# Patient Record
Sex: Female | Born: 1950 | Race: White | Hispanic: No | Marital: Married | Smoking: Never smoker
Health system: Southern US, Community
[De-identification: ages and names within clinical notes are randomized; demographics above are authoritative.]

## PROBLEM LIST (undated history)

## (undated) DIAGNOSIS — R112 Nausea with vomiting, unspecified: Secondary | ICD-10-CM

## (undated) DIAGNOSIS — M1990 Unspecified osteoarthritis, unspecified site: Secondary | ICD-10-CM

## (undated) DIAGNOSIS — Z853 Personal history of malignant neoplasm of breast: Secondary | ICD-10-CM

## (undated) DIAGNOSIS — E785 Hyperlipidemia, unspecified: Secondary | ICD-10-CM

## (undated) DIAGNOSIS — E7800 Pure hypercholesterolemia, unspecified: Secondary | ICD-10-CM

## (undated) DIAGNOSIS — I6521 Occlusion and stenosis of right carotid artery: Secondary | ICD-10-CM

## (undated) DIAGNOSIS — I639 Cerebral infarction, unspecified: Secondary | ICD-10-CM

## (undated) DIAGNOSIS — K603 Anal fistula, unspecified: Secondary | ICD-10-CM

## (undated) DIAGNOSIS — Z87440 Personal history of urinary (tract) infections: Secondary | ICD-10-CM

## (undated) DIAGNOSIS — C50919 Malignant neoplasm of unspecified site of unspecified female breast: Secondary | ICD-10-CM

## (undated) DIAGNOSIS — F419 Anxiety disorder, unspecified: Secondary | ICD-10-CM

## (undated) DIAGNOSIS — I10 Essential (primary) hypertension: Secondary | ICD-10-CM

## (undated) DIAGNOSIS — T4145XA Adverse effect of unspecified anesthetic, initial encounter: Secondary | ICD-10-CM

## (undated) DIAGNOSIS — I2510 Atherosclerotic heart disease of native coronary artery without angina pectoris: Secondary | ICD-10-CM

## (undated) DIAGNOSIS — T8859XA Other complications of anesthesia, initial encounter: Secondary | ICD-10-CM

## (undated) DIAGNOSIS — Z9889 Other specified postprocedural states: Secondary | ICD-10-CM

## (undated) DIAGNOSIS — F32A Depression, unspecified: Secondary | ICD-10-CM

## (undated) DIAGNOSIS — M4800 Spinal stenosis, site unspecified: Secondary | ICD-10-CM

## (undated) DIAGNOSIS — K5900 Constipation, unspecified: Secondary | ICD-10-CM

## (undated) HISTORY — DX: Pure hypercholesterolemia, unspecified: E78.00

## (undated) HISTORY — PX: HEMORROIDECTOMY: SUR656

## (undated) HISTORY — DX: Atherosclerotic heart disease of native coronary artery without angina pectoris: I25.10

## (undated) HISTORY — PX: OTHER SURGICAL HISTORY: SHX169

## (undated) HISTORY — DX: Occlusion and stenosis of right carotid artery: I65.21

## (undated) HISTORY — PX: COLONOSCOPY: SHX174

## (undated) HISTORY — DX: Cerebral infarction, unspecified: I63.9

## (undated) HISTORY — PX: TONSILLECTOMY: SUR1361

## (undated) HISTORY — DX: Essential (primary) hypertension: I10

---
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
## 1999-08-10 ENCOUNTER — Encounter: Admission: RE | Admit: 1999-08-10 | Discharge: 1999-08-10 | Payer: Self-pay | Admitting: Internal Medicine

## 1999-08-10 ENCOUNTER — Encounter: Payer: Self-pay | Admitting: Internal Medicine

## 2000-06-05 ENCOUNTER — Other Ambulatory Visit: Admission: RE | Admit: 2000-06-05 | Discharge: 2000-06-05 | Payer: Self-pay | Admitting: Obstetrics and Gynecology

## 2000-06-05 ENCOUNTER — Encounter (INDEPENDENT_AMBULATORY_CARE_PROVIDER_SITE_OTHER): Payer: Self-pay | Admitting: Specialist

## 2000-07-19 ENCOUNTER — Ambulatory Visit (HOSPITAL_BASED_OUTPATIENT_CLINIC_OR_DEPARTMENT_OTHER): Admission: RE | Admit: 2000-07-19 | Discharge: 2000-07-19 | Payer: Self-pay | Admitting: Orthopedic Surgery

## 2000-07-19 HISTORY — PX: SHOULDER ARTHROSCOPY DISTAL CLAVICLE EXCISION AND OPEN ROTATOR CUFF REPAIR: SHX2396

## 2000-08-23 ENCOUNTER — Encounter: Payer: Self-pay | Admitting: Internal Medicine

## 2000-08-23 ENCOUNTER — Encounter: Admission: RE | Admit: 2000-08-23 | Discharge: 2000-08-23 | Payer: Self-pay | Admitting: Internal Medicine

## 2001-04-25 HISTORY — PX: VAGINAL HYSTERECTOMY: SUR661

## 2001-06-25 ENCOUNTER — Encounter: Payer: Self-pay | Admitting: Obstetrics and Gynecology

## 2001-07-02 ENCOUNTER — Encounter (INDEPENDENT_AMBULATORY_CARE_PROVIDER_SITE_OTHER): Payer: Self-pay | Admitting: Specialist

## 2001-07-02 ENCOUNTER — Inpatient Hospital Stay (HOSPITAL_COMMUNITY): Admission: RE | Admit: 2001-07-02 | Discharge: 2001-07-03 | Payer: Self-pay | Admitting: Obstetrics and Gynecology

## 2001-07-02 HISTORY — PX: LAPAROSCOPIC VAGINAL HYSTERECTOMY WITH SALPINGO OOPHORECTOMY: SHX6681

## 2001-11-01 ENCOUNTER — Encounter: Payer: Self-pay | Admitting: Internal Medicine

## 2001-11-01 ENCOUNTER — Encounter: Admission: RE | Admit: 2001-11-01 | Discharge: 2001-11-01 | Payer: Self-pay | Admitting: Internal Medicine

## 2003-04-26 HISTORY — PX: ROTATOR CUFF REPAIR: SHX139

## 2004-04-02 ENCOUNTER — Encounter: Admission: RE | Admit: 2004-04-02 | Discharge: 2004-04-02 | Payer: Self-pay | Admitting: Orthopedic Surgery

## 2004-04-25 DIAGNOSIS — Z8673 Personal history of transient ischemic attack (TIA), and cerebral infarction without residual deficits: Secondary | ICD-10-CM

## 2004-04-25 HISTORY — DX: Personal history of transient ischemic attack (TIA), and cerebral infarction without residual deficits: Z86.73

## 2005-01-14 ENCOUNTER — Emergency Department (HOSPITAL_COMMUNITY): Admission: EM | Admit: 2005-01-14 | Discharge: 2005-01-14 | Payer: Self-pay | Admitting: Emergency Medicine

## 2005-01-21 ENCOUNTER — Encounter: Admission: RE | Admit: 2005-01-21 | Discharge: 2005-01-21 | Payer: Self-pay | Admitting: Internal Medicine

## 2005-01-28 ENCOUNTER — Ambulatory Visit (HOSPITAL_COMMUNITY): Admission: RE | Admit: 2005-01-28 | Discharge: 2005-01-28 | Payer: Self-pay | Admitting: Vascular Surgery

## 2005-03-14 ENCOUNTER — Encounter: Admission: RE | Admit: 2005-03-14 | Discharge: 2005-03-14 | Payer: Self-pay | Admitting: Internal Medicine

## 2005-04-22 ENCOUNTER — Encounter: Admission: RE | Admit: 2005-04-22 | Discharge: 2005-04-22 | Payer: Self-pay | Admitting: Gastroenterology

## 2005-04-27 ENCOUNTER — Encounter: Admission: RE | Admit: 2005-04-27 | Discharge: 2005-04-27 | Payer: Self-pay | Admitting: Internal Medicine

## 2006-03-03 ENCOUNTER — Ambulatory Visit: Payer: Self-pay | Admitting: Family Medicine

## 2006-05-23 DIAGNOSIS — I10 Essential (primary) hypertension: Secondary | ICD-10-CM | POA: Insufficient documentation

## 2006-05-23 DIAGNOSIS — E0590 Thyrotoxicosis, unspecified without thyrotoxic crisis or storm: Secondary | ICD-10-CM | POA: Insufficient documentation

## 2006-05-23 DIAGNOSIS — F329 Major depressive disorder, single episode, unspecified: Secondary | ICD-10-CM

## 2006-05-23 DIAGNOSIS — B009 Herpesviral infection, unspecified: Secondary | ICD-10-CM | POA: Insufficient documentation

## 2006-05-29 ENCOUNTER — Ambulatory Visit: Payer: Self-pay | Admitting: Family Medicine

## 2006-05-29 ENCOUNTER — Encounter: Admission: RE | Admit: 2006-05-29 | Discharge: 2006-05-29 | Payer: Self-pay | Admitting: Family Medicine

## 2006-05-29 LAB — CONVERTED CEMR LAB
ALT: 22 units/L (ref 0–40)
AST: 24 units/L (ref 0–37)
BUN: 12 mg/dL (ref 6–23)
Basophils Absolute: 0 10*3/uL (ref 0.0–0.1)
Basophils Relative: 0.9 % (ref 0.0–1.0)
CO2: 31 meq/L (ref 19–32)
Calcium: 9.4 mg/dL (ref 8.4–10.5)
Chloride: 108 meq/L (ref 96–112)
Cholesterol: 173 mg/dL (ref 0–200)
Creatinine, Ser: 0.9 mg/dL (ref 0.4–1.2)
Eosinophils Absolute: 0.2 10*3/uL (ref 0.0–0.6)
Eosinophils Relative: 4.1 % (ref 0.0–5.0)
GFR calc Af Amer: 83 mL/min
GFR calc non Af Amer: 68 mL/min
Glucose, Bld: 93 mg/dL (ref 70–99)
HCT: 40.4 % (ref 36.0–46.0)
HDL: 55.7 mg/dL (ref >39.0)
Hemoglobin: 13.9 g/dL (ref 12.0–15.0)
LDL Cholesterol: 93 mg/dL (ref 0–99)
Lymphocytes Relative: 35.9 % (ref 12.0–46.0)
MCHC: 34.4 g/dL (ref 30.0–36.0)
MCV: 90.4 fL (ref 78.0–100.0)
Monocytes Absolute: 0.4 10*3/uL (ref 0.2–0.7)
Monocytes Relative: 7.7 % (ref 3.0–11.0)
Neutro Abs: 2.4 10*3/uL (ref 1.4–7.7)
Neutrophils Relative %: 51.4 % (ref 43.0–77.0)
Platelets: 329 10*3/uL (ref 150–400)
Potassium: 3.7 meq/L (ref 3.5–5.1)
RBC: 4.47 M/uL (ref 3.87–5.11)
RDW: 12.2 % (ref 11.5–14.6)
Sodium: 142 meq/L (ref 135–145)
TSH: 2.59 microintl units/mL (ref 0.35–5.50)
Total CHOL/HDL Ratio: 3.1
Triglycerides: 121 mg/dL (ref 0–149)
VLDL: 24 mg/dL (ref 0–40)
WBC: 4.7 10*3/uL (ref 4.5–10.5)

## 2006-08-28 DIAGNOSIS — M1710 Unilateral primary osteoarthritis, unspecified knee: Secondary | ICD-10-CM | POA: Insufficient documentation

## 2006-08-28 DIAGNOSIS — IMO0002 Reserved for concepts with insufficient information to code with codable children: Secondary | ICD-10-CM | POA: Insufficient documentation

## 2006-08-28 DIAGNOSIS — Z9079 Acquired absence of other genital organ(s): Secondary | ICD-10-CM | POA: Insufficient documentation

## 2006-08-30 ENCOUNTER — Encounter: Payer: Self-pay | Admitting: Family Medicine

## 2006-08-30 ENCOUNTER — Ambulatory Visit: Payer: Self-pay | Admitting: Family Medicine

## 2006-08-30 ENCOUNTER — Encounter (INDEPENDENT_AMBULATORY_CARE_PROVIDER_SITE_OTHER): Payer: Self-pay | Admitting: Family Medicine

## 2006-08-30 DIAGNOSIS — G459 Transient cerebral ischemic attack, unspecified: Secondary | ICD-10-CM | POA: Insufficient documentation

## 2006-08-30 DIAGNOSIS — E785 Hyperlipidemia, unspecified: Secondary | ICD-10-CM | POA: Insufficient documentation

## 2006-12-04 ENCOUNTER — Telehealth (INDEPENDENT_AMBULATORY_CARE_PROVIDER_SITE_OTHER): Payer: Self-pay | Admitting: *Deleted

## 2006-12-07 ENCOUNTER — Ambulatory Visit: Payer: Self-pay | Admitting: Family Medicine

## 2006-12-07 DIAGNOSIS — R0609 Other forms of dyspnea: Secondary | ICD-10-CM | POA: Insufficient documentation

## 2006-12-07 DIAGNOSIS — R0989 Other specified symptoms and signs involving the circulatory and respiratory systems: Secondary | ICD-10-CM

## 2006-12-11 ENCOUNTER — Telehealth (INDEPENDENT_AMBULATORY_CARE_PROVIDER_SITE_OTHER): Payer: Self-pay | Admitting: *Deleted

## 2006-12-11 LAB — CONVERTED CEMR LAB
Basophils Absolute: 0.1 10*3/uL (ref 0.0–0.1)
Basophils Relative: 1.3 % — ABNORMAL HIGH (ref 0.0–1.0)
Eosinophils Absolute: 0.1 10*3/uL (ref 0.0–0.6)
Eosinophils Relative: 1.2 % (ref 0.0–5.0)
HCT: 39.2 % (ref 36.0–46.0)
Hemoglobin: 13.8 g/dL (ref 12.0–15.0)
Lymphocytes Relative: 28.4 % (ref 12.0–46.0)
MCHC: 35.1 g/dL (ref 30.0–36.0)
MCV: 88.1 fL (ref 78.0–100.0)
Monocytes Absolute: 0.5 10*3/uL (ref 0.2–0.7)
Monocytes Relative: 7.8 % (ref 3.0–11.0)
Neutro Abs: 3.8 10*3/uL (ref 1.4–7.7)
Neutrophils Relative %: 61.3 % (ref 43.0–77.0)
Platelets: 307 10*3/uL (ref 150–400)
RBC: 4.45 M/uL (ref 3.87–5.11)
RDW: 12 % (ref 11.5–14.6)
TSH: 2.65 microintl units/mL (ref 0.35–5.50)
WBC: 6.3 10*3/uL (ref 4.5–10.5)

## 2006-12-18 ENCOUNTER — Ambulatory Visit: Payer: Self-pay | Admitting: Internal Medicine

## 2007-01-04 ENCOUNTER — Ambulatory Visit: Payer: Self-pay | Admitting: Cardiology

## 2007-01-16 ENCOUNTER — Encounter: Payer: Self-pay | Admitting: Cardiology

## 2007-01-25 ENCOUNTER — Ambulatory Visit: Payer: Self-pay | Admitting: Internal Medicine

## 2007-02-27 ENCOUNTER — Telehealth (INDEPENDENT_AMBULATORY_CARE_PROVIDER_SITE_OTHER): Payer: Self-pay | Admitting: *Deleted

## 2007-04-30 ENCOUNTER — Telehealth (INDEPENDENT_AMBULATORY_CARE_PROVIDER_SITE_OTHER): Payer: Self-pay | Admitting: *Deleted

## 2007-05-24 ENCOUNTER — Ambulatory Visit: Payer: Self-pay | Admitting: Family Medicine

## 2007-05-25 LAB — CONVERTED CEMR LAB
BUN: 18 mg/dL (ref 6–23)
CO2: 31 meq/L (ref 19–32)
Calcium: 10 mg/dL (ref 8.4–10.5)
Chloride: 104 meq/L (ref 96–112)
Creatinine, Ser: 0.8 mg/dL (ref 0.4–1.2)
Free T4: 0.9 ng/dL (ref 0.6–1.6)
GFR calc Af Amer: 94 mL/min
GFR calc non Af Amer: 78 mL/min
Glucose, Bld: 91 mg/dL (ref 70–99)
Potassium: 4.1 meq/L (ref 3.5–5.1)
Sodium: 142 meq/L (ref 135–145)
T3, Free: 2.4 pg/mL (ref 2.3–4.2)
TSH: 2.07 microintl units/mL (ref 0.35–5.50)

## 2007-05-28 ENCOUNTER — Encounter (INDEPENDENT_AMBULATORY_CARE_PROVIDER_SITE_OTHER): Payer: Self-pay | Admitting: *Deleted

## 2007-05-30 ENCOUNTER — Telehealth (INDEPENDENT_AMBULATORY_CARE_PROVIDER_SITE_OTHER): Payer: Self-pay | Admitting: *Deleted

## 2007-07-18 ENCOUNTER — Encounter: Admission: RE | Admit: 2007-07-18 | Discharge: 2007-07-18 | Payer: Self-pay | Admitting: Family Medicine

## 2007-07-20 ENCOUNTER — Telehealth (INDEPENDENT_AMBULATORY_CARE_PROVIDER_SITE_OTHER): Payer: Self-pay | Admitting: *Deleted

## 2007-08-29 ENCOUNTER — Telehealth (INDEPENDENT_AMBULATORY_CARE_PROVIDER_SITE_OTHER): Payer: Self-pay | Admitting: *Deleted

## 2008-04-25 HISTORY — PX: KNEE ARTHROSCOPY: SUR90

## 2008-05-07 ENCOUNTER — Ambulatory Visit (HOSPITAL_BASED_OUTPATIENT_CLINIC_OR_DEPARTMENT_OTHER): Admission: RE | Admit: 2008-05-07 | Discharge: 2008-05-07 | Payer: Self-pay | Admitting: Orthopedic Surgery

## 2008-05-07 HISTORY — PX: KNEE ARTHROSCOPY: SUR90

## 2009-06-15 ENCOUNTER — Encounter: Admission: RE | Admit: 2009-06-15 | Discharge: 2009-06-15 | Payer: Self-pay | Admitting: Family Medicine

## 2009-09-11 ENCOUNTER — Encounter: Admission: RE | Admit: 2009-09-11 | Discharge: 2009-09-11 | Payer: Self-pay | Admitting: Family Medicine

## 2010-04-23 ENCOUNTER — Emergency Department (HOSPITAL_COMMUNITY)
Admission: EM | Admit: 2010-04-23 | Discharge: 2010-04-23 | Payer: Self-pay | Source: Home / Self Care | Admitting: Emergency Medicine

## 2010-05-16 ENCOUNTER — Encounter: Payer: Self-pay | Admitting: Family Medicine

## 2010-05-16 ENCOUNTER — Encounter: Payer: Self-pay | Admitting: Internal Medicine

## 2010-07-05 ENCOUNTER — Ambulatory Visit (HOSPITAL_COMMUNITY)
Admission: RE | Admit: 2010-07-05 | Discharge: 2010-07-05 | Disposition: A | Discharge status: Home / Self Care | Payer: BC Managed Care – PPO | Source: Ambulatory Visit | Attending: Neurological Surgery | Admitting: Neurological Surgery

## 2010-07-05 ENCOUNTER — Other Ambulatory Visit (HOSPITAL_COMMUNITY): Payer: Self-pay | Admitting: Neurological Surgery

## 2010-07-05 ENCOUNTER — Encounter (HOSPITAL_COMMUNITY)
Admission: RE | Admit: 2010-07-05 | Discharge: 2010-07-05 | Disposition: A | Discharge status: Home / Self Care | Payer: BC Managed Care – PPO | Source: Ambulatory Visit | Attending: Neurological Surgery | Admitting: Neurological Surgery

## 2010-07-05 DIAGNOSIS — Z01811 Encounter for preprocedural respiratory examination: Secondary | ICD-10-CM

## 2010-07-05 DIAGNOSIS — Z01818 Encounter for other preprocedural examination: Secondary | ICD-10-CM | POA: Insufficient documentation

## 2010-07-05 DIAGNOSIS — Z01812 Encounter for preprocedural laboratory examination: Secondary | ICD-10-CM | POA: Insufficient documentation

## 2010-07-05 LAB — CBC
HCT: 38.1 % (ref 36.0–46.0)
Hemoglobin: 13.4 g/dL (ref 12.0–15.0)
MCHC: 35.2 g/dL (ref 30.0–36.0)
RBC: 4.35 MIL/uL (ref 3.87–5.11)

## 2010-07-05 LAB — SURGICAL PCR SCREEN: MRSA, PCR: NEGATIVE

## 2010-07-05 LAB — BASIC METABOLIC PANEL
CO2: 30 mEq/L (ref 19–32)
Chloride: 99 mEq/L (ref 96–112)
GFR calc Af Amer: >60 mL/min (ref >60)
Glucose, Bld: 86 mg/dL (ref 70–99)
Sodium: 138 mEq/L (ref 135–145)

## 2010-07-12 ENCOUNTER — Inpatient Hospital Stay (HOSPITAL_COMMUNITY): Payer: BC Managed Care – PPO

## 2010-07-12 ENCOUNTER — Inpatient Hospital Stay (HOSPITAL_COMMUNITY)
Admission: RE | Admit: 2010-07-12 | Discharge: 2010-07-14 | DRG: 756 | Disposition: A | Discharge status: Home / Self Care | Payer: BC Managed Care – PPO | Source: Ambulatory Visit | Attending: Neurological Surgery | Admitting: Neurological Surgery

## 2010-07-12 DIAGNOSIS — I10 Essential (primary) hypertension: Secondary | ICD-10-CM | POA: Diagnosis present

## 2010-07-12 DIAGNOSIS — Q762 Congenital spondylolisthesis: Secondary | ICD-10-CM

## 2010-07-12 DIAGNOSIS — Z8673 Personal history of transient ischemic attack (TIA), and cerebral infarction without residual deficits: Secondary | ICD-10-CM

## 2010-07-12 DIAGNOSIS — M51379 Other intervertebral disc degeneration, lumbosacral region without mention of lumbar back pain or lower extremity pain: Secondary | ICD-10-CM | POA: Diagnosis present

## 2010-07-12 DIAGNOSIS — M5137 Other intervertebral disc degeneration, lumbosacral region: Secondary | ICD-10-CM | POA: Diagnosis present

## 2010-07-12 DIAGNOSIS — M47817 Spondylosis without myelopathy or radiculopathy, lumbosacral region: Principal | ICD-10-CM | POA: Diagnosis present

## 2010-07-12 HISTORY — PX: BACK SURGERY: SHX140

## 2010-07-12 HISTORY — PX: POSTERIOR FUSION LUMBAR SPINE: SUR632

## 2010-07-12 LAB — TYPE AND SCREEN
ABO/RH(D): O POS
Antibody Screen: NEGATIVE

## 2010-07-12 LAB — ABO/RH: ABO/RH(D): O POS

## 2010-07-13 LAB — BASIC METABOLIC PANEL
CO2: 23 mEq/L (ref 19–32)
Calcium: 8.8 mg/dL (ref 8.4–10.5)
GFR calc Af Amer: >60 mL/min (ref >60)
GFR calc non Af Amer: >60 mL/min (ref >60)
Sodium: 136 mEq/L (ref 135–145)

## 2010-07-13 LAB — CBC
MCHC: 32.8 g/dL (ref 30.0–36.0)
Platelets: 265 10*3/uL (ref 150–400)
RDW: 12.6 % (ref 11.5–15.5)
WBC: 14.1 10*3/uL — ABNORMAL HIGH (ref 4.0–10.5)

## 2010-08-09 LAB — POCT HEMOGLOBIN-HEMACUE: Hemoglobin: 14 g/dL (ref 12.0–15.0)

## 2010-08-09 LAB — BASIC METABOLIC PANEL
GFR calc Af Amer: >60 mL/min (ref >60)
GFR calc non Af Amer: >60 mL/min (ref >60)
Potassium: 4 mEq/L (ref 3.5–5.1)
Sodium: 137 mEq/L (ref 135–145)

## 2010-08-09 NOTE — Op Note (Signed)
NAME:  TIFFNEY, HAUGHTON              ACCOUNT NO.:  [id number]  MEDICAL RECORD NO.:  [id number]           PATIENT TYPE:  I  LOCATION:  3012                         FACILITY:  [facility]  PHYSICIAN:  Stefani Dama, M.D.  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  PROCEDURE:  Laminectomy of L4, decompression of L4-L5 nerve roots with surgery greater than necessary for simple posterior lumbar interbody fusion, posterior lumbar interbody fusion with peak spacers, local autograft, and allograft L4-L5, pedicle screw fixation L4-L5 with posterolateral arthrodesis using local autograft and allograft.  SURGEON:  Stefani Dama, MD  FIRST ASSISTANT:  Cristi Loron, MD  ANESTHESIA:  General endotracheal.  INDICATIONS:  Tammy Cooper is a 60 year old individual, who has had significant back and bilateral lower extremity pain.  She has advanced spondylosis stenosis and spondylolisthesis at the level of L4- L5.  She has evidence of nerve root compromise.  She has been advised regarding need for surgical decompression and stabilization using a posterior interbody technique.  PROCEDURE IN DETAIL:  The patient was brought to the operating room, supine on the stretcher.  After smooth induction of general endotracheal anesthesia, she was turned prone.  The back was prepped with alcohol and DuraPrep, draped in a sterile fashion.  Midline incision was creating and carried down to lumbodorsal fascia, which was opened on either side of midline to expose the interlaminar space at L4-L5 which was identified positively on a radiograph, identifying the spinous processes of L3-L4.  Dissection was  carried out to the transverse process of L4- L5.  These were decorticated and packed off for later usage for grafting.  Laminectomy was created removing the inferior margin lamina arch of L4-L2 and including the entirety of the facets at L4-L5.  The yellow ligament here was thickened, redundant, and this was causing significant compromise of the canal.  This was removed and decompression of the canal was then performed out to the L4 nerve roots which were decompressed from the lateral aspect.  The L5 nerve roots were then identified and decompressed and the passage into the foramen under the L5 laminar arch.  This was done bilaterally and the disk space was then identified.  There was noted be a significant step off of L4-L5.  Disk space was incised first on the left side.  A diskectomy was performed. This allowed some space to use a series of shavers to increase the size of the interspace up to about 10 mm, a trial spacer was placed on the right side while the diskectomy was then completed on the left side. The endplates were then ultimately decorticated using a series of shavers and tooth curettes.  Once the endplates were completely removed from both sides and the diskectomy was completed, the interbody space could be spread apart very easily and 11-mm spacer fit in this interval quite nicely.  Spacer was filled with a  combination of allograft including Vitoss mixed with PureGen and autograft of the patient's own bone.  This was packed into the interspace and two 11-mm spacers were placed in L4-L5.  Care was taken to make sure the L4-L5 nerve roots were well protected during this procedure.  At this point, then pedicle entry sites were chosen at L4-L5 using fluoroscopic guidance, 6.5 x 45 mm screws were placed into L4-L5 and short 35-mm precontoured rods were placed between the screw heads and torqued into neutral locking position.  The lateral gutters which had been previously  decorticated were then packed with the remaining autograft from laminectomy, mixed with allograft which was Vitoss and PureGen.  Once this grafting was completed, hemostasis and the soft tissues was obtained meticulously. The pads of the L4-L5 nerve roots were checked to make sure these were free and clear, and then the lumbodorsal fascia was closed with #1 Vicryl, 2-0 Vicryl using subcutaneous tissues, and 3-0 Vicryl subcuticularly.  Blood loss was made about 250 mL.  The patient tolerated the procedure well.     Stefani Dama, M.D.     Merla Riches  D:  07/12/2010  T:  07/13/2010  Job:  [id number]  Electronically Signed by Barnett Abu M.D. on 08/09/2010 10:13:09 AM

## 2010-09-07 NOTE — Assessment & Plan Note (Signed)
[facility]                             PULMONARY OFFICE NOTE   NAME:SMITH, Rise Traeger)               MRN:          [id number]  DATE:12/18/2006                            DOB:          [birth year]    REASON FOR CONSULTATION:  Dyspnea.   HISTORY:  This is a 60 year old white female never-smoker with paroxysms  of dyspnea that occur pretty much every day and resolve over several  seconds.  They occur more likely when she is sitting still than when she  is moving, and do not really have any pattern.  They are associated with  minimal hoarseness, but no cough, overt sinus or reflux symptoms, and  have been going on now for several months.  She says she has never had  anything like this and denies any history of asthma or rhinitis,  seasonal or otherwise.   PAST MEDICAL HISTORY:  Significant for hypertension, hyperlipidemia, and  remote hysterectomy.   ALLERGIES:  None known.   MEDICATIONS:  Estradiol.  Lipitor.  Lisinopril.  Aspirin.   SOCIAL HISTORY:  She has never smoked.  She works as an Programmer, systems,  Designer, multimedia in fact.   FAMILY HISTORY:  Positive for lung cancer in her father.   REVIEW OF SYSTEMS:  Taken in detail on the worksheet.  Negative, except  as outlined above.   PHYSICAL EXAMINATION:  This is an ambulatory, pleasant white female with  classic voice fatigue.  VITAL SIGNS:  Stable vital signs.  HEENT:  Unremarkable.  Nasal turbinates normal.  Ear canals clear  bilaterally.  NECK:  Supple without cervical adenopathy or tenderness. Trachea is  midline.  No thyromegaly.  LUNGS:  The lung fields are perfectly clear bilaterally to auscultation  and percussion.  She does have trace pseudo-wheeze.  CARDIAC:  Regular rate and rhythm without murmur, gallop, or rub.  ABDOMEN:  Soft and benign.  EXTREMITIES:  Warm without calf tenderness, cyanosis, clubbing, or  edema.   Chest x-ray suggested decreased lung volumes,  otherwise normal, dated  December 07, 2006.   IMPRESSION:  Classic upper airways dysfunction, probably related to ACE  inhibitors and manifested by voice fatigue and pseudo-wheeze on exam.  Recommendation is elimination of ACE inhibitors while on a  gastroesophageal reflux disease diet (hold off on giving her PPI for now  until we see to what extent the ACE inhibitor related) and substitute  Micardis 40/12.5 one daily before returning here for followup lung  function test.   I note that the patient is concerned about her chest x-ray.  She had low  lung volumes, but I doubt that this is anything more than an artifact.  Will check her lung volumes when she returns and, in the meantime, I  asked her to begin to do paced exercise working up to the point where  she is short of breath, but not out of breath 30 minutes daily.  This  should eliminate deconditioning from the differential of reproducible  dyspnea with exertion, which she has, but is not as significant as the  resting symptoms she is concerned about.     Charlaine Dalton.  Sherene Sires, MD, [facility]  Electronically Signed    MBW/MedQ  DD: 12/18/2006  DT: 12/19/2006  Job #: [id number]   cc:   Leanne Chang, M.D.

## 2010-09-07 NOTE — Assessment & Plan Note (Signed)
[facility]                             PULMONARY OFFICE NOTE   ARYONA, SILL                 MRN:          [id number]  DATE:01/25/2007                            DOB:          04-07-1951    HISTORY:  A 60 year old female with paroxysms of dyspnea at rest and  also dyspnea with exertion returns for follow-up having been switched  from Lisinopril to Micardis 40/12.5 one daily.  She returns as requested  for PFT's having undergone also a cardiac evaluation that showed no  evidence of a cardiac cause for dyspnea on a study done on September 23.   She denies any exertional chest pain with orthopnea, PND, or leg  swelling.  There is significant variability of her symptoms which are  reproducible with exertion, but then paroxysmal at rest with no pattern  there either, except they do not occur nocturnally.   PHYSICAL EXAMINATION:  GENERAL:  She is a pleasant, ambulatory, white  female in no acute distress.  VITAL SIGNS:  Stable.  HEENT:  Unremarkable.  Pharynx clear.  LUNGS:  Perfectly clear to auscultation and percussion bilaterally.  ABDOMEN:  Soft and benign.  EXTREMITIES:  Without calf tenderness, cyanosis, clubbing, or edema.   PFT's are reviewed from today and are normal except for a disproportion  reduction expiratory reserve volume.   IMPRESSION:  Chronic dyspnea on exertion is likely secondary to obesity  with deconditioning based on the reduction in expiratory reserve volume.  I gave her some tips on how to recondition herself using a calorie  balance sheet to help her also try to attain a target weight of less  than 163 pounds.  If she is failing to improve her activity tolerance or  drop her weight, I might recommend that she have a TSH if not already  done.   In terms of her paroxysms of dyspnea at rest, I believe these are not  consistent with asthma, and more consistent with anxiety because they  come and go with no  pattern except they are not present during sleep.  I  also referred her back to Dr. Blossom Hoops for this and do not feel that it  is likely that the ACE inhibitor has anything to do with the problem  (although technically a six-week washout period is required to make  certain about this).   I gave her four weeks of Micardis 40/12.5 samples therefore and referred  her back to Dr. Blossom Hoops.  I would certainly be happy to see her on a  p.r.n. basis.  I should say that she has mild dyspnea that is  reproducible with exertion.     Charlaine Dalton. Sherene Sires, MD, [facility]  Electronically Signed    MBW/MedQ  DD: 01/25/2007  DT: 01/26/2007  Job #: [id number]   cc:   Leanne Chang, M.D.

## 2010-09-07 NOTE — Assessment & Plan Note (Signed)
[facility]                            CARDIOLOGY OFFICE NOTE   RIYAH, BARDON                 MRN:          [id number]  DATE:01/04/2007                            DOB:          [birth year]    Mrs. Tammy Cooper is a very pleasant 60 year old female who we were asked to  evaluate for dyspnea.   The patient has no prior cardiac history. She typically does not have  dyspnea on exertion or orthopnea or PND or pedal edema or palpitations  or pre-syncope or syncope or exertional chest pain. However, over the  past several months she noticed an occasional sensation of dyspnea. This  is not exertional. It lasts for several seconds and resolves  spontaneously. She wonders whether it may be anxiety. There is no  associated chest pain, palpitations or nausea. Note, she was seen by Dr.  Sherene Sires recently and her ACE inhibitor was changed to an ARB for the  possibility of this causing this. Also, note that she feels like she is  overweight and plans on implementing an exercise program. Because of her  dyspnea, we were asked to further evaluate.   CURRENT MEDICATIONS:  1. Estradiol 0.5 mg p.o. daily.  2. Lipitor 40 mg p.o. daily.  3. Aspirin 325 mg p.o. daily.  4. Micardis 40/12.5 mg p.o. daily.   ALLERGIES:  No known drug allergies.   SOCIAL HISTORY:  She does not smoke. She occasionally consumes alcohol.   FAMILY HISTORY:  Is negative for coronary artery disease, although there  is hypertension.   PAST MEDICAL HISTORY:  1. Hypertension.  2. Hyperlipidemia.  3. There is no diabetes mellitus.  4. She has had a prior hysterectomy, rotator cuff surgery on the right      and tonsillectomy.   REVIEW OF SYSTEMS:  She denies any headaches, fevers or chills. There is  no productive cough or hemoptysis. There is no dysphagia, odynophagia,  melena or hematochezia. There is no dysuria, hematuria. There is no rash  or seizure activity. There is no orthopnea, PND  or pedal edema. The  remainder of review of systems are negative. Note, she has minimal  nausea in the mornings, but this is not related to her dyspnea.   PHYSICAL EXAMINATION:  She weighs 180 pounds. Her blood pressure is  120/80, pulse 57. She is well-developed, somewhat obese. She is in no  acute distress.  SKIN: Warm and dry.  She does not appear to be depressed.  There is no peripheral clubbing.  Her back is normal.  HEENT: Is unremarkable with normal eyelids.  NECK: Supple, with a normal upstroke bilaterally. Cannot appreciate  bruits. There is no jugular venous distention and no thyromegaly is  noted.  CHEST: Clear to auscultation, normal expansion.  CARDIOVASCULAR: Reveals a regular rate and rhythm, normal S1, S2. There  are no murmurs, rubs or gallops noted. There is no change with Valsalva.  PMI is not palpated.  ABDOMEN: Nontender. Positive bowel sounds. No hepatosplenomegaly. No  masses appreciated. There is no abdominal bruit.  She has 2+ femoral pulses bilaterally. No bruits.  EXTREMITIES: Show no edema.  I  can palpate no cords. She has 2+  posterior tibial pulses bilaterally.  NEUROLOGIC: Examination is grossly intact.   An electrocardiogram today shows a sinus rhythm at a rate of 57. There  are no significant ST changes.   DIAGNOSES:  1. Dyspnea. The etiology of this is not clear to me, although I doubt      cardiac etiology. Her electrocardiogram is normal and she does not      have significant dyspnea on exertion. However, she is contemplating      beginning an exercise program and is also concerned about her      dyspnea. We will schedule her to have a stress echocardiogram. If      it shows no abnormalities then we will not pursue further cardiac      evaluation.  2. Hypertension. She will continue on her Micardis and her blood      pressure is well-controlled today.  3. Hyperlipidemia. She will continue on her statin and this is being      followed by Dr.   Blossom Hoops.   We will see her back on an as needed basis pending the results of her  stress echocardiogram.     Madolyn Frieze. Jens Som, MD, [facility]  Electronically Signed    BSC/MedQ  DD: 01/04/2007  DT: 01/04/2007  Job #: [id number]   cc:   Leanne Chang, M.D.

## 2010-09-07 NOTE — Op Note (Signed)
NAME:  Tammy Cooper, Tammy Cooper              ACCOUNT NO.:  [id number]   MEDICAL RECORD NO.:  [id number]          PATIENT TYPE:  AMB   LOCATION:  DSC                          FACILITY:  [facility]   PHYSICIAN:  Harvie Junior, M.D.   DATE OF BIRTH:  03-09-1951   DATE OF PROCEDURE:  05/07/2008  DATE OF DISCHARGE:                               OPERATIVE REPORT   PREOPERATIVE DIAGNOSIS:  Medial meniscal tear with chondromalacia.   POSTOPERATIVE DIAGNOSES:  1. 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  2. Lateral femoral condyle chondromalacia.  3. 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   PROCEDURES:  1. Knee arthroscopy with partial posterior horn medial meniscotomy and      coarse and fine debridement of the medial compartment.  2. Chondroplasty of the lateral femoral condyle.  3. Chondroplasty of the patellofemoral joint including both, the      patella and the patellofemoral trochlea.   SURGEON:  Harvie Junior, MD   ASSISTANT:  Marshia Ly, PA   ANESTHESIA:  General.   BRIEF HISTORY:  [name] is a 60 year old female with long history of  having had significant left knee pain, we treated conservatively for a  period of time.  Activity modification, injection, therapy,  antiinflammatory medication, all of this failed and subsequently  complains of pain and intermittent grabbing and catching pain.  She was  ultimately taken to the operating room for operative knee arthroscopy.  Preoperative x-rays did not show bone-on-bone changes but certainly had  narrowing in the median and lateral compartment.  We felt that we could  buy her some time with knee arthroscopy.  She was brought to the  operating room for this procedure.   PROCEDURE:  The patient was brought to the operating room.  After  adequate anesthesia obtained with general anesthetic, the patient was  placed supine on the operating table.  Her  left leg was prepped and  draped in usual sterile fashion.  Following this, routine arthroscopic  examination of the knee revealed there was an obvious posterior horn  medial meniscal tear and this was debrided back to a smooth and stable  rim.  Once this was completed, the attention was turned to the femoral  condyle which had some grade 2 changes, which were debrided.  Attention  was turned to the notch where the ACL and PCL were intact although with  some fairly significant amount of notch osteophyte.  Attention was  turned laterally where the lateral meniscus was intact, but the lateral  femoral condyle showed some fairly significant chondromalacia with  flattening of the cartilage in the flexion position.  This was debrided  back to a smooth and stable rim, which did expose some bone on the  lateral femoral condyle.  Attention was then turned to the  patellofemoral joint, where the patellofemoral joint had significant  grade 2 and grade 3 chondromalacia, patellofemoral chondroplasty was  performed.  In the lateral compartment, the chondroplasty was done to  the patellofemoral compartment and not to the bleeding  bone.  At this  point, the knee was copiously and thoroughly  irrigated with 6 L of normal saline irrigation and suctioned dry.  All  sterile portals were closed with a bandage.  Sterile compression  dressing was applied.  The patient was taken to the recovery room and  was noted to be in satisfactory condition.  Estimated blood loss for  this procedure was none.      Harvie Junior, M.D.  Electronically Signed     JLG/MEDQ  D:  05/07/2008  T:  05/08/2008  Job:  [id number]

## 2010-09-10 NOTE — Op Note (Signed)
[place]  Patient:    Tammy Cooper, Tammy Cooper                 MRN: [id number] Proc. Date: 07/19/00 Adm. Date:  [id number] Attending:  Georgena Spurling                           Operative Report  PREOPERATIVE DIAGNOSIS:  Right shoulder rotator cuff repair.  POSTOPERATIVE DIAGNOSIS:  Right shoulder rotator cuff repair.  OPERATION PERFORMED:  Right rotator cuff repair, shoulder arthroscopy subacromial decompression.  SURGEON:  Georgena Spurling, M.D.  ASSISTANT:  Arnoldo Morale, P.A.  ANESTHESIA:  Interscalene plus general.  INDICATIONS FOR PROCEDURE:  The patient is a 60 year old white female with MRI evidence of rotator cuff repair and after informed consent was obtained, she was taken to the operating room.  DESCRIPTION OF PROCEDURE:  The patient was laid supine and administered general endotracheal anesthesia after being administered interscalene block in the preanesthesia holding area.  She was placed in the beach chair position and the right shoulder was prepped and draped in the usual sterile fashion.  A posterior portal was made with a #11 blade, blunt trocar and cannula.  There was some minor subscapulary spurring and undersurface tearing of the rotator cuff going into the glenohumeral joint causing some impingement, so an anterior portal was created with an outside in technique, #11 blade, blunt trocar and cannula.  4-2 gator shaver was used to remove this debris.  We then went from posterior into the subacromial space and made a straight lateral portal into the subacromial space performing a subtotal bursectomy.  We then used the 4-0 cylindrical bur to perform an anterolateral acromioplasty.  We then used the Arthrocare wand to release the CA ligament and further debride the bursa.  I then used the 4-2 gator shaver to debride the torn rotator cuff and then the 4-0 cylindrical bur to prepare a bony bed.  At this point the tear was a little too  large to do in good time with the arthroscopic technique, so I made a 2.5 cm incision which basically was an extension of the straight lateral portal to the anterolateral corner of the acromion, split the deltoid along its raphe, placed a self-retaining retractor in place.  I then placed two Bioabsorbable Arthrex corkscrew anchors with double suture on them, used the Viper device to place vertical mattress sutures in both the anterior portion and the posterior portion and tied them down without any tension at all.  Then I irrigated the wound, closed the deltoid interval with interrupted 0 Vicryls, the subcuticular layer with interrupted 2-0 Vicryls and a 3-0 running Prolene.   Closed both portals with 3-0 Prolene.  I dressed the wound with Adaptic, 4 x 4s, ABDs tape and a sling and swath.  COMPLICATIONS:  None.  DRAINS:  None.  ESTIMATED BLOOD LOSS:  Approximately 100 cc. DD:  07/19/00 TD:  07/19/00 Job: [id number]

## 2010-09-10 NOTE — Op Note (Signed)
NAMEJOVONNA, NICKELL              ACCOUNT NO.:  [id number]   MEDICAL RECORD NO.:  [id number]          PATIENT TYPE:  AMB   LOCATION:  SDS                          FACILITY:  [facility]   PHYSICIAN:  Larina Earthly, M.D.    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   POSTOPERATIVE DIAGNOSIS:  Normal left common carotid artery.   PROCEDURE:  Arch and cerebral arteriogram.   SURGEON:  Gretta Began, M.D.   ANESTHESIA:  1% lidocaine local.   COMPLICATIONS:  None.   DISPOSITION:  To holding area, stable.   INDICATIONS FOR PROCEDURE:  [name] is status post an episode several  weeks prior of thick-tongued speech and mild confusion, and unsteady gait.  She underwent MRI which showed no evidence of intracranial disease, did  undergo an MRA which showed stenosis and irregular flow in her mid left  common carotid artery.  I discussed this at length with [name] and her  husband and I explained that there are frequently false-positives with MRA  and would recommend arteriography for definitive study to rule out any  evidence of extracranial cerebrovascular occlusive disease that could have  caused this episode.   PROCEDURE IN DETAIL:  The patient was taken to the peripheral vascular cath  lab and placed in supine position where the area of both groins was prepped  and draped usual sterile fashion.  Using local anesthesia and a single-wall  puncture, the right common femoral artery was entered and a guidewire was  passed up the level of the arch.  A 5-French sheath was passed over the  guidewire and a pigtail catheter positioned at the level of the ascending  arch.  A 35-degree LAO projection was undertaken; this revealed completely  normal great vessels with no evidence of stenosis in the innominate, the  left common or  left subclavian arteries.  The patient had large vertebral  arteries bilaterally with no evidence of stenoses on the arch injection.  Next, using a headhunter catheter, the innominate and then the right carotid  artery were accessed; this revealed totally normal external, internal and  common carotid artery on the right.  Intracranial views were also obtained  and these will be dictated as a separate note by [facility].  Next, a Simmons catheter was used to selectively catheterize the left common  carotid artery.  Oblique AP and lateral views of this were undertaking,  again showing totally normal left common carotid artery.  Again,  intracranial views were obtained and these will be dictated as a separate  note by [facility].  The patient tolerated the procedure without  immediate complication and was transferred to the holding area in stable  condition with no neurologic changes.   FINDINGS:  1.  Normal arch arteriogram with normal great vessels.  2.  Normal common, internal and external carotid arteries bilaterally.  3.  Widely patent vertebral arteries bilaterally.  Larina Earthly, M.D.  Electronically Signed     TFE/MEDQ  D:  02/02/2005  T:  02/02/2005  Job:  [id number]

## 2010-09-10 NOTE — Consult Note (Signed)
NAME:  Tammy Cooper, Tammy Cooper              ACCOUNT NO.:  [id number]   MEDICAL RECORD NO.:  [id number]          PATIENT TYPE:  AMB   LOCATION:  SDS                          FACILITY:  [facility]   PHYSICIAN:  Marin Roberts, MDDATE OF BIRTH:  1950-07-09   DATE OF CONSULTATION:  01/28/2005  DATE OF DISCHARGE:  01/28/2005                                   CONSULTATION   RADIOLOGY CONSULTATION:   DATE OF CONSULTATION:  January 28, 2005   INDICATION:  Cerebral angiogram.   Thank you for sending the images of Ms. Tammy Cooper for consultation.  I  have reviewed the intracranial components of both the right and left common  carotid injections.  These reveal no focal stenosis, aneurysm or branch  vessel occlusion.  There is a fetal type right posterior cerebral artery.  The anterior cerebral arteries beyond the anterior communicating artery do  not fill significantly during the right injection.  On the left the  posterior communicating artery is not seen.  Both  A2 segments fill from a  patent anterior communicating artery.   IMPRESSION:  Normal variant bilateral anterior circulation angiogram.   Thank you again for sending these images for consultation.           ______________________________  Marin Roberts, MD     CM/MEDQ  D:  01/28/2005  T:  01/29/2005  Job:  [id number]

## 2010-09-10 NOTE — H&P (Signed)
[facility]  Patient:    Tammy Cooper, Tammy Cooper Visit Number: [id number] MRN: [id number]          Service Type: Attending:  Alvino Chapel, M.D. Dictated by:   Alvino Chapel, M.D. Adm. Date:  07/02/01                           History and Physical  HISTORY OF PRESENT ILLNESS:  The patient is a 60 year old G2, P2 who is admitted for a scheduled laparoscopic-assisted vaginal hysterectomy, bilateral salpingo-oophorectomy, A&P repair, and placement of tension-free vaginal tape. The patient reports an ongoing problem with irregular menstrual cycles for about two to three years.  The patient had been tried on multiple medical regimens and had no relief from bleeding, which occurred approximately every 14 days.  She did undergo an endometrial biopsy at another physician which was normal and a saline infusion which was essentially normal with no fibroids noted.  The patient also reports an ongoing problem with symptoms of stress urinary incontinence.  She states that she must wear a pad daily and leaks at least two tablespoons with every cough, sneeze, or exertion.  She does have nocturia one to two times a night, and walking increases her leakage and urgency.  Also, the patient is quite concerned regarding leaking urine after sexual intercourse.  For these reasons, she wishes to proceed with definitive surgical therapy of both her abnormal uterine bleeding and her stress urinary incontinence.  PAST MEDICAL HISTORY: 1. Chronic hypertension. 2. Hypercholesterolemia.  PAST SURGICAL HISTORY: 1. Tonsillectomy in 1958. 2. Rotator cuff surgery in 2002.  ALLERGIES:  None.  MEDICATIONS:  Lisinopril and currently Alesse birth control pills.  GYNECOLOGICAL HISTORY:  No abnormal Pap smears.  Her menstrual history is as above with irregular bleeding sometimes lasting two weeks at times.  OBSTETRICAL HISTORY:  Two vaginal deliveries, the largest  being 9 pounds.  SOCIAL HISTORY:  She teaches kindergarten, and her urinary incontinence is causing some difficulty with this job at work.  PHYSICAL EXAMINATION:  VITAL SIGNS:  Height 5 feet 3 inches, weight 161 pounds.  Blood pressure 142/80.  HEART:  Regular rate and rhythm.  LUNGS:  Clear.  ABDOMEN:  Soft and nontender.  GENITOURINARY:  Normal external genitalia noted.  There is a positive cystocele noted with Valsalva, and a normal sized uterus with some slight prolapse noted.  Also, a small rectocele is noted.  PLAN:  Prior to surgery the patient received formal urodynamic study and evaluation by Dr. Ihor Gully, who agreed with the diagnosis of pure stress urinary incontinence and felt that she would respond well to placement of tension-free vaginal tape at the same time of her hysterectomy.  The patient also was counseled as to the possible options regarding removal of her ovaries and would like to have them definitely removed.  Given the small amount of descensus of her uterus, I have counseled that it would be best to proceed with a laparoscopic-assisted vaginal hysterectomy to insure easy removal of the ovaries in case they should prove difficult from a vaginal approach.  She was also counseled as to the risks and benefits of a hysterectomy in general including bleeding, infection, possible damage to both bowel and bladder.  The patient understands these risks and agrees to proceed.  She also understands possible risk of an open procedure should there be any difficulty performing the hysterectomy vaginally. Dictated by:   Alvino Chapel, M.D. Attending:  Alvino Chapel, M.D. DD:  06/28/01 TD:  06/29/01 Job: [id number]

## 2010-09-10 NOTE — Discharge Summary (Signed)
[facility]  Patient:    Tammy Cooper, Tammy Cooper Visit Number: [id number] MRN: [id number]          Service Type: GYN Location: 4W 0466 01 Attending Physician:  Oliver Pila Dictated by:   Alvino Chapel, M.D. Admit Date:  07/02/2001 Discharge Date: 07/03/2001                             Discharge Summary  DISCHARGE DIAGNOSES: 1. Abnormal uterine bleeding. 2. Uterine prolapse. 3. Stress urinary incontinence with cystocele. 4. Rectocele. 5. Status post laparoscopic assisted vaginal hysterectomy, bilateral    salpingo-oophorectomy. 6. Anterior and posterior repair. 7. Tension-free vaginal tape by Dr. Vernie Ammons.  DISCHARGE MEDICATIONS: 1. Motrin 600 mg p.o. every 6 hours p.r.n. 2. Percocet one to two tablets p.o. every 4 hours p.r.n. 3. Cipro 500 mg p.o. b.i.d. for seven days.  HOSPITAL COURSE:  Patient is a 60 year old, G2, P2, who was admitted for scheduled laparoscopic-assisted vaginal hysterectomy, bilateral salpingo-oophorectomy, A&P repair, and placement of a tension-free vaginal tape.  Patient underwent the surgery without difficulty on July 02, 2001, and was admitted for routine postoperative care.  She did very well and, postoperatively, the only significant finding was some hypotension with a blood pressure down to 80/40.  However, at this point, patient was asymptomatic with a pulse of 60 and good urine output.  She had a normal hemoglobin on postoperative day #1 of 11 and, again, had good urine output. the patients hypotension was felt secondary to her PCA which was discontinued and, at which point, her blood pressure significantly improved throughout the day to the 90 to 100/60 range.  By the end of postoperative day #1, she was tolerating a regular diet, passing flatus, voiding without difficulty, and her pain was well controlled.  She was therefore felt stable for discharge to home and was discharged with medications as  previously stated.  Patient will follow up with our office in three to four weeks and with Dr. Vernie Ammons in approximately one week. Dictated by:   Alvino Chapel, M.D. Attending Physician:  Oliver Pila DD:  07/03/01 TD:  07/05/01 Job: [phone]

## 2010-09-10 NOTE — Op Note (Signed)
[facility]  Patient:    Tammy Cooper, RHYNE Visit Number: [id number] MRN: [id number]          Service Type: Attending:  Veverly Fells. Vernie Ammons, M.D. Dictated by:   Veverly Fells Vernie Ammons, M.D. Proc. Date: 07/02/01                             Operative Report  PREOPERATIVE DIAGNOSIS:  Stress urinary incontinence.  POSTOPERATIVE DIAGNOSIS:  Stress urinary incontinence.  PROCEDURE:  Tension-free vaginal tape.  SURGEON:  Mark C. Vernie Ammons, M.D.  ANESTHESIA:  General.  ESTIMATED BLOOD LOSS:  Less than 5 cc.  DRAINS:  16-French Foley catheter.  SPECIMENS:  None.  COMPLICATIONS:  None.  INDICATIONS:  Patient is a 60 year old white female with a history of stress urinary incontinence that has progressively worsened occurring coughing, sneezing, and very minimal provocation. She changes her pads a couple of times a day. 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 She has no real urgency. 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 Leak point pressure determination in the supine position was not demonstrated. The procedure, its risks and complications, as well as alternatives were discussed with the patient. She understands and has elected to proceed.  DESCRIPTION OF PROCEDURE:  After informed consent, the patient was brought to the main OR and placed on the table and administered general anesthesia and then underwent laparoscopically transvaginal hysterectomy and bilateral oophorectomy by Dr. Senaida Ores, which will be dictated by her separately. Dr. Senaida Ores then prepared the vaginal incision by making midline vaginal incision and beginning the anterior repair. I then made separate stab incisions approximately three fingerbreadths apart just above the symphysis pubis and drained  the bladder completely. I felt the mid urethral region and then placed a cystoscope sheath within the bladder. With the bladder completely empty, the passing needles were then passed through the suprapubic incision and down behind the symphysis pubis and then out next to the mid urethral region. This was performed on first left then right side.  Cystoscopy was then performed with 70 degree lens. The bladder was noted to be free of any tumor, stones, or inflammatory lesions. No injuries were identified nor other abnormalities of the bladder. Ureteral orifices were of normal configuration and position. Clear efflux was noted from each. I then drained the bladder and removed the cystoscope. I fixed the tape to each of the passing needles and withdrew this back up through the suprapubic incision. I then inserted a 16-French Foley catheter and placed a sound beneath the urethra, placed mild tension on the tape, and then noted with removal of the sound, the tape was in good position with no tension but no excessive slack was noted in the tape either. Therefore, I divided the tape flush with the skin and closed the skin incisions with Dermabond. Prior to closing the skin incisions, both suprapubic sites were irrigated with antibiotic solution and the vaginal tape region was irrigated with antibiotic solution.  Dr. Senaida Ores then completed the cystocele repair and closed the vaginal mucosa over the tape. Details of this are dictated by her separately. The patient tolerated the procedure well with no intraoperative complications. Dictated by:   Veverly Fells Vernie Ammons, M.D. Attending:  Veverly Fells. Vernie Ammons, M.D. DD:  07/02/01 TD:  07/02/01 Job: [id number]  [id number]

## 2010-09-10 NOTE — Op Note (Signed)
[facility]  Patient:    Tammy Cooper, FANO Visit Number: [id number] MRN: [id number]          Service Type: GYN Location: 4W 0456 02 Attending Physician:  Oliver Pila Dictated by:   Huel Cote, M.D. Proc. Date: 07/02/01 Admit Date:  07/02/2001                             Operative Report  PREOPERATIVE DIAGNOSES: 1. Abnormal uterine bleeding. 2. Uterine prolapse. 3. Stress urinary incontinence with cystocele. 4. Rectocele.  POSTOPERATIVE DIAGNOSES: 1. Abnormal uterine bleeding. 2. Uterine prolapse. 3. Stress urinary incontinence with cystocele. 4. Rectocele.  PROCEDURE:  Laparoscopic-assisted vaginal hysterectomy, bilateral salpingo-oophorectomy, anterior-posterior repair, and placement of tension-free vaginal tape by Dr. Ihor Gully.  SURGEON: 1. Huel Cote, M.D. 2. Loraine Leriche C. Vernie Ammons, M.D.  ASSISTANT:  Malachi Pro. Ambrose Mantle, M.D.  ANESTHESIA:  General.  ESTIMATED BLOOD LOSS:  175 cc.  URINE OUTPUT:  500 cc clear urine.  IV FLUIDS:  2700 cc crystalloid.  PATHOLOGY:  Uterus, tubes, and ovaries were sent.  FINDINGS:  There was a normal size uterus.  The ovaries appeared normal with one simple cyst approximately 1.5 cm in diameter on the right ovary. Otherwise, the pelvis and abdomen were within normal limits.  There was a moderate cystocele and a mild rectocele present.  PROCEDURE:  The patient was taken to the operating room where a general anesthesia was obtained without difficulty.  She was then prepped and draped in the normal sterile fashion in the dorsal lithotomy position.  A speculum was placed within the vagina and the cervix identified and a Hulka tenaculum placed within for uterine manipulation.  Also, a Foley catheter was placed for drainage throughout the procedure.  Attention was then turned to the patients abdomen where a small infraumbilical incision was made with a scalpel.  The Veress needle was  then introduced into this incision and pneumoperitoneum obtained with approximately 2.5 liters of CO2 gas.  The Veress needle was then removed and the 10-11 trocar placed within the incision and a camera introduced.  Intraperitoneal placement was confirmed, and the pelvis and abdomen were inspected with findings as previously stated.  Two additional ports were then placed in each lower quadrant under direct visualization, 5 mm in size.  The atraumatic grasper was then utilized to grasp the patients left ovary and fallopian tube and expose the infundibulopelvic ligament.  This was then taken down with the bipolar cutting cautery unit in sequential steps with good coagulation noted at each step.  This was continued down through the remainder of the broad ligament and the round ligament down to the bladder flap.  The bladder flap was then started and successfully cauterized and cut without difficulty with the cutting cautery unit.  In a similar fashion, the patients right ovary and fallopian tube were atraumatically grasped with the bipolar cutting cautery unit utilized to sequentially coagulate and cut the infundibulopelvic ligament, the broad ligament, and the round ligament.  This was then taken across the bladder flap for one continuous sweep.  The bladder was then successfully pushed away from the lower uterine segment and cervix. No active bleeding was noted, and attention was then turned to the patients vagina where a weighted speculum was placed within the vagina.  The cervix was identified and grasped with [place] tenaculums and circumferentially injected with a dilute solution of Pitressin with good blanching noted.  The Bovie cautery was  then utilized to circumferentially score the cervix and the Mayo scissors then utilized to further dissect the vaginal mucosa off the underlying cervix tissue itself.  The posterior cul-de-sac was then entered with the Mayo scissors without  difficulty and the Bonnano speculum placed within it.  The uterosacral ligaments were then taken down bilaterally with slightly curved Zeppelin clamps and secured with suture ligature of 0 Vicryl bilaterally.  The anterior cul-de-sac was then entered, both sharply and with blunt dissection and the Sims retractor placed within the anterior cul-de-sac Thus, with the uterus isolated from the bladder and the rectum, the slightly curved Zeppelin clamps were utilized to sequentially clamp the cardinal ligament bilaterally.  Each step was sequentially cut and suture ligated with 0 Vicryl.  This was carried up for several bites, and then the uterus was successfully flipped with very little remaining attachment which was clamped with a slightly curved Zeppelin clamp on the patients left.  On the right, the uterus, ovary, and tube were free.  Thus, the uterus was completely amputated, handed off to pathology, and the one remaining section of broad ligament was tied off with a 0 Vicryl free tie.  There was no active bleeding noted at this point.  Therefore, the peritoneum was closed with 2-0 Vicryl and pursestring suture, and the uterosacral ligaments were approximated to one another for added support.  Attention was then turned to the anterior vaginal wall which was grasped with Allis clamps at the vaginal cuff.  The vaginal mucosa was carefully dissected from the underlying pubovesical fascia with the Metzenbaum scissors up along the midline of the anterior vaginal wall.  At each step, Allis clamps were placed on the retracting mucosal flaps to allow better visualization.  This was carried up to approximately 1 cm below the urethra and by both blunt dissection and sharp dissection with the Metzenbaum scissors, the pubovesical fascia was carefully peeled away from the mucosa and taken down to the level of the bladder.  This was accomplished bilaterally and the cystocele isolated well.  Dr. Ihor Gully then was called and proceed to place the tension-free vaginal tape.  For details of his procedure, please see  his dictated operative note.  After the completion of the placement of the tension-free vaginal tape, the anterior repair was completed with several Kelly plication sutures of 0 Vicryl to reapproximate the pubovesical fascia at the midline.  When the cystocele was nicely reduced, the excess vaginal mucosa was trimmed away with Mayo scissors and closed with 2-0 Vicryl in a running lock suture.  The vaginal cuff was then also closed with 2-0 Vicryl in a running lock suture and no active bleeding noted at this point.  Attention was then turned to the posterior wall of the vagina where at the introitus, it was grasped with two Allis clamps.  A small area of vaginal mucosa was trimmed away with the Mayo scissors and the Metzenbaum scissors then utilized to underscore the vaginal mucosa and dissect it away from the underlying rectovaginal fascia.  This was carried up approximately 3 inches, and the underlying rectovaginal fascia was trimmed away with both sharp dissection and blunt dissection and was successfully removed from the vaginal mucosa.  At this point, several interrupted sutures of 0 Vicryl were placed to reapproximate this rectovaginal fascia at the midline.  This was completed down to the level of the introitus and then the excess vaginal mucosa trimmed away and closed with 2-0 Vicryl in a running lock suture.  At the conclusion of the procedure, there was no active bleeding noted.  There was some slight oozing at the vaginal cuff which was not active in nature.  Therefore, the vagina was packed with Estrace gauze, and a rectal exam was performed and found to be normal.  The instruments were then removed from the patients vagina and attention once again returned to the abdomen where the camera was again placed in the umbilical trocar.  The vaginal cuff was  then well-irrigated and inspected intra-abdominally with one small area of oozing noted along the left vaginal cuff.  This was easily controlled with bipolar cautery with no additional bleeding noted.  Therefore, all instruments and sponges were removed from the abdomen.  The trocars were removed under direct visualization with no active bleeding noted and the pneumoperitoneum reduced prior to removing the umbilical trocar.  The incisions were then closed with 0 Vicryl and a deep suture at the umbilicus and in 3-0 Vicryl in a subcuticular stitch at all three sites.  Sponge, lap, and needle counts again were correct x 2, and the patient was taken to the recovery room in stable condition. Dictated by:   Huel Cote, M.D. Attending Physician:  Oliver Pila DD:  07/02/01 TD:  07/02/01 Job: [id number]

## 2010-10-15 ENCOUNTER — Encounter (INDEPENDENT_AMBULATORY_CARE_PROVIDER_SITE_OTHER): Payer: Self-pay | Admitting: Surgery

## 2010-10-19 ENCOUNTER — Encounter (HOSPITAL_COMMUNITY)
Admission: RE | Admit: 2010-10-19 | Discharge: 2010-10-19 | Disposition: A | Discharge status: Home / Self Care | Payer: BC Managed Care – PPO | Source: Ambulatory Visit | Attending: Surgery | Admitting: Surgery

## 2010-10-19 LAB — SURGICAL PCR SCREEN
MRSA, PCR: NEGATIVE
Staphylococcus aureus: POSITIVE — AB

## 2010-10-19 LAB — BASIC METABOLIC PANEL
CO2: 28 mEq/L (ref 19–32)
GFR calc non Af Amer: >60 mL/min (ref >60)
Glucose, Bld: 88 mg/dL (ref 70–99)
Potassium: 4 mEq/L (ref 3.5–5.1)
Sodium: 138 mEq/L (ref 135–145)

## 2010-10-19 LAB — CBC
Hemoglobin: 12.7 g/dL (ref 12.0–15.0)
RBC: 4.44 MIL/uL (ref 3.87–5.11)

## 2010-10-21 ENCOUNTER — Other Ambulatory Visit (INDEPENDENT_AMBULATORY_CARE_PROVIDER_SITE_OTHER): Payer: Self-pay | Admitting: Surgery

## 2010-10-21 ENCOUNTER — Ambulatory Visit (HOSPITAL_COMMUNITY)
Admission: RE | Admit: 2010-10-21 | Discharge: 2010-10-21 | Disposition: A | Discharge status: Home / Self Care | Payer: BC Managed Care – PPO | Source: Ambulatory Visit | Attending: Surgery | Admitting: Surgery

## 2010-10-21 DIAGNOSIS — K644 Residual hemorrhoidal skin tags: Secondary | ICD-10-CM | POA: Insufficient documentation

## 2010-10-21 DIAGNOSIS — Z8673 Personal history of transient ischemic attack (TIA), and cerebral infarction without residual deficits: Secondary | ICD-10-CM | POA: Insufficient documentation

## 2010-10-21 DIAGNOSIS — I10 Essential (primary) hypertension: Secondary | ICD-10-CM | POA: Insufficient documentation

## 2010-10-21 DIAGNOSIS — Z01812 Encounter for preprocedural laboratory examination: Secondary | ICD-10-CM | POA: Insufficient documentation

## 2010-10-21 HISTORY — PX: OTHER SURGICAL HISTORY: SHX169

## 2010-10-21 HISTORY — PX: ANAL EXAMINATION UNDER ANESTHESIA: SHX1138

## 2010-10-26 NOTE — Op Note (Signed)
  NAME:  Tammy Cooper, Tammy Cooper              ACCOUNT NO.:  [id number]  MEDICAL RECORD NO.:  [id number]  LOCATION:  [facility]                         FACILITY:  [facility]  PHYSICIAN:  Abigail Miyamoto, M.D. DATE OF BIRTH:  12/12/1950  DATE OF PROCEDURE:  10/21/2010 DATE OF DISCHARGE:                              OPERATIVE REPORT   PREOPERATIVE DIAGNOSIS:  Anal skin tag.  POSTOPERATIVE DIAGNOSIS:  Anal skin tag.  PROCEDURE:  Examination under anesthesia with excision of anal skin tag.  SURGEON:  Abigail Miyamoto, MD  ANESTHESIA:  General and injectable bupivacaine.  ESTIMATED BLOOD LOSS:  Minimal.  INDICATIONS:  [name] is a 60 year old female who has a very large skin tag in her anterior anal midline.  This has been causing her to have extensive hygiene problems and anal leaking with excoriation of the perianal skin.  Decision has been made to proceed with excision of the skin tag.  PROCEDURE IN DETAIL:  The patient was brought to the operating room, identified as [name].  She was placed supine on the operating room table and general anesthesia was induced.  She was then placed in the lithotomy position.  Her perianal area was then prepped and draped in usual sterile fashion.  I took some circumferential inspection of the anus and anal canal.  The patient had a large skin tag in the anterior midline as well as 2 smaller ones in the poster midline.  At this point, I excised the large skin tag after grasping with an Allis clamp.  I then injected the wound with Exparel and closed it with interrupted 3-0 chromic sutures.  I then evaluated 2 smaller skin tags, decided to go ahead and remove these.  I removed these with electrocautery and closed the defect as well with interrupted chromic sutures after anesthetizing it with the Exparel as well.  The large skin tag was sent to pathology for evaluation.  The patient was then extubated in the operating and was taken in stable condition  to recovery room.     Abigail Miyamoto, M.D.     DB/MEDQ  D:  10/21/2010  T:  10/21/2010  Job:  [id number]  Electronically Signed by Abigail Miyamoto M.D. on 10/26/2010 03:36:04 PM

## 2010-11-02 ENCOUNTER — Ambulatory Visit (INDEPENDENT_AMBULATORY_CARE_PROVIDER_SITE_OTHER): Payer: BC Managed Care – PPO | Admitting: Surgery

## 2010-11-02 DIAGNOSIS — Z9889 Other specified postprocedural states: Secondary | ICD-10-CM

## 2010-11-02 NOTE — Progress Notes (Signed)
Subjective:     Patient ID: Tammy Cooper, female   DOB: 04/23/1951, 60 y.o.   MRN: [id number]    There were no vitals taken for this visit.    HPI She is here for her first postoperative visit status post excision of large anal skin tags. She is doing moderately well. She is moving her bowels well. She is still having some drainage. Review of Systems     Objective:   Physical Exam    On exam, she has open areas at the anterior and posterior midline which are clean with some mild swelling. Assessment:     Patient status post excision of anal skin tags.    Plan:         She will continue her local wound care. If the swelling persists, I will see her back. If not, I will see her as needed.

## 2010-11-30 ENCOUNTER — Other Ambulatory Visit: Payer: Self-pay | Admitting: Family Medicine

## 2010-11-30 DIAGNOSIS — N6452 Nipple discharge: Secondary | ICD-10-CM

## 2010-12-06 ENCOUNTER — Ambulatory Visit
Admission: RE | Admit: 2010-12-06 | Discharge: 2010-12-06 | Disposition: A | Discharge status: Home / Self Care | Payer: BC Managed Care – PPO | Source: Ambulatory Visit | Attending: Family Medicine | Admitting: Family Medicine

## 2010-12-06 DIAGNOSIS — N6452 Nipple discharge: Secondary | ICD-10-CM

## 2011-08-23 ENCOUNTER — Encounter (INDEPENDENT_AMBULATORY_CARE_PROVIDER_SITE_OTHER): Payer: Self-pay | Admitting: Surgery

## 2011-08-23 ENCOUNTER — Ambulatory Visit (INDEPENDENT_AMBULATORY_CARE_PROVIDER_SITE_OTHER): Payer: BC Managed Care – PPO | Admitting: Surgery

## 2011-08-23 VITALS — BP 140/74 | HR 78 | Resp 16 | Ht 63.0 in | Wt 190.0 lb

## 2011-08-23 DIAGNOSIS — R21 Rash and other nonspecific skin eruption: Secondary | ICD-10-CM

## 2011-08-23 NOTE — Progress Notes (Signed)
Subjective:     Patient ID: Tammy Cooper, female   DOB: Aug 09, 1950, 61 y.o.   MRN: [id number]  HPI She is here for a followup visit. I excised a large perianal skin tag on her last year in the operating room. She was unable to followup secondary to family issues.  Since then, she has had a raw area bleeds on her perianal area. She has been on an antifungal cream but has not had any relief.  Review of Systems     Objective:   Physical Exam On exam, there is a rash appearing lesion on her left perianal area. There is a small area of granulation tissue with the skin tag was removed which I treated with silver nitrate.    Assessment:     Perianal rash    Plan:     I told her to try cortisone cream on the rash that is closest to the buttocks and not on the perianal area itself. I told her to stop the antifungal cream. I will see her back in 3 weeks

## 2011-09-13 ENCOUNTER — Encounter (INDEPENDENT_AMBULATORY_CARE_PROVIDER_SITE_OTHER): Payer: BC Managed Care – PPO | Admitting: Surgery

## 2011-09-29 ENCOUNTER — Encounter (INDEPENDENT_AMBULATORY_CARE_PROVIDER_SITE_OTHER): Payer: BC Managed Care – PPO | Admitting: Surgery

## 2011-10-20 ENCOUNTER — Ambulatory Visit (INDEPENDENT_AMBULATORY_CARE_PROVIDER_SITE_OTHER): Payer: BC Managed Care – PPO | Admitting: Surgery

## 2011-10-20 ENCOUNTER — Encounter (INDEPENDENT_AMBULATORY_CARE_PROVIDER_SITE_OTHER): Payer: Self-pay | Admitting: Surgery

## 2011-10-20 VITALS — BP 154/68 | HR 76 | Temp 98.2°F | Resp 16 | Ht 63.0 in | Wt 192.4 lb

## 2011-10-20 DIAGNOSIS — R21 Rash and other nonspecific skin eruption: Secondary | ICD-10-CM

## 2011-10-20 NOTE — Progress Notes (Signed)
Subjective:     Patient ID: Tammy Cooper, female   DOB: 1951/01/10, 61 y.o.   MRN: [id number]  HPI She reports that she is improved with the cortisone cream. She still has some perianal itching but it is much less.  Review of Systems     Objective:   Physical Exam On exam, the irritated perianal rash has improved significantly.    Assessment:     Perianal rash    Plan:     She will continue to cortisone. I will see her back as needed

## 2012-02-15 ENCOUNTER — Other Ambulatory Visit: Payer: Self-pay | Admitting: Family Medicine

## 2012-02-15 DIAGNOSIS — Z1231 Encounter for screening mammogram for malignant neoplasm of breast: Secondary | ICD-10-CM

## 2012-03-19 ENCOUNTER — Ambulatory Visit
Admission: RE | Admit: 2012-03-19 | Discharge: 2012-03-19 | Disposition: A | Discharge status: Home / Self Care | Payer: BC Managed Care – PPO | Source: Ambulatory Visit | Attending: Family Medicine | Admitting: Family Medicine

## 2012-03-19 DIAGNOSIS — Z1231 Encounter for screening mammogram for malignant neoplasm of breast: Secondary | ICD-10-CM

## 2013-02-11 ENCOUNTER — Other Ambulatory Visit: Payer: Self-pay | Admitting: *Deleted

## 2013-02-11 DIAGNOSIS — Z79899 Other long term (current) drug therapy: Secondary | ICD-10-CM

## 2013-03-11 ENCOUNTER — Other Ambulatory Visit (INDEPENDENT_AMBULATORY_CARE_PROVIDER_SITE_OTHER): Payer: BC Managed Care – PPO

## 2013-03-11 DIAGNOSIS — Z79899 Other long term (current) drug therapy: Secondary | ICD-10-CM

## 2013-03-11 LAB — ALT: ALT: 23 U/L (ref 0–35)

## 2013-03-15 ENCOUNTER — Ambulatory Visit: Payer: BC Managed Care – PPO | Admitting: *Deleted

## 2013-03-15 DIAGNOSIS — E785 Hyperlipidemia, unspecified: Secondary | ICD-10-CM

## 2013-03-15 LAB — NMR LIPOPROFILE WITH LIPIDS
HDL Size: 9.4 nm (ref >=9.2)
HDL-C: 50 mg/dL (ref >=40)
LDL (calc): 79 mg/dL (ref <100)
LDL Size: 20.1 nm — ABNORMAL LOW (ref >20.5)
Large HDL-P: 6.8 umol/L (ref >=4.8)
Small LDL Particle Number: 643 nmol/L — ABNORMAL HIGH (ref <=527)

## 2013-03-18 ENCOUNTER — Ambulatory Visit (INDEPENDENT_AMBULATORY_CARE_PROVIDER_SITE_OTHER): Payer: BC Managed Care – PPO | Admitting: Pharmacist

## 2013-03-18 VITALS — BP 128/80 | Wt 195.6 lb

## 2013-03-18 DIAGNOSIS — Z79899 Other long term (current) drug therapy: Secondary | ICD-10-CM

## 2013-03-18 DIAGNOSIS — E785 Hyperlipidemia, unspecified: Secondary | ICD-10-CM

## 2013-03-18 NOTE — Patient Instructions (Signed)
Plan:  1.  Continue Crestor 5 mg and Zetia 10 mg daily. 2.  Continue to work towards 10,000 steps daily on FitBit. 3.  Continue dietary plan with Vemma and MyFitnessPal 4.  Goal weight loss - to 180 lbs. 5.  Recheck labwork 6 months, and see Riki Rusk 3 days later.

## 2013-03-18 NOTE — Assessment & Plan Note (Addendum)
Excellent NMR LipoProfile since adding Zetia 10 mg to Crestor daily.  She wasn't able to tolerate Crestor 10 mg, so this was reduced to Crestor 5 mg qd, however LDL-P still < 1000 nmol/L on Crestor 5 mg + Zetia 10 mg qd.  Patient's diet and exercise regimen has been easy for patient to follow with no complaints.  She has lost 3 lbs so far, and would like to lose another 15 lbs, as her initial goal was 10% weight loss (or 180 lbs).  Patient will continue same medication regimen, diet, and exercise regimen, and hope to increase walking to 10,000 steps per day if she can control her arthritis pain with Tylenol.  She will call her PCP if tylenol doesn't improve her arthritis pain.    Plan:  1.  Continue Crestor 5 mg and Zetia 10 mg daily. 2.  Continue to work towards 10,000 steps daily on FitBit. 3.  Continue dietary plan with Vemma and MyFitnessPal 4.  Goal weight loss - to 180 lbs. 5.  Recheck labwork 6 months (5/18), and see Riki Rusk 3 days later (5/21 at 10:30)

## 2013-03-18 NOTE — Progress Notes (Signed)
Patient here for followup due to h/o elevated LDL, TG, and non-HDL and trouble tolerating statins in the past. She is tolerating Crestor 5 mg qd + Zetia 10 mg qd well at this time.  She was on Crestor 10 mg and Zetia daily up until late 12/2012 when due to muscle and joint aches she reduced to Crestor 5 mg qd + Zetia 10 mg qd.  She has tolerated that regimen since then.    Cardiac risk factors possible TIA 2006, age, HTN.  Patient was on HRT for 10 years, and thinks she was on HRT (estrogen) at time of TIA  Target LDL < 100 preferred given possible TIA (LDL-P goal < 1000 if possible).  Target non-HDL < 130 preferred.  Medications currently taking: Crestor 5 mg qd, Zetia 10 mg qd, Co-Q 10 200 mg qd.   Intolerance Lipitor and Crestor (10 mg) caused muscles aches in the past. Myalgias were mostly in the legs. Patient took Welchol in the past, however LDL didn't improve on this.   Diet:  Patient is now using MyFitnessPal App and also using Vemma Diet - typically limiting to ~ 1250 calories per day.  She is using her FitBit as well to help track calories, and log food diary.  She has lost 3 lbs over past 6 months.  Still belongs to Weight Watchers but not using them. Exercise:  Walking 4-5 day per week at a local park for ~ 35 minutes per day.  She is typically getting ~ 8,000 steps per day.  Her goal is to get up to 10,000 steps per day, but he knees start getting sore after 30 minutes of walking.  Patient has been taking Aleve for arthritis for past few weeks.  Blood pressure still normal.  She will change to tylenol for arthritis pain instead, and if no benefit from this, she will call her PCP (Dr. Merri Brunette) to discuss treatment options.  She also is going to a message therapist every 2 weeks which has helped with arthritis pain.  Labs:   02/2013  LDL-P 926 (goal < 1000), LDL 79 (goal < 100 at least), TC 153, HDL 50, TG 113 (Crestor 5 mg qd + Zetia 10 mg qd) 07/2012   LDL-P 1608, LDL 88, TC 178, HDL  53, TG 186 (Crestor 10 mg qd)  Current Outpatient Prescriptions  Medication Sig Dispense Refill  . cholecalciferol (VITAMIN D) 1000 UNITS tablet Take 1,000 Units by mouth daily.      [name] Co-Enzyme Q10 200 MG CAPS Take 200 mg by mouth daily.      [name] ezetimibe (ZETIA) 10 MG tablet Take 10 mg by mouth daily.      . meloxicam (MOBIC) 15 MG tablet Take 15 mg by mouth daily.      . Multiple Vitamins-Minerals (MULTIVITAMIN PO) Take by mouth 2 (two) times daily. Vemma vitamin      . rosuvastatin (CRESTOR) 10 MG tablet Take 5 mg by mouth daily.      [name] aspirin 81 MG tablet Take 81 mg by mouth daily.        . calcium-vitamin D (OSCAL) 250-125 MG-UNIT per tablet Take 1 tablet by mouth daily.        . Omega-3 Fatty Acids (FISH OIL PO) Take 1,000 mg by mouth daily.       [name] telmisartan-hydrochlorothiazide (MICARDIS HCT) 40-12.5 MG per tablet Take 1 tablet by mouth daily.         No current facility-administered medications for  this visit.   Allergies  Allergen Reactions  . Lipitor [Atorvastatin]     Muscle aches   Family History  Problem Relation Age of Onset  . Cancer Father     lung

## 2013-03-25 ENCOUNTER — Other Ambulatory Visit: Payer: Self-pay

## 2013-03-25 DIAGNOSIS — Z1231 Encounter for screening mammogram for malignant neoplasm of breast: Secondary | ICD-10-CM

## 2013-04-08 ENCOUNTER — Other Ambulatory Visit: Payer: Self-pay | Admitting: Cardiology

## 2013-05-01 ENCOUNTER — Ambulatory Visit
Admission: RE | Admit: 2013-05-01 | Discharge: 2013-05-01 | Disposition: A | Discharge status: Home / Self Care | Payer: 59 | Source: Ambulatory Visit

## 2013-05-01 DIAGNOSIS — Z1231 Encounter for screening mammogram for malignant neoplasm of breast: Secondary | ICD-10-CM

## 2013-05-13 ENCOUNTER — Other Ambulatory Visit: Payer: Self-pay | Admitting: Cardiology

## 2013-09-09 ENCOUNTER — Other Ambulatory Visit (INDEPENDENT_AMBULATORY_CARE_PROVIDER_SITE_OTHER): Payer: 59

## 2013-09-09 ENCOUNTER — Encounter: Payer: Self-pay | Admitting: General Surgery

## 2013-09-09 DIAGNOSIS — Z79899 Other long term (current) drug therapy: Secondary | ICD-10-CM

## 2013-09-09 DIAGNOSIS — E785 Hyperlipidemia, unspecified: Secondary | ICD-10-CM

## 2013-09-09 LAB — COMPREHENSIVE METABOLIC PANEL
ALT: 25 U/L (ref 0–35)
AST: 25 U/L (ref 0–37)
Albumin: 4 g/dL (ref 3.5–5.2)
Alkaline Phosphatase: 83 U/L (ref 39–117)
BILIRUBIN TOTAL: 0.6 mg/dL (ref 0.2–1.2)
BUN: 18 mg/dL (ref 6–23)
CO2: 28 mEq/L (ref 19–32)
CREATININE: 0.8 mg/dL (ref 0.4–1.2)
Calcium: 9.2 mg/dL (ref 8.4–10.5)
Chloride: 106 mEq/L (ref 96–112)
GFR: 79.88 mL/min (ref >60.00)
Glucose, Bld: 99 mg/dL (ref 70–99)
Potassium: 3.8 mEq/L (ref 3.5–5.1)
Sodium: 140 mEq/L (ref 135–145)
Total Protein: 6.6 g/dL (ref 6.0–8.3)

## 2013-09-10 LAB — NMR LIPOPROFILE WITH LIPIDS
Cholesterol, Total: 136 mg/dL (ref <200)
HDL Particle Number: 40.6 umol/L (ref >=30.5)
HDL Size: 9.1 nm — ABNORMAL LOW (ref >=9.2)
HDL-C: 55 mg/dL (ref >=40)
LDL CALC: 59 mg/dL (ref <100)
LDL Particle Number: 861 nmol/L (ref <1000)
LDL SIZE: 20.1 nm — AB (ref >20.5)
LP-IR SCORE: 57 — AB (ref <=45)
Large HDL-P: 2.8 umol/L — ABNORMAL LOW (ref >=4.8)
Large VLDL-P: 2.9 nmol/L — ABNORMAL HIGH (ref <=2.7)
SMALL LDL PARTICLE NUMBER: 474 nmol/L (ref <=527)
Triglycerides: 111 mg/dL (ref <150)
VLDL Size: 50 nm — ABNORMAL HIGH (ref <=46.6)

## 2013-09-12 ENCOUNTER — Ambulatory Visit (INDEPENDENT_AMBULATORY_CARE_PROVIDER_SITE_OTHER): Payer: 59 | Admitting: Pharmacist

## 2013-09-12 VITALS — Wt 196.0 lb

## 2013-09-12 DIAGNOSIS — E785 Hyperlipidemia, unspecified: Secondary | ICD-10-CM

## 2013-09-12 DIAGNOSIS — Z79899 Other long term (current) drug therapy: Secondary | ICD-10-CM

## 2013-09-12 MED ORDER — TELMISARTAN-HCTZ 40-12.5 MG PO TABS: 1.0000 | ORAL_TABLET | Freq: Every day | ORAL | Status: DC

## 2013-09-12 MED ORDER — ROSUVASTATIN CALCIUM 10 MG PO TABS: ORAL_TABLET | ORAL | Status: DC

## 2013-09-12 MED ORDER — EZETIMIBE 10 MG PO TABS: 10.0000 mg | ORAL_TABLET | Freq: Every day | ORAL | Status: DC

## 2013-09-12 NOTE — Patient Instructions (Signed)
1.  Continue Zetia 10 mg once daily and Crestor 5 mg once daily. 2.  Try Glucosamine with Chondroitin daily for arthritis.  Want 1500 mg daily of glucosamine ingredient.  If no relief in 4-6 weeks, would stop taking it. 3.  Recheck NMR LipoProfile and liver function enzymes in 1 year (09/02/14 fasting lab- show up anytime after 7:30 am), see Ysidro Evert 2 days later on 09/04/14 at 10:30 am

## 2013-09-12 NOTE — Assessment & Plan Note (Signed)
Patient to continue current regimen.  She is due to see Dr. Radford Pax in a few months for her annual visit.  Given excellent lipid panel, she will recheck this in 1 year, unless she has problems otherwise, in which case she agrees to call me. Plan: 1.  Continue Zetia 10 mg once daily and Crestor 5 mg once daily. 2.  Try Glucosamine with Chondroitin daily for arthritis.  Want 1500 mg daily of glucosamine ingredient.  If no relief in 4-6 weeks, would stop taking it. 3.  Recheck NMR LipoProfile and liver function enzymes in 1 year (09/02/14 fasting lab- show up anytime after 7:30 am), see Ysidro Evert 2 days later on 09/04/14 at 10:30 am

## 2013-09-12 NOTE — Progress Notes (Signed)
Patient here for followup due to h/o elevated LDL, TG, and non-HDL and trouble tolerating statins in the past. She is tolerating Crestor 5 mg qd + Zetia 10 mg qd well at this time.  She was on Crestor 10 mg and Zetia daily up until late 12/2012 when due to muscle and joint aches she reduced to Crestor 5 mg qd + Zetia 10 mg qd.  She has tolerated that regimen since then.  She has been unable to lose weight using calorie counting and FitBit, so she started doing Weight Watchers again this week, 26 points is her target.  Her son is getting married in 4 months and this is her extra motivation to lose weight.  Cardiac risk factors possible TIA 2006, age, HTN.  Patient was on HRT for 10 years, and thinks she was on HRT (estrogen) at time of TIA  Target LDL < 100 preferred given possible TIA (LDL-P goal < 1000 if possible).  Target non-HDL < 130 preferred.  Medications currently taking: Crestor 5 mg qd, Zetia 10 mg qd, Co-Q 10 200 mg qd.   Intolerance Lipitor and Crestor (10 mg) caused muscles aches in the past. Myalgias were mostly in the legs. Patient took Welchol in the past, however LDL didn't improve on this.   Diet:  Patient was using MyFitnessPal App until this week, and just changed to Weight Watchers, which was successful for her in the past.   Also using Vemma vitamin supplement drink.  She is using her FitBit as well to help track calories, and log food diary.  Would like to lose 10-20 lbs over next few months. Exercise:  Walking 4-5 day per week at a local park for ~ 35 minutes per day.  She is typically getting ~ 8,000 - 10,000 steps per day.  Arthritis in her knees does limit her walking some days.  Patient no longer daily NSAIDs for her arthritis.  She asks about glucosamine with chondroitin today.  Labs: 08/2013:  LDL-P 861, LDL 59, TC 136, TG 111, HDL 55, LFTs normal, CMET normal - (Crestor 5 mg qd and Zetia 10 mg qd) 02/2013  LDL-P 926 (goal < 1000), LDL 79 (goal < 100 at least), TC 153, HDL  50, TG 113 (Crestor 5 mg qd + Zetia 10 mg qd) 07/2012   LDL-P 1608, LDL 88, TC 178, HDL 53, TG 186 (Crestor 10 mg qd)  Current Outpatient Prescriptions  Medication Sig Dispense Refill  . aspirin 81 MG tablet Take 81 mg by mouth daily.        . calcium-vitamin D (OSCAL) 250-125 MG-UNIT per tablet Take 1 tablet by mouth daily.        . cholecalciferol (VITAMIN D) 1000 UNITS tablet Take 1,000 Units by mouth daily.      [name] Co-Enzyme Q10 200 MG CAPS Take 200 mg by mouth daily.      [name] ezetimibe (ZETIA) 10 MG tablet Take 1 tablet (10 mg total) by mouth daily.  90 tablet  3  . Multiple Vitamins-Minerals (MULTIVITAMIN PO) Take by mouth 2 (two) times daily. Vemma vitamin      . Omega-3 Fatty Acids (FISH OIL PO) Take 1,000 mg by mouth daily.       . rosuvastatin (CRESTOR) 10 MG tablet 1/2 tablet by mouth daily  45 tablet  3  . telmisartan-hydrochlorothiazide (MICARDIS HCT) 40-12.5 MG per tablet Take 1 tablet by mouth daily.  90 tablet  3   No current facility-administered medications for this visit.  Allergies  Allergen Reactions  . Lipitor [Atorvastatin]     Muscle aches   Family History  Problem Relation Age of Onset  . Cancer Father     lung

## 2013-10-18 ENCOUNTER — Encounter: Payer: Self-pay | Admitting: General Surgery

## 2013-11-12 ENCOUNTER — Ambulatory Visit (INDEPENDENT_AMBULATORY_CARE_PROVIDER_SITE_OTHER): Payer: 59 | Admitting: Cardiology

## 2013-11-12 VITALS — BP 115/82 | HR 62 | Ht 63.0 in | Wt 193.0 lb

## 2013-11-12 DIAGNOSIS — I10 Essential (primary) hypertension: Secondary | ICD-10-CM

## 2013-11-12 DIAGNOSIS — E669 Obesity, unspecified: Secondary | ICD-10-CM

## 2013-11-12 DIAGNOSIS — E785 Hyperlipidemia, unspecified: Secondary | ICD-10-CM

## 2013-11-12 NOTE — Progress Notes (Signed)
  [address] [place], [place]  [address] Phone: [phone] Fax:  [phone]  Date:  11/12/2013   ID:  Pier, Laux Jul 02, 1950, MRN [id number]  PCP:  Reginia Naas, MD  Cardiologist:  Fransico Him, MD     History of Present Illness: Tammy Cooper is a 63 y.o. female with a history of HTN and dyslipidemia who presents today for followup.  She is doing well.  She denies any chest pain, SOB, DOE, LE edema, dizziness, palpitations or syncope.   Wt Readings from Last 3 Encounters:  11/12/13 193 lb (87.544 kg)  09/12/13 196 lb (88.905 kg)  03/18/13 195 lb 9.6 oz (88.724 kg)     Past Medical History  Diagnosis Date  . Hypertension   . Hypercholesteremia   . Stroke     H/O TIA in 2006, MRA clear,arteriogram was done which was normal  . Stenosing tenosynovitis     Of multiple digits- Dr Grandville Silos    Current Outpatient Prescriptions  Medication Sig Dispense Refill  . aspirin 81 MG tablet Take 81 mg by mouth daily.        . calcium-vitamin D (OSCAL) 250-125 MG-UNIT per tablet Take 1 tablet by mouth daily.        . cholecalciferol (VITAMIN D) 1000 UNITS tablet Take 1,000 Units by mouth daily.      [name] Co-Enzyme Q10 200 MG CAPS Take 200 mg by mouth daily.      [name] ezetimibe (ZETIA) 10 MG tablet Take 1 tablet (10 mg total) by mouth daily.  90 tablet  3  . Multiple Vitamins-Minerals (MULTIVITAMIN PO) Take by mouth 2 (two) times daily. Vemma vitamin      . Omega-3 Fatty Acids (FISH OIL PO) Take 1,000 mg by mouth daily.       . rosuvastatin (CRESTOR) 10 MG tablet 1/2 tablet by mouth daily  45 tablet  3  . telmisartan-hydrochlorothiazide (MICARDIS HCT) 40-12.5 MG per tablet Take 1 tablet by mouth daily.  90 tablet  3   No current facility-administered medications for this visit.    Allergies:    Allergies  Allergen Reactions  . Lipitor [Atorvastatin]     Muscle aches    Social History:  The patient  reports that she has never smoked. She has never used  smokeless tobacco. She reports that she drinks alcohol. She reports that she does not use illicit drugs.   Family History:  The patient's family history includes Cancer in her father.   ROS:  Please see the history of present illness.      All other systems reviewed and negative.   PHYSICAL EXAM: VS:  BP 115/82  Pulse 62  Ht 5\' 3"  (1.6 m)  Wt 193 lb (87.544 kg)  BMI 34.20 kg/m2 Well nourished, well developed, in no acute distress HEENT: normal Neck: no JVD Cardiac:  normal S1, S2; RRR; no murmur Lungs:  clear to auscultation bilaterally, no wheezing, rhonchi or rales Abd: soft, nontender, no hepatomegaly Ext: no edema Skin: warm and dry Neuro:  CNs 2-12 intact, no focal abnormalities noted    ASSESSMENT AND PLAN:  1. Dyslipidemia - lipids at goal - continue Zetia/Crestor 2. HTN - controlled - continue Micardis 3.  Obesity - her exercise is limited right now due to knee pain but she is doing weight watchers  Followup with me in 1 year  Signed, Fransico Him, MD 11/12/2013 9:57 AM

## 2013-11-12 NOTE — Patient Instructions (Signed)
Your physician recommends that you continue on your current medications as directed. Please refer to the Current Medication list given to you today.  Your physician wants you to follow-up in: 1 year with Dr. Turner. You will receive a reminder letter in the mail two months in advance. If you don't receive a letter, please call our office to schedule the follow-up appointment.  

## 2013-11-23 ENCOUNTER — Other Ambulatory Visit: Payer: Self-pay | Admitting: Cardiology

## 2014-04-22 ENCOUNTER — Other Ambulatory Visit: Payer: Self-pay

## 2014-04-22 DIAGNOSIS — N6452 Nipple discharge: Secondary | ICD-10-CM

## 2014-04-24 ENCOUNTER — Other Ambulatory Visit: Payer: Self-pay | Admitting: Family Medicine

## 2014-04-24 DIAGNOSIS — N6452 Nipple discharge: Secondary | ICD-10-CM

## 2014-04-25 DIAGNOSIS — Z853 Personal history of malignant neoplasm of breast: Secondary | ICD-10-CM

## 2014-04-25 HISTORY — DX: Personal history of malignant neoplasm of breast: Z85.3

## 2014-05-05 ENCOUNTER — Other Ambulatory Visit: Payer: 59

## 2014-05-07 ENCOUNTER — Ambulatory Visit
Admission: RE | Admit: 2014-05-07 | Discharge: 2014-05-07 | Disposition: A | Discharge status: Home / Self Care | Payer: Medicare Other | Source: Ambulatory Visit | Attending: Family Medicine | Admitting: Family Medicine

## 2014-05-07 DIAGNOSIS — N6452 Nipple discharge: Secondary | ICD-10-CM

## 2014-05-09 ENCOUNTER — Other Ambulatory Visit: Payer: Self-pay | Admitting: Family Medicine

## 2014-05-09 DIAGNOSIS — N6452 Nipple discharge: Secondary | ICD-10-CM

## 2014-05-22 ENCOUNTER — Ambulatory Visit
Admission: RE | Admit: 2014-05-22 | Discharge: 2014-05-22 | Disposition: A | Discharge status: Home / Self Care | Payer: PRIVATE HEALTH INSURANCE | Source: Ambulatory Visit | Attending: Family Medicine | Admitting: Family Medicine

## 2014-05-22 DIAGNOSIS — N6452 Nipple discharge: Secondary | ICD-10-CM

## 2014-05-22 MED ORDER — GADOBENATE DIMEGLUMINE 529 MG/ML IV SOLN
19.0000 mL | Freq: Once | INTRAVENOUS | Status: AC | PRN
  Administered 2014-05-22: 19 mL via INTRAVENOUS

## 2014-05-23 ENCOUNTER — Other Ambulatory Visit: Payer: Self-pay | Admitting: Family Medicine

## 2014-05-23 DIAGNOSIS — R928 Other abnormal and inconclusive findings on diagnostic imaging of breast: Secondary | ICD-10-CM

## 2014-05-28 ENCOUNTER — Ambulatory Visit
Admission: RE | Admit: 2014-05-28 | Discharge: 2014-05-28 | Disposition: A | Discharge status: Home / Self Care | Payer: PRIVATE HEALTH INSURANCE | Source: Ambulatory Visit | Attending: Family Medicine | Admitting: Family Medicine

## 2014-05-28 DIAGNOSIS — R928 Other abnormal and inconclusive findings on diagnostic imaging of breast: Secondary | ICD-10-CM

## 2014-05-28 DIAGNOSIS — C50919 Malignant neoplasm of unspecified site of unspecified female breast: Secondary | ICD-10-CM

## 2014-05-28 HISTORY — PX: BREAST BIOPSY: SHX20

## 2014-05-28 HISTORY — DX: Malignant neoplasm of unspecified site of unspecified female breast: C50.919

## 2014-05-28 MED ORDER — GADOBENATE DIMEGLUMINE 529 MG/ML IV SOLN: 19.0000 mL | Freq: Once | INTRAVENOUS | Status: AC | PRN

## 2014-05-29 ENCOUNTER — Other Ambulatory Visit: Payer: Self-pay | Admitting: Family Medicine

## 2014-05-29 DIAGNOSIS — R921 Mammographic calcification found on diagnostic imaging of breast: Secondary | ICD-10-CM

## 2014-06-13 ENCOUNTER — Ambulatory Visit
Admission: RE | Admit: 2014-06-13 | Discharge: 2014-06-13 | Disposition: A | Discharge status: Home / Self Care | Payer: Medicare Other | Source: Ambulatory Visit | Attending: Family Medicine | Admitting: Family Medicine

## 2014-06-13 ENCOUNTER — Other Ambulatory Visit: Payer: Self-pay | Admitting: Family Medicine

## 2014-06-13 DIAGNOSIS — R921 Mammographic calcification found on diagnostic imaging of breast: Secondary | ICD-10-CM

## 2014-06-13 HISTORY — PX: BREAST BIOPSY: SHX20

## 2014-06-27 ENCOUNTER — Other Ambulatory Visit (INDEPENDENT_AMBULATORY_CARE_PROVIDER_SITE_OTHER): Payer: Self-pay | Admitting: Surgery

## 2014-06-27 DIAGNOSIS — R897 Abnormal histological findings in specimens from other organs, systems and tissues: Secondary | ICD-10-CM

## 2014-07-15 ENCOUNTER — Other Ambulatory Visit (INDEPENDENT_AMBULATORY_CARE_PROVIDER_SITE_OTHER): Payer: Self-pay | Admitting: Surgery

## 2014-07-15 DIAGNOSIS — R897 Abnormal histological findings in specimens from other organs, systems and tissues: Secondary | ICD-10-CM

## 2014-07-25 ENCOUNTER — Encounter (HOSPITAL_BASED_OUTPATIENT_CLINIC_OR_DEPARTMENT_OTHER): Payer: Self-pay | Admitting: *Deleted

## 2014-07-25 NOTE — Progress Notes (Signed)
To come in for ekg-bmet-is nervous-got xanax, can take as needed

## 2014-07-29 ENCOUNTER — Encounter (HOSPITAL_BASED_OUTPATIENT_CLINIC_OR_DEPARTMENT_OTHER)
Admission: RE | Admit: 2014-07-29 | Discharge: 2014-07-29 | Disposition: A | Discharge status: Home / Self Care | Payer: Medicare Other | Source: Ambulatory Visit | Attending: Surgery | Admitting: Surgery

## 2014-07-29 DIAGNOSIS — R921 Mammographic calcification found on diagnostic imaging of breast: Secondary | ICD-10-CM | POA: Diagnosis not present

## 2014-07-29 DIAGNOSIS — R897 Abnormal histological findings in specimens from other organs, systems and tissues: Secondary | ICD-10-CM | POA: Diagnosis present

## 2014-07-29 DIAGNOSIS — Z8673 Personal history of transient ischemic attack (TIA), and cerebral infarction without residual deficits: Secondary | ICD-10-CM | POA: Diagnosis not present

## 2014-07-29 DIAGNOSIS — R9431 Abnormal electrocardiogram [ECG] [EKG]: Secondary | ICD-10-CM | POA: Diagnosis not present

## 2014-07-29 DIAGNOSIS — I252 Old myocardial infarction: Secondary | ICD-10-CM | POA: Diagnosis not present

## 2014-07-29 DIAGNOSIS — I509 Heart failure, unspecified: Secondary | ICD-10-CM | POA: Diagnosis not present

## 2014-07-29 DIAGNOSIS — D0512 Intraductal carcinoma in situ of left breast: Secondary | ICD-10-CM | POA: Diagnosis not present

## 2014-07-29 DIAGNOSIS — I10 Essential (primary) hypertension: Secondary | ICD-10-CM | POA: Diagnosis not present

## 2014-07-29 DIAGNOSIS — M1990 Unspecified osteoarthritis, unspecified site: Secondary | ICD-10-CM | POA: Diagnosis not present

## 2014-07-29 DIAGNOSIS — E780 Pure hypercholesterolemia: Secondary | ICD-10-CM | POA: Diagnosis not present

## 2014-07-29 DIAGNOSIS — Z79899 Other long term (current) drug therapy: Secondary | ICD-10-CM | POA: Diagnosis not present

## 2014-07-29 DIAGNOSIS — Z6836 Body mass index (BMI) 36.0-36.9, adult: Secondary | ICD-10-CM | POA: Diagnosis not present

## 2014-07-29 DIAGNOSIS — N6012 Diffuse cystic mastopathy of left breast: Secondary | ICD-10-CM | POA: Diagnosis not present

## 2014-07-29 DIAGNOSIS — Z7982 Long term (current) use of aspirin: Secondary | ICD-10-CM | POA: Diagnosis not present

## 2014-07-29 DIAGNOSIS — Z9889 Other specified postprocedural states: Secondary | ICD-10-CM | POA: Diagnosis not present

## 2014-07-29 LAB — BASIC METABOLIC PANEL
ANION GAP: 9 (ref 5–15)
BUN: 10 mg/dL (ref 6–23)
CO2: 28 mmol/L (ref 19–32)
Calcium: 9.7 mg/dL (ref 8.4–10.5)
Chloride: 102 mmol/L (ref 96–112)
Creatinine, Ser: 0.71 mg/dL (ref 0.50–1.10)
GFR calc Af Amer: >90 mL/min (ref >90)
GFR, EST NON AFRICAN AMERICAN: 88 mL/min — AB (ref >90)
Glucose, Bld: 98 mg/dL (ref 70–99)
Potassium: 4 mmol/L (ref 3.5–5.1)
SODIUM: 139 mmol/L (ref 135–145)

## 2014-07-30 ENCOUNTER — Ambulatory Visit
Admission: RE | Admit: 2014-07-30 | Discharge: 2014-07-30 | Disposition: A | Discharge status: Home / Self Care | Payer: Medicare Other | Source: Ambulatory Visit | Attending: Surgery | Admitting: Surgery

## 2014-07-30 DIAGNOSIS — R897 Abnormal histological findings in specimens from other organs, systems and tissues: Secondary | ICD-10-CM

## 2014-07-30 NOTE — H&P (Signed)
Tammy Cooper. Tammy Cooper  Location: [facility] Patient #: [id number] DOB: [birth year] Married / Language: English / Race: White Female  History of Present Illness  Patient words: breast.  The patient is a 64 year old female who presents with a complaint of Breast problems. This is a very pleasant lady who I have operated on for hemorrhoids in the past. She recently presented to the breast center with clear nipple discharge and left breast. She has had 2 separate biopsies of the breast as well as mammograms and MRI. There is a abnormal sclerosing lesion at the 12 position of left breast. Lumpectomy has been recommended for further histologic evaluation. She's had no previous problems regarding her breasts. She is otherwise without complaints. There is no family history of breast cancer.   PMHx Anxiety Disorder Cerebrovascular Accident Hemorrhoids High blood pressure Hypercholesterolemia  Past Surgical History (Tammy Eversole, LPN;  Breast Biopsy Left. multiple Hemorrhoidectomy Hysterectomy (not due to cancer) - Complete Shoulder Surgery Right. Spinal Surgery - Lower Back Tonsillectomy  Diagnostic Studies History (Tammy Eversole, LPN Colonoscopy 5-63 years ago Mammogram within last year Pap Smear >5 years ago  Allergies (Tammy Eversole, LPN;  No Known Drug [id number]  Medication History (Tammy Eversole, LPN;  Crestor (10MG  Tablet, Oral) Active. Aspirin EC (81MG  Tablet DR, Oral) Active. Oscal 500/200 D-3 (500-200MG -UNIT Tablet, Oral) Active. Vitamin D (Cholecalciferol) (1000UNIT Tablet, Oral) Active. CoQ10 Maximum Strength (400MG  Capsule, Oral) Active. Zetia (10MG  Tablet, Oral) Active. Multi-Day (Oral) Active. Fish Oil Active. Hydrochlorothiazide (100MG  Tablet, Oral) Active.  Social History (Tammy Eversole, LPN;  Alcohol use Moderate alcohol use. Caffeine use Carbonated beverages, Coffee. No drug use Tobacco use  Never smoker.  Family History (Tammy Eversole, LPN; Cancer Father. Hypertension Brother, Mother. Migraine Headache Mother.  Pregnancy / Birth History (Tammy Eversole, LPN; Age at menarche 25 years. Age of menopause 51-55 Contraceptive History Oral contraceptives. Gravida 2 Maternal age 65-30 Para 2  Review of Systems (Tammy Eversole LPN; General Not Present- Appetite Loss, Chills, Fatigue, Fever, Night Sweats, Weight Gain and Weight Loss. Skin Present- Dryness. Not Present- Change in Wart/Mole, Hives, Jaundice, New Lesions, Non-Healing Wounds, Rash and Ulcer. HEENT Present- Wears glasses/contact lenses. Not Present- Earache, Hearing Loss, Hoarseness, Nose Bleed, Oral Ulcers, Ringing in the Ears, Seasonal Allergies, Sinus Pain, Sore Throat, Visual Disturbances and Yellow Eyes. Respiratory Present- Snoring. Not Present- Bloody sputum, Chronic Cough, Difficulty Breathing and Wheezing. Breast Present- Nipple Discharge. Not Present- Breast Mass, Breast Pain and Skin Changes. Cardiovascular Not Present- Chest Pain, Difficulty Breathing Lying Down, Leg Cramps, Palpitations, Rapid Heart Rate, Shortness of Breath and Swelling of Extremities. Gastrointestinal Not Present- Abdominal Pain, Bloating, Bloody Stool, Change in Bowel Habits, Chronic diarrhea, Constipation, Difficulty Swallowing, Excessive gas, Gets full quickly at meals, Hemorrhoids, Indigestion, Nausea, Rectal Pain and Vomiting. Female Genitourinary Not Present- Frequency, Nocturia, Painful Urination, Pelvic Pain and Urgency. Musculoskeletal Present- Joint Pain and Joint Stiffness. Not Present- Back Pain, Muscle Pain, Muscle Weakness and Swelling of Extremities. Neurological Not Present- Decreased Memory, Fainting, Headaches, Numbness, Seizures, Tingling, Tremor, Trouble walking and Weakness. Psychiatric Not Present- Anxiety, Bipolar, Change in Sleep Pattern, Depression, Fearful and Frequent crying. Endocrine Present- Hot  flashes. Not Present- Cold Intolerance, Excessive Hunger, Hair Changes, Heat Intolerance and New Diabetes.   Vitals (Tammy Eversole LPN;   Weight: 063.0 lb Height: 63in Body Surface Area: 2.05 m Body Mass Index: 36.88 kg/m BP: 168/68 (Sitting, Left Arm, Standard)    Physical Exam  General Mental Status-Alert. General Appearance-Consistent with stated age. Hydration-Well hydrated.  Voice-Normal.  Head and Neck Head-normocephalic, atraumatic with no lesions or palpable masses. Trachea-midline. Thyroid Gland Characteristics - normal size and consistency.  Eye Eyeball - Bilateral-Extraocular movements intact. Sclera/Conjunctiva - Bilateral-No scleral icterus.  Chest and Lung Exam Chest and lung exam reveals -quiet, even and easy respiratory effort with no use of accessory muscles and on auscultation, normal breath sounds, no adventitious sounds and normal vocal resonance. Inspection Chest Wall - Normal. Back - normal.  Breast Breast - Left-Symmetric. Note: There are no palpable left breast masses. There is ecchymosis from her biopsies Breast - Right-Symmetric. Breast Lump-No Palpable Breast Mass.  Cardiovascular Cardiovascular examination reveals -normal heart sounds, regular rate and rhythm with no murmurs and normal pedal pulses bilaterally.  Abdomen Inspection Inspection of the abdomen reveals - No Hernias. Skin - Scar - no surgical scars. Palpation/Percussion Palpation and Percussion of the abdomen reveal - Soft, Non Tender, No Rebound tenderness, No Rigidity (guarding) and No hepatosplenomegaly. Auscultation Auscultation of the abdomen reveals - Bowel sounds normal.  Neurologic Neurologic evaluation reveals -alert and oriented x 3 with no impairment of recent or remote memory. Mental Status-Normal.  Musculoskeletal Normal Exam - Left-Upper Extremity Strength Normal and Lower Extremity Strength Normal. Normal Exam -  Right-Upper Extremity Strength Normal and Lower Extremity Strength Normal.  Lymphatic Head & Neck  General Head & Neck Lymphatics: Bilateral - Description - Normal. Axillary  General Axillary Region: Bilateral - Description - Normal. Tenderness - Non Tender. Femoral & Inguinal  Generalized Femoral & Inguinal Lymphatics: Bilateral - Description - Normal. Tenderness - Non Tender.    Assessment & Plan ( ABNORMAL BREAST BIOPSY (795.4  R89.7) Impression: I discussed the reasoning for biopsy with her. It is recommended that we proceed with a radioactive seed localized left breast lumpectomy. I discussed this with her in detail. I discussed the risks of surgery which include limited to bleeding, infection, need for further surgery if malignancy is present, continued drainage from her nipple, etc. I also discussed postoperative recovery. She understands and wishes to proceed with surgery.

## 2014-07-31 ENCOUNTER — Ambulatory Visit (HOSPITAL_BASED_OUTPATIENT_CLINIC_OR_DEPARTMENT_OTHER)
Admission: RE | Admit: 2014-07-31 | Discharge: 2014-07-31 | Disposition: A | Discharge status: Home / Self Care | Payer: Medicare Other | Source: Ambulatory Visit | Attending: Surgery | Admitting: Surgery

## 2014-07-31 ENCOUNTER — Ambulatory Visit
Admission: RE | Admit: 2014-07-31 | Discharge: 2014-07-31 | Disposition: A | Discharge status: Home / Self Care | Payer: Medicare Other | Source: Ambulatory Visit | Attending: Surgery | Admitting: Surgery

## 2014-07-31 ENCOUNTER — Ambulatory Visit (HOSPITAL_BASED_OUTPATIENT_CLINIC_OR_DEPARTMENT_OTHER): Payer: Medicare Other | Admitting: Anesthesiology

## 2014-07-31 ENCOUNTER — Encounter (HOSPITAL_BASED_OUTPATIENT_CLINIC_OR_DEPARTMENT_OTHER)
Admission: RE | Disposition: A | Discharge status: Home / Self Care | Payer: Self-pay | Source: Ambulatory Visit | Attending: Surgery

## 2014-07-31 ENCOUNTER — Encounter (HOSPITAL_BASED_OUTPATIENT_CLINIC_OR_DEPARTMENT_OTHER): Payer: Self-pay | Admitting: Anesthesiology

## 2014-07-31 DIAGNOSIS — I509 Heart failure, unspecified: Secondary | ICD-10-CM | POA: Insufficient documentation

## 2014-07-31 DIAGNOSIS — I252 Old myocardial infarction: Secondary | ICD-10-CM | POA: Insufficient documentation

## 2014-07-31 DIAGNOSIS — R9431 Abnormal electrocardiogram [ECG] [EKG]: Secondary | ICD-10-CM | POA: Insufficient documentation

## 2014-07-31 DIAGNOSIS — E780 Pure hypercholesterolemia: Secondary | ICD-10-CM | POA: Insufficient documentation

## 2014-07-31 DIAGNOSIS — M1990 Unspecified osteoarthritis, unspecified site: Secondary | ICD-10-CM | POA: Insufficient documentation

## 2014-07-31 DIAGNOSIS — R921 Mammographic calcification found on diagnostic imaging of breast: Secondary | ICD-10-CM | POA: Diagnosis not present

## 2014-07-31 DIAGNOSIS — N6012 Diffuse cystic mastopathy of left breast: Secondary | ICD-10-CM | POA: Diagnosis not present

## 2014-07-31 DIAGNOSIS — D0512 Intraductal carcinoma in situ of left breast: Secondary | ICD-10-CM | POA: Insufficient documentation

## 2014-07-31 DIAGNOSIS — R897 Abnormal histological findings in specimens from other organs, systems and tissues: Secondary | ICD-10-CM

## 2014-07-31 DIAGNOSIS — I10 Essential (primary) hypertension: Secondary | ICD-10-CM | POA: Insufficient documentation

## 2014-07-31 DIAGNOSIS — Z6836 Body mass index (BMI) 36.0-36.9, adult: Secondary | ICD-10-CM | POA: Insufficient documentation

## 2014-07-31 DIAGNOSIS — Z7982 Long term (current) use of aspirin: Secondary | ICD-10-CM | POA: Insufficient documentation

## 2014-07-31 DIAGNOSIS — Z9889 Other specified postprocedural states: Secondary | ICD-10-CM | POA: Insufficient documentation

## 2014-07-31 DIAGNOSIS — Z8673 Personal history of transient ischemic attack (TIA), and cerebral infarction without residual deficits: Secondary | ICD-10-CM | POA: Insufficient documentation

## 2014-07-31 DIAGNOSIS — Z79899 Other long term (current) drug therapy: Secondary | ICD-10-CM | POA: Insufficient documentation

## 2014-07-31 HISTORY — DX: Unspecified osteoarthritis, unspecified site: M19.90

## 2014-07-31 HISTORY — DX: Anxiety disorder, unspecified: F41.9

## 2014-07-31 HISTORY — PX: BREAST LUMPECTOMY WITH RADIOACTIVE SEED LOCALIZATION: SHX6424

## 2014-07-31 HISTORY — PX: BREAST LUMPECTOMY: SHX2

## 2014-07-31 SURGERY — BREAST LUMPECTOMY WITH RADIOACTIVE SEED LOCALIZATION
Anesthesia: General | Site: Breast | Laterality: Left

## 2014-07-31 MED ORDER — PROPOFOL 10 MG/ML IV BOLUS
INTRAVENOUS | Status: DC | PRN
  Administered 2014-07-31: 200 mg via INTRAVENOUS
  Administered 2014-07-31: 100 mg via INTRAVENOUS

## 2014-07-31 MED ORDER — CEFAZOLIN SODIUM-DEXTROSE 2-3 GM-% IV SOLR: 2.0000 g | INTRAVENOUS | Status: DC

## 2014-07-31 MED ORDER — FENTANYL CITRATE 0.05 MG/ML IJ SOLN
INTRAMUSCULAR | Status: AC
  Filled 2014-07-31: qty 6

## 2014-07-31 MED ORDER — ACETAMINOPHEN 160 MG/5ML PO SOLN: 325.0000 mg | ORAL | Status: DC | PRN

## 2014-07-31 MED ORDER — CEFAZOLIN SODIUM-DEXTROSE 2-3 GM-% IV SOLR
INTRAVENOUS | Status: AC
  Filled 2014-07-31: qty 50

## 2014-07-31 MED ORDER — OXYCODONE HCL 5 MG PO TABS: 5.0000 mg | ORAL_TABLET | ORAL | Status: DC | PRN

## 2014-07-31 MED ORDER — LACTATED RINGERS IV SOLN
INTRAVENOUS | Status: DC
  Administered 2014-07-31: 13:00:00 via INTRAVENOUS
  Administered 2014-07-31: 10 mL/h via INTRAVENOUS
  Administered 2014-07-31: 14:00:00 via INTRAVENOUS

## 2014-07-31 MED ORDER — FENTANYL CITRATE 0.05 MG/ML IJ SOLN
INTRAMUSCULAR | Status: DC | PRN
  Administered 2014-07-31 (×2): 50 ug via INTRAVENOUS

## 2014-07-31 MED ORDER — ACETAMINOPHEN 325 MG PO TABS: 325.0000 mg | ORAL_TABLET | ORAL | Status: DC | PRN

## 2014-07-31 MED ORDER — OXYCODONE HCL 5 MG PO TABS: 5.0000 mg | ORAL_TABLET | Freq: Once | ORAL | Status: DC | PRN

## 2014-07-31 MED ORDER — SODIUM CHLORIDE 0.9 % IV SOLN: 250.0000 mL | INTRAVENOUS | Status: DC | PRN

## 2014-07-31 MED ORDER — ACETAMINOPHEN 650 MG RE SUPP
650.0000 mg | RECTAL | Status: DC | PRN
Start: 2014-07-31 — End: 2014-07-31

## 2014-07-31 MED ORDER — CEFAZOLIN SODIUM-DEXTROSE 2-3 GM-% IV SOLR
2.0000 g | INTRAVENOUS | Status: AC
  Administered 2014-07-31: 2 g via INTRAVENOUS

## 2014-07-31 MED ORDER — HYDROCODONE-ACETAMINOPHEN 5-325 MG PO TABS: 1.0000 | ORAL_TABLET | ORAL | Status: DC | PRN

## 2014-07-31 MED ORDER — FENTANYL CITRATE 0.05 MG/ML IJ SOLN: 50.0000 ug | INTRAMUSCULAR | Status: DC | PRN

## 2014-07-31 MED ORDER — ONDANSETRON HCL 4 MG/2ML IJ SOLN
INTRAMUSCULAR | Status: DC | PRN
  Administered 2014-07-31: 4 mg via INTRAVENOUS

## 2014-07-31 MED ORDER — FENTANYL CITRATE 0.05 MG/ML IJ SOLN
25.0000 ug | INTRAMUSCULAR | Status: DC | PRN
Start: 2014-07-31 — End: 2014-07-31

## 2014-07-31 MED ORDER — MIDAZOLAM HCL 5 MG/5ML IJ SOLN
INTRAMUSCULAR | Status: DC | PRN
  Administered 2014-07-31: 2 mg via INTRAVENOUS

## 2014-07-31 MED ORDER — FENTANYL CITRATE 0.05 MG/ML IJ SOLN: 25.0000 ug | INTRAMUSCULAR | Status: DC | PRN

## 2014-07-31 MED ORDER — SODIUM CHLORIDE 0.9 % IJ SOLN: 3.0000 mL | INTRAMUSCULAR | Status: DC | PRN

## 2014-07-31 MED ORDER — MIDAZOLAM HCL 2 MG/2ML IJ SOLN
1.0000 mg | INTRAMUSCULAR | Status: DC | PRN
Start: 2014-07-31 — End: 2014-07-31

## 2014-07-31 MED ORDER — OXYCODONE HCL 5 MG/5ML PO SOLN
5.0000 mg | Freq: Once | ORAL | Status: DC | PRN
Start: 2014-07-31 — End: 2014-07-31

## 2014-07-31 MED ORDER — BUPIVACAINE-EPINEPHRINE 0.5% -1:200000 IJ SOLN
INTRAMUSCULAR | Status: DC | PRN
  Administered 2014-07-31: 17 mL

## 2014-07-31 MED ORDER — MIDAZOLAM HCL 2 MG/2ML IJ SOLN
INTRAMUSCULAR | Status: AC
  Filled 2014-07-31: qty 2

## 2014-07-31 MED ORDER — LIDOCAINE HCL (PF) 2 % IJ SOLN
INTRAMUSCULAR | Status: DC | PRN
  Administered 2014-07-31: 60 mg via INTRADERMAL

## 2014-07-31 MED ORDER — SODIUM CHLORIDE 0.9 % IJ SOLN: 3.0000 mL | Freq: Two times a day (BID) | INTRAMUSCULAR | Status: DC

## 2014-07-31 MED ORDER — DEXAMETHASONE SODIUM PHOSPHATE 4 MG/ML IJ SOLN
INTRAMUSCULAR | Status: DC | PRN
  Administered 2014-07-31: 10 mg via INTRAVENOUS

## 2014-07-31 MED ORDER — ACETAMINOPHEN 325 MG PO TABS: 650.0000 mg | ORAL_TABLET | ORAL | Status: DC | PRN

## 2014-07-31 MED ORDER — LIDOCAINE HCL (CARDIAC) 20 MG/ML IV SOLN
INTRAVENOUS | Status: DC | PRN
  Administered 2014-07-31: 50 mg via INTRAVENOUS

## 2014-07-31 SURGICAL SUPPLY — 46 items
BLADE HEX COATED 2.75 (ELECTRODE) ×3 IMPLANT
BLADE SURG 15 STRL LF DISP TIS (BLADE) ×1 IMPLANT
BLADE SURG 15 STRL SS (BLADE) ×3
CANISTER SUCT 1200ML W/VALVE (MISCELLANEOUS) IMPLANT
CHLORAPREP W/TINT 26ML (MISCELLANEOUS) ×3 IMPLANT
CLIP TI WIDE RED SMALL 6 (CLIP) IMPLANT
CLOSURE WOUND 1/2 X4 (GAUZE/BANDAGES/DRESSINGS)
COVER BACK TABLE 60X90IN (DRAPES) ×3 IMPLANT
COVER MAYO STAND STRL (DRAPES) ×3 IMPLANT
COVER PROBE W GEL 5X96 (DRAPES) ×3 IMPLANT
DECANTER SPIKE VIAL GLASS SM (MISCELLANEOUS) IMPLANT
DEVICE DUBIN W/COMP PLATE 8390 (MISCELLANEOUS) ×2 IMPLANT
DRAPE LAPAROTOMY 100X72 PEDS (DRAPES) ×3 IMPLANT
DRAPE UTILITY XL STRL (DRAPES) ×3 IMPLANT
DRSG TEGADERM 4X4.75 (GAUZE/BANDAGES/DRESSINGS) ×1 IMPLANT
ELECT REM PT RETURN 9FT ADLT (ELECTROSURGICAL) ×3
ELECTRODE REM PT RTRN 9FT ADLT (ELECTROSURGICAL) ×1 IMPLANT
GLOVE BIOGEL PI IND STRL 7.5 (GLOVE) IMPLANT
GLOVE BIOGEL PI INDICATOR 7.5 (GLOVE) ×2
GLOVE SURG SIGNA 7.5 PF LTX (GLOVE) ×3 IMPLANT
GLOVE SURG SS PI 7.5 STRL IVOR (GLOVE) ×4 IMPLANT
GOWN STRL REUS W/ TWL LRG LVL3 (GOWN DISPOSABLE) ×1 IMPLANT
GOWN STRL REUS W/ TWL XL LVL3 (GOWN DISPOSABLE) ×1 IMPLANT
GOWN STRL REUS W/TWL LRG LVL3 (GOWN DISPOSABLE) ×3
GOWN STRL REUS W/TWL XL LVL3 (GOWN DISPOSABLE) ×3
KIT MARKER MARGIN INK (KITS) ×3 IMPLANT
LIQUID BAND (GAUZE/BANDAGES/DRESSINGS) ×3 IMPLANT
NDL HYPO 25X1 1.5 SAFETY (NEEDLE) ×1 IMPLANT
NEEDLE HYPO 25X1 1.5 SAFETY (NEEDLE) ×3 IMPLANT
NS IRRIG 1000ML POUR BTL (IV SOLUTION) ×1 IMPLANT
PACK BASIN DAY SURGERY FS (CUSTOM PROCEDURE TRAY) ×3 IMPLANT
PENCIL BUTTON HOLSTER BLD 10FT (ELECTRODE) ×3 IMPLANT
SLEEVE SCD COMPRESS KNEE MED (MISCELLANEOUS) ×2 IMPLANT
SPONGE GAUZE 4X4 12PLY STER LF (GAUZE/BANDAGES/DRESSINGS) ×1 IMPLANT
SPONGE LAP 4X18 X RAY DECT (DISPOSABLE) ×3 IMPLANT
STRIP CLOSURE SKIN 1/2X4 (GAUZE/BANDAGES/DRESSINGS) ×1 IMPLANT
SUT MNCRL AB 4-0 PS2 18 (SUTURE) ×3 IMPLANT
SUT SILK 2 0 SH (SUTURE) ×3 IMPLANT
SUT VIC AB 3-0 SH 27 (SUTURE) ×3
SUT VIC AB 3-0 SH 27X BRD (SUTURE) ×1 IMPLANT
SYR CONTROL 10ML LL (SYRINGE) ×3 IMPLANT
TOWEL OR 17X24 6PK STRL BLUE (TOWEL DISPOSABLE) ×3 IMPLANT
TOWEL OR NON WOVEN STRL DISP B (DISPOSABLE) ×3 IMPLANT
TUBE CONNECTING 20'X1/4 (TUBING)
TUBE CONNECTING 20X1/4 (TUBING) IMPLANT
YANKAUER SUCT BULB TIP NO VENT (SUCTIONS) IMPLANT

## 2014-07-31 NOTE — Op Note (Signed)
LEFT BREAST LUMPECTOMY WITH RADIOACTIVE SEED LOCALIZATION  Procedure Note  Tammy Cooper 07/31/2014   Pre-op Diagnosis: abnormal Lesion Left Breast     Post-op Diagnosis: abnormal lesion left breast  Procedure(s): LEFT BREAST LUMPECTOMY WITH RADIOACTIVE SEED LOCALIZATION  Surgeon(s): Coralie Keens, MD  Anesthesia: General  Staff:  Circulator: Glenna Fellows, RN Scrub Person: Romero Liner, CST  Estimated Blood Loss: Minimal               Specimens: sent to path          [facility]   Date: 07/31/2014  Time: 1:50 PM

## 2014-07-31 NOTE — Interval H&P Note (Signed)
History and Physical Interval Note:no change in H and P  07/31/2014 12:26 PM  Tammy Cooper  has presented today for surgery, with the diagnosis of Lesion Left Breast  The various methods of treatment have been discussed with the patient and family. After consideration of risks, benefits and other options for treatment, the patient has consented to  Procedure(s): LEFT BREAST LUMPECTOMY WITH RADIOACTIVE SEED LOCALIZATION (Left) as a surgical intervention .  The patient's history has been reviewed, patient examined, no change in status, stable for surgery.  I have reviewed the patient's chart and labs.  Questions were answered to the patient's satisfaction.     [name]

## 2014-07-31 NOTE — Transfer of Care (Signed)
Immediate Anesthesia Transfer of Care Note  Patient: Tammy Cooper  Procedure(s) Performed: Procedure(s): LEFT BREAST LUMPECTOMY WITH RADIOACTIVE SEED LOCALIZATION (Left)  Patient Location: PACU  Anesthesia Type:General  Level of Consciousness: sedated  Airway & Oxygen Therapy: Patient Spontanous Breathing and Patient connected to face mask oxygen  Post-op Assessment: Report given to RN and Post -op Vital signs reviewed and stable  Post vital signs: Reviewed and stable  Last Vitals:  Filed Vitals:   07/31/14 1400  BP:   Pulse: 86  Temp:   Resp: 14    Complications: No apparent anesthesia complications

## 2014-07-31 NOTE — Anesthesia Preprocedure Evaluation (Addendum)
Anesthesia Evaluation  Patient identified by MRN, date of birth, ID band Patient awake    Reviewed: Allergy & Precautions, NPO status , Patient's Chart, lab work & pertinent test results  History of Anesthesia Complications Negative for: history of anesthetic complications  Airway Mallampati: II  TM Distance: <3 FB Neck ROM: Full    Dental  (+) Teeth Intact   Pulmonary neg pulmonary ROS,  breath sounds clear to auscultation        Cardiovascular hypertension, Pt. on medications - angina- Past MI and - CHF Rhythm:Regular     Neuro/Psych PSYCHIATRIC DISORDERS Anxiety Depression TIA   GI/Hepatic negative GI ROS,   Endo/Other  Morbid obesity  Renal/GU negative Renal ROS     Musculoskeletal  (+) Arthritis -,   Abdominal   Peds  Hematology negative hematology ROS (+)   Anesthesia Other Findings   Reproductive/Obstetrics                            Anesthesia Physical Anesthesia Plan  ASA: III  Anesthesia Plan: General   Post-op Pain Management:    Induction: Intravenous  Airway Management Planned: LMA  Additional Equipment: None  Intra-op Plan:   Post-operative Plan: Extubation in OR  Informed Consent: I have reviewed the patients History and Physical, chart, labs and discussed the procedure including the risks, benefits and alternatives for the proposed anesthesia with the patient or authorized representative who has indicated his/her understanding and acceptance.   Dental advisory given  Plan Discussed with: CRNA and Surgeon  Anesthesia Plan Comments:         Anesthesia Quick Evaluation

## 2014-07-31 NOTE — Anesthesia Postprocedure Evaluation (Signed)
  Anesthesia Post-op Note  Patient: Tammy Cooper  Procedure(s) Performed: Procedure(s): LEFT BREAST LUMPECTOMY WITH RADIOACTIVE SEED LOCALIZATION (Left)  Patient Location: PACU  Anesthesia Type:General  Level of Consciousness: awake and alert   Airway and Oxygen Therapy: Patient Spontanous Breathing  Post-op Pain: none  Post-op Assessment: Post-op Vital signs reviewed, Patient's Cardiovascular Status Stable and Respiratory Function Stable  Post-op Vital Signs: Reviewed  Filed Vitals:   07/31/14 1430  BP: 101/21  Pulse: 75  Temp:   Resp: 13    Complications: No apparent anesthesia complications

## 2014-07-31 NOTE — Discharge Instructions (Signed)
[facility] °Office Phone Number [phone] ° °BREAST BIOPSY/ PARTIAL MASTECTOMY: POST OP INSTRUCTIONS ° °Always review your discharge instruction sheet given to you by the facility where your surgery was performed. ° °IF YOU HAVE DISABILITY OR FAMILY LEAVE FORMS, YOU MUST BRING THEM TO THE OFFICE FOR PROCESSING.  DO NOT GIVE THEM TO YOUR DOCTOR. ° °1. A prescription for pain medication may be given to you upon discharge.  Take your pain medication as prescribed, if needed.  If narcotic pain medicine is not needed, then you may take acetaminophen (Tylenol) or ibuprofen (Advil) as needed. °2. Take your usually prescribed medications unless otherwise directed °3. If you need a refill on your pain medication, please contact your pharmacy.  They will contact our office to request authorization.  Prescriptions will not be filled after 5pm or on week-ends. °4. You should eat very light the first 24 hours after surgery, such as soup, crackers, pudding, etc.  Resume your normal diet the day after surgery. °5. Most patients will experience some swelling and bruising in the breast.  Ice packs and a good support bra will help.  Swelling and bruising can take several days to resolve.  °6. It is common to experience some constipation if taking pain medication after surgery.  Increasing fluid intake and taking a stool softener will usually help or prevent this problem from occurring.  A mild laxative (Milk of Magnesia or Miralax) should be taken according to package directions if there are no bowel movements after 48 hours. °7. Unless discharge instructions indicate otherwise, you may remove your bandages 24-48 hours after surgery, and you may shower at that time.  You may have steri-strips (small skin tapes) in place directly over the incision.  These strips should be left on the skin for 7-10 days.  If your surgeon used skin glue on the incision, you may shower in 24 hours.  The glue will flake off over the  next 2-3 weeks.  Any sutures or staples will be removed at the office during your follow-up visit. °8. ACTIVITIES:  You may resume regular daily activities (gradually increasing) beginning the next day.  Wearing a good support bra or sports bra minimizes pain and swelling.  You may have sexual intercourse when it is comfortable. °a. You may drive when you no longer are taking prescription pain medication, you can comfortably wear a seatbelt, and you can safely maneuver your car and apply brakes. °b. RETURN TO WORK:  ______________________________________________________________________________________ °9. You should see your doctor in the office for a follow-up appointment approximately two weeks after your surgery.  Your doctor’s nurse will typically make your follow-up appointment when she calls you with your pathology report.  Expect your pathology report 2-3 business days after your surgery.  You may call to check if you do not hear from us after three days. °10. OTHER INSTRUCTIONS: _______________________________________________________________________________________________ _____________________________________________________________________________________________________________________________________ °_____________________________________________________________________________________________________________________________________ °_____________________________________________________________________________________________________________________________________ ° °WHEN TO CALL YOUR DOCTOR: °1. Fever over 101.0 °2. Nausea and/or vomiting. °3. Extreme swelling or bruising. °4. Continued bleeding from incision. °5. Increased pain, redness, or drainage from the incision. ° °The clinic staff is available to answer your questions during regular business hours.  Please don’t hesitate to call and ask to speak to one of the nurses for clinical concerns.  If you have a medical emergency, go to the nearest  emergency room or call 911.  A surgeon from [facility] is always on call at the hospital. ° °For further questions, please visit [email or web address]  ° ° ° °  Post Anesthesia Home Care Instructions ° °Activity: °Get plenty of rest for the remainder of the day. A responsible adult should stay with you for 24 hours following the procedure.  °For the next 24 hours, DO NOT: °-Drive a car °-Operate machinery °-Drink alcoholic beverages °-Take any medication unless instructed by your physician °-Make any legal decisions or sign important papers. ° °Meals: °Start with liquid foods such as gelatin or soup. Progress to regular foods as tolerated. Avoid greasy, spicy, heavy foods. If nausea and/or vomiting occur, drink only clear liquids until the nausea and/or vomiting subsides. Call your physician if vomiting continues. ° °Special Instructions/Symptoms: °Your throat may feel dry or sore from the anesthesia or the breathing tube placed in your throat during surgery. If this causes discomfort, gargle with warm salt water. The discomfort should disappear within 24 hours. ° °If you had a scopolamine patch placed behind your ear for the management of post- operative nausea and/or vomiting: ° °1. The medication in the patch is effective for 72 hours, after which it should be removed.  Wrap patch in a tissue and discard in the trash. Wash hands thoroughly with soap and water. °2. You may remove the patch earlier than 72 hours if you experience unpleasant side effects which may include dry mouth, dizziness or visual disturbances. °3. Avoid touching the patch. Wash your hands with soap and water after contact with the patch. °  ° °

## 2014-08-01 ENCOUNTER — Encounter (HOSPITAL_BASED_OUTPATIENT_CLINIC_OR_DEPARTMENT_OTHER): Payer: Self-pay | Admitting: Surgery

## 2014-08-01 NOTE — Op Note (Signed)
[name], [name]     ACCOUNT NO.:  [id number]  MEDICAL RECORD NO.:  [id number]  LOCATION:                                 FACILITY:  PHYSICIAN:  Coralie Keens, M.D. DATE OF BIRTH:  1950/10/24  DATE OF PROCEDURE:  07/31/2014 DATE OF DISCHARGE:  07/31/2014                              OPERATIVE REPORT   PREOPERATIVE DIAGNOSIS:  Abnormal lesion on left breast.  POSTOPERATIVE DIAGNOSIS:  Abnormal lesion on left breast.  PROCEDURE:  Radiation seed localized left breast lumpectomy.  SURGEON:  Coralie Keens, M.D.  ANESTHESIA:  General and 0.5% Marcaine.  ESTIMATED BLOOD LOSS:  Minimal.  INDICATIONS:  This is a 64 year old female, who underwent stereotactic biopsy of the left breast for an abnormality.  The  pathology showed abnormal cells, so decision was made to proceed to the operating room for lumpectomy for complete histologic evaluation.  FINDINGS:  The radioactive seed and previous localization marker were both found in the biopsy specimen with the Neoprobe and postoperative x- ray.  PROCEDURE IN DETAIL:  The patient was brought to the operating room, identified as Tammy Cooper. Tammy Cooper.  She had been identified in the holding area and the radioactive seed was confirmed to be until the left breast with a Neoprobe.  She was placed supine on the operating table and general anesthesia was  induced.  Her left breast was then prepped and draped in usual sterile fashion.  Again using Neoprobe, I identified the area of increased uptake in the upper outer quadrant of the left breast.  I anesthetized the skin with Marcaine.  I made a longitudinal incision with a scalpel.  I took this down to the breast tissue with electrocautery.  I then performed a lumpectomy with the aid of the Neoprobe coming circumferentially around the radioactive seed.  I took this down almost to the chest wall.  There was a very firm breast tissue in the surrounding area.  I used a  silk suture in the specimen to help to elevate it out of the incision.  Once it was completely removed, I then confirmed that the again radioactive seed was in the specimen with the Neoprobe.  The breast was again examined and no other uptake was identified in the breast.  I then marked all quadrants with a marker paint.  X-ray was performed and confirmed that the radioactive seed and previous marker were in the  specimen.  This was then sent to Pathology for evaluation.  I achieved hemostasis in the wound with the cautery.  I then anesthetized it further with Marcaine.  I closed the subcutaneous tissue with interrupted 3-0 Vicryl sutures.  I closed the skin with a running 4-0 Monocryl.  Skin glue was then applied.  The patient tolerated the procedure well.  All the counts were correct at the end of procedure.  The patient was then extubated in the operating room and taken in a stable condition to the recovery room.     Coralie Keens, M.D.     DB/MEDQ  D:  07/31/2014  T:  08/01/2014  Job:  [id number]

## 2014-08-04 NOTE — Anesthesia Postprocedure Evaluation (Deleted)
  Anesthesia Post-op Note  Patient: Tammy Cooper  Procedure(s) Performed: Procedure(s): LEFT BREAST LUMPECTOMY WITH RADIOACTIVE SEED LOCALIZATION (Left)  Patient Location: PACU  Anesthesia Type:General  Level of Consciousness: awake and alert   Airway and Oxygen Therapy: Patient Spontanous Breathing  Post-op Pain: none  Post-op Assessment: Post-op Vital signs reviewed, Patient's Cardiovascular Status Stable, Respiratory Function Stable, Patent Airway, No signs of Nausea or vomiting and Pain level controlled  Post-op Vital Signs: Reviewed and stable  Last Vitals:  Filed Vitals:   07/31/14 1509  BP: 113/65  Pulse: 65  Temp: 36.4 C  Resp: 16    Complications: No apparent anesthesia complications

## 2014-08-06 ENCOUNTER — Telehealth: Payer: Self-pay | Admitting: *Deleted

## 2014-08-06 NOTE — Telephone Encounter (Signed)
Received referral from [facility].  Called pt and confirmed 08/18/14 appt w/ her.  Mailed before appt letter, calendar, welcoming packet & intake form to pt.  Emailed Engineer, civil (consulting) at [employer] to make her aware.  Placed a copy of records in Dr. Geralyn Flash box and took one to HIM to scan.

## 2014-08-14 ENCOUNTER — Encounter: Payer: Self-pay | Admitting: Cardiology

## 2014-08-18 ENCOUNTER — Encounter: Payer: Self-pay | Admitting: Hematology and Oncology

## 2014-08-18 ENCOUNTER — Ambulatory Visit: Payer: Medicare Other

## 2014-08-18 ENCOUNTER — Telehealth: Payer: Self-pay | Admitting: Hematology and Oncology

## 2014-08-18 ENCOUNTER — Ambulatory Visit (HOSPITAL_BASED_OUTPATIENT_CLINIC_OR_DEPARTMENT_OTHER): Payer: Medicare Other | Admitting: Hematology and Oncology

## 2014-08-18 VITALS — BP 123/34 | HR 77 | Temp 97.5°F | Resp 18 | Ht 63.0 in | Wt 205.2 lb

## 2014-08-18 DIAGNOSIS — D0512 Intraductal carcinoma in situ of left breast: Secondary | ICD-10-CM

## 2014-08-18 DIAGNOSIS — Z171 Estrogen receptor negative status [ER-]: Secondary | ICD-10-CM | POA: Insufficient documentation

## 2014-08-18 DIAGNOSIS — C50412 Malignant neoplasm of upper-outer quadrant of left female breast: Secondary | ICD-10-CM | POA: Insufficient documentation

## 2014-08-18 MED ORDER — TAMOXIFEN CITRATE 20 MG PO TABS: 20.0000 mg | ORAL_TABLET | Freq: Every day | ORAL | Status: DC

## 2014-08-18 NOTE — Progress Notes (Signed)
[facility] CONSULT NOTE  Patient Care Team: Carol Ada, MD as PCP - General (Family Medicine) Sueanne Margarita, MD as Consulting Physician (Cardiology)  CHIEF COMPLAINTS/PURPOSE OF CONSULTATION:  Left breast DCIS  HISTORY OF PRESENTING ILLNESS:  Tammy Cooper 64 y.o. female is here because of recent diagnosis of left breast DCIS. Patient had leakage from the breast starting last year and she brought to the attention of her primary care physician. She was then referred to a mammogram. The mammogram initially did not show any abnormalities that led to a diagnostic mammogram in January 2016. It showed suspicious calcifications. She then underwent a biopsy which showed usual ductal hyperplasia. She then undergone a breast MRI on 05/22/2014 that revealed a 1.2 cm left breast enhancement. She underwent a lumpectomy on 07/31/2014 that revealed DCIS high-grade with calcifications 1 cm in size that was ER/PR positive. She has been referred to me for discussion regarding adjuvant treatment options.  I reviewed her records extensively and collaborated the history with the patient.  SUMMARY OF ONCOLOGIC HISTORY:   Breast cancer of upper-outer quadrant of left female breast   05/22/2014 Breast MRI Left breast 12:00 position irregular nodular enhancement on 0.6 x 1.2 x 0.8 cm, no abnormal lymph nodes   07/31/2014 Surgery Left breast lumpectomy: DCIS with calcifications 1 cm size ER 99%, PR 99%    MEDICAL HISTORY:  Past Medical History  Diagnosis Date  . Hypertension   . Hypercholesteremia   . Stenosing tenosynovitis     Of multiple digits- Dr Grandville Silos  . Stroke     H/O TIA in 2006, MRA clear,arteriogram was done which was normal  . Arthritis   . Anxiety     SURGICAL HISTORY: Past Surgical History  Procedure Laterality Date  . Tonsillectomy  1958  . Rotator cuff repair  2005    rt shoulder  . Arthrogram knee  2010    lt knee  . Back surgery  07/12/2010    stenosis  L4 + L5  . Anal skin tag removal  10/21/10  . Vaginal hysterectomy  2003    total hyst-BSO-Ant/post repair  . Colonoscopy    . Hemorroidectomy    . Breast lumpectomy with radioactive seed localization Left 07/31/2014    Procedure: LEFT BREAST LUMPECTOMY WITH RADIOACTIVE SEED LOCALIZATION;  Surgeon: Coralie Keens, MD;  Location: [facility];  Service: General;  Laterality: Left;    SOCIAL HISTORY: History   Social History  . Marital Status: Married    Spouse Name: N/A  . Number of Children: N/A  . Years of Education: N/A   Occupational History  . Not on file.   Social History Main Topics  . Smoking status: Never Smoker   . Smokeless tobacco: Never Used  . Alcohol Use: Yes     Comment: ocassionally  . Drug Use: No  . Sexual Activity: Not on file   Other Topics Concern  . Not on file   Social History Narrative    FAMILY HISTORY: Family History  Problem Relation Age of Onset  . Cancer Father     lung    ALLERGIES:  is allergic to lipitor.  MEDICATIONS:  Current Outpatient Prescriptions  Medication Sig Dispense Refill  . ALPRAZolam (XANAX) 0.5 MG tablet Take 0.5 mg by mouth at bedtime as needed for anxiety. Takes 1/2    . aspirin 81 MG tablet Take 81 mg by mouth daily.      . calcium-vitamin D (OSCAL) 250-125  MG-UNIT per tablet Take 1 tablet by mouth daily.      . cholecalciferol (VITAMIN D) 1000 UNITS tablet Take 1,000 Units by mouth daily.    [name] Co-Enzyme Q10 200 MG CAPS Take 200 mg by mouth daily.    [name] ezetimibe (ZETIA) 10 MG tablet Take 1 tablet (10 mg total) by mouth daily. 90 tablet 3  . HYDROcodone-acetaminophen (NORCO) 5-325 MG per tablet Take 1-2 tablets by mouth every 4 (four) hours as needed. 30 tablet 0  . Multiple Vitamins-Minerals (MULTIVITAMIN PO) Take by mouth 2 (two) times daily. Vemma vitamin    . Omega-3 Fatty Acids (FISH OIL PO) Take 1,000 mg by mouth daily.     . rosuvastatin (CRESTOR) 10 MG tablet 1/2 tablet by mouth daily 45  tablet 3  . tamoxifen (NOLVADEX) 20 MG tablet Take 1 tablet (20 mg total) by mouth daily. 90 tablet 3  . telmisartan-hydrochlorothiazide (MICARDIS HCT) 40-12.5 MG per tablet Take 1 tablet by mouth daily. 90 tablet 3   No current facility-administered medications for this visit.    REVIEW OF SYSTEMS:   Constitutional: Denies fevers, chills or abnormal night sweats Eyes: Denies blurriness of vision, double vision or watery eyes Ears, nose, mouth, throat, and face: Denies mucositis or sore throat Respiratory: Denies cough, dyspnea or wheezes Cardiovascular: Denies palpitation, chest discomfort or lower extremity swelling Gastrointestinal:  Denies nausea, heartburn or change in bowel habits Skin: Rash on the chest several cysts in surgery currently in second course of antibiotics Lymphatics: Denies new lymphadenopathy or easy bruising Neurological:Denies numbness, tingling or new weaknesses Behavioral/Psych: Mood is stable, no new changes  Breast:  Denies any palpable lumps or discharge All other systems were reviewed with the patient and are negative.  PHYSICAL EXAMINATION: ECOG PERFORMANCE STATUS: 1 - Symptomatic but completely ambulatory  Filed Vitals:   08/18/14 1525  BP: 123/34  Pulse: 77  Temp: 97.5 F (36.4 C)  Resp: 18   Filed Weights   08/18/14 1525  Weight: 205 lb 3.2 oz (93.078 kg)    GENERAL:alert, no distress and comfortable SKIN: skin color, texture, turgor are normal, no rashes or significant lesions EYES: normal, conjunctiva are pink and non-injected, sclera clear OROPHARYNX:no exudate, no erythema and lips, buccal mucosa, and tongue normal  NECK: supple, thyroid normal size, non-tender, without nodularity LYMPH:  no palpable lymphadenopathy in the cervical, axillary or inguinal LUNGS: clear to auscultation and percussion with normal breathing effort HEART: regular rate & rhythm and no murmurs and no lower extremity edema ABDOMEN:abdomen soft, non-tender and  normal bowel sounds Musculoskeletal:no cyanosis of digits and no clubbing  PSYCH: alert & oriented x 3 with fluent speech NEURO: no focal motor/sensory deficits   LABORATORY DATA:  I have reviewed the data as listed Lab Results  Component Value Date   WBC 6.6 10/19/2010   HGB 12.7 10/19/2010   HCT 38.1 10/19/2010   MCV 85.8 10/19/2010   PLT 303 10/19/2010   Lab Results  Component Value Date   NA 139 07/29/2014   K 4.0 07/29/2014   CL 102 07/29/2014   CO2 28 07/29/2014    RADIOGRAPHIC STUDIES: I have personally reviewed the radiological reports and agreed with the findings in the report.  ASSESSMENT AND PLAN:  DCIS left breast: High-grade ER 99% PR 99% no comedo necrosis status post lumpectomy 07/31/2014.  Pathology and radiology review: I discussed the details of pathology report and radiology results. I explained to her the difference between invasive breast cancer and in situ  breast cancer. I discussed that the ER/PR being positive she would benefit from adjuvant antiestrogen therapy.  Prognosis: Based on [facility] prognostic nomogram, with antiestrogen therapy and radiation therapy her 5 year risk of recurrence of 1% in 10 year risk is a 2%.  Recommendation: 1. Adjuvant radiation therapy followed by 2. Adjuvant tamoxifen daily for 5 years  Tamoxifen counseling:We discussed the risks and benefits of tamoxifen. These include but not limited to insomnia, hot flashes, mood changes, vaginal dryness, and weight gain. Although rare, serious side effects including risk of blood clots were also discussed. We strongly believe that the benefits far outweigh the risks. Patient understands these risks and consented to starting treatment. Planned treatment duration is 5 years.  All questions were answered. The patient knows to call the clinic with any problems, questions or concerns.    Rulon Eisenmenger, MD 4:28 PM

## 2014-08-18 NOTE — Progress Notes (Signed)
Checked in new pt with no financial concerns prior to seeing the dr.  Pt has my card for any billing questions or concerns. ° °

## 2014-08-18 NOTE — Telephone Encounter (Signed)
per pof to sch pt appt-gave pt copy of sch °

## 2014-08-19 ENCOUNTER — Other Ambulatory Visit: Payer: Self-pay | Admitting: Surgery

## 2014-08-19 ENCOUNTER — Encounter: Payer: Self-pay | Admitting: Radiation Oncology

## 2014-08-19 NOTE — Progress Notes (Signed)
Location of Breast Cancer: Left Breast 12 o'clock position,Upper Outer Quadrant   Histology per Pathology Report: Diagnosis 05/28/14: Breast, left, needle core biopsy, 12 o'clock- SCLEROSING LESION SHOWING FIBROCYSTIC CHANGES WITH ADENOSIS, USUAL DUCTAL HYPERPLASIA AND CALCIFICATIONS.  Receptor Status: ER(  +99% ), PR ( +99%  ), Her2-neu (   )  Did patient present with symptoms (if so, please note symptoms) or was this found on screening mammography? Patient found herself ( clear leakage starting last year brought attention to her Primary MD) then referred for mammogram  Past/Anticipated interventions by surgeon, if [facility]:ENIDPOEUM 07/31/14: Breast, lumpectomy, Left- DUCTAL CARCINOMA IN SITU WITH CALCIFICATIONS.- MARGINS NOT INVOLVED.- CLOSEST MARGIN MEDIAL AND 0.2 CM.- FIBROCYSTIC CHANGES WITH EXTENSIVE CALCIFICATION.1 of 3 Microscopic Comment :BREAST, IN SITU CARCINOMA , Dr. Mercy Riding, MD Seen 08/11/14 by Dr. Rush Farmer, rash and redness around incision site, RX for Keflex,  532m  1 capsule 4x day,#20 capsules  Past/Anticipated interventions by medical oncology, if any: Chemotherapy : Dr. GLindi Adiewas seen 08/18/14,   Lymphedema issues, if any:  No  Pain issues, if any:  NO  SAFETY ISSUES:   Prior radiation? NO  Pacemaker/ICD? NO  Possible current pregnancy? NO  Is the patient on methotrexate? NO  Current Complaints / other details:  Hx CVA, (TIA 2006),Anxiety, HTN, Married,  GG2P2 menarche age 64 menopause age [age] oral contraceptives, Hysterectomy 2003 (not due to cancer), Rt rotator cuff  Shoulder surgery, lower back  surgery (L4--L5) Non smoker, n smokeless tobacco, yes to alcohol use, no drug use, Father -cancer,Mother-HTN,   Allergies: NDKA Patient has a history of back surgery and would benefit from a roll under her knees when simulated and treated.  Patient demonstrates full ROM of her upper extremities. Denies nipple inversion. Taking prednisone for left chest rash  following surgery Seen by GLindi Adie Patient understands not to start antiestrogen until radiation is complete Redness below left breast incision improving Reports her left nipple intermittent leaks clear fluid Retired [employer] [place]

## 2014-08-20 ENCOUNTER — Ambulatory Visit
Admission: RE | Admit: 2014-08-20 | Discharge: 2014-08-20 | Disposition: A | Discharge status: Home / Self Care | Payer: Medicare Other | Source: Ambulatory Visit | Attending: Radiation Oncology | Admitting: Radiation Oncology

## 2014-08-20 ENCOUNTER — Telehealth: Payer: Self-pay | Admitting: *Deleted

## 2014-08-20 ENCOUNTER — Encounter: Payer: Self-pay | Admitting: Radiation Oncology

## 2014-08-20 VITALS — BP 135/64 | HR 69 | Temp 98.0°F | Resp 16 | Ht 63.0 in | Wt 205.5 lb

## 2014-08-20 DIAGNOSIS — C50412 Malignant neoplasm of upper-outer quadrant of left female breast: Secondary | ICD-10-CM | POA: Diagnosis present

## 2014-08-20 DIAGNOSIS — D0592 Unspecified type of carcinoma in situ of left breast: Secondary | ICD-10-CM

## 2014-08-20 HISTORY — DX: Malignant neoplasm of unspecified site of unspecified female breast: C50.919

## 2014-08-20 NOTE — Progress Notes (Signed)
See progress note under physician encounter. 

## 2014-08-20 NOTE — Telephone Encounter (Signed)
Called patient to inform of mammogram on 09/17/14- arrival time - 9:40 am @ [facility], spoke with patient and she is aware of this test.

## 2014-08-20 NOTE — Progress Notes (Signed)
Radiation Oncology         [phone] ________________________________  Initial outpatient Consultation  Name: Tammy Cooper MRN: [id number]  Date: 08/20/2014  DOB: 1950/08/19  VH:[id number] Weyman Croon, MD  Coralie Keens, MD   REFERRING PHYSICIAN: Coralie Keens, MD  DIAGNOSIS: Left breast Stage 0, DCIS, ER PR positive, high-grade    ICD-9-CM ICD-10-CM   1. Breast cancer of upper-outer quadrant of left female breast 174.4 C50.412      HISTORY OF PRESENT ILLNESS::Tammy Cooper is a 64 y.o. female who presented with clear nipple discharge on the left breast. Mammography revealed calcifications in the left breast. Ductogram could not be performed as a duct could not be cannulated. MRI of breast on 05/22/14, revealed a 12 o'clock lesion in the left breast, biopsy was recommended.There were no other suspicious findings. Biopsy of the lesion on 05/28/14 showed a sclerosing lesion. Another biopsy on 06/13/14 of the left breast showed no evidence of malignancy. lumpectomy was performed on 07/31/14 which revealed 1 cm of high-grade DCIS. The margins are negative by 2 mm. Per path report there was no necrosis.  She has been discussed at tumor board with recommendations of post-op mammography.  After surgery, the pt developed redness and itchiness of the breast. The pt was inially prescribed keflex. Pt reports that she was then switched to prednisone. The surgeon felt that infection was unlikely. Pt's skin is peeling, but denies the area being warm to the touch or being sore. Pt is scheduled to see her surgeon on 09/19/14. The pt was instructed to take a hormone pill after radiation by her medical oncologist.  PREVIOUS RADIATION THERAPY: No; no prior cancers either  PAST MEDICAL HISTORY:  has a past medical history of Hypertension; Hypercholesteremia; Stenosing tenosynovitis; Arthritis; Anxiety; Breast cancer (05/28/14); and Stroke.    PAST SURGICAL HISTORY: Past Surgical  History  Procedure Laterality Date  . Tonsillectomy  1958  . Rotator cuff repair  2005    rt shoulder  . Arthrogram knee  2010    lt knee  . Back surgery  07/12/2010    stenosis L4 + L5  . Anal skin tag removal  10/21/10  . Vaginal hysterectomy  2003    total hyst-BSO-Ant/post repair  . Colonoscopy    . Hemorroidectomy    . Breast lumpectomy with radioactive seed localization Left 07/31/2014    Procedure: LEFT BREAST LUMPECTOMY WITH RADIOACTIVE SEED LOCALIZATION;  Surgeon: Coralie Keens, MD;  Location: [facility];  Service: General;  Laterality: Left;    FAMILY HISTORY: family history includes Cancer in her father; Hypertension in her mother and sister.  SOCIAL HISTORY:  reports that she has never smoked. She has never used smokeless tobacco. She reports that she drinks alcohol. She reports that she does not use illicit drugs.  ALLERGIES: Lipitor  MEDICATIONS:  Current Outpatient Prescriptions  Medication Sig Dispense Refill  . aspirin 81 MG tablet Take 81 mg by mouth daily.      . calcium-vitamin D (OSCAL) 250-125 MG-UNIT per tablet Take 1 tablet by mouth daily.      . cholecalciferol (VITAMIN D) 1000 UNITS tablet Take 1,000 Units by mouth daily.    [name] Co-Enzyme Q10 200 MG CAPS Take 200 mg by mouth daily.    [name] ezetimibe (ZETIA) 10 MG tablet Take 1 tablet (10 mg total) by mouth daily. 90 tablet 3  . Multiple Vitamins-Minerals (MULTIVITAMIN PO) Take by mouth 2 (two) times daily. Vemma vitamin    .  Omega-3 Fatty Acids (FISH OIL PO) Take 1,000 mg by mouth daily.     . rosuvastatin (CRESTOR) 10 MG tablet 1/2 tablet by mouth daily 45 tablet 3  . telmisartan-hydrochlorothiazide (MICARDIS HCT) 40-12.5 MG per tablet Take 1 tablet by mouth daily. 90 tablet 3  . tamoxifen (NOLVADEX) 20 MG tablet Take 1 tablet (20 mg total) by mouth daily. (Patient not taking: Reported on 08/20/2014) 90 tablet 3   No current facility-administered medications for this encounter.    REVIEW OF  SYSTEMS:  Notable for that above.   PHYSICAL EXAM:  height is 5' 3"  (1.6 m) and weight is 205 lb 8 oz (93.214 kg). Her oral temperature is 98 F (36.7 C). Her blood pressure is 135/64 and her pulse is 69. Her respiration is 16 and oxygen saturation is 100%.   General: Alert and oriented, in no acute distress HEENT: Head is normocephalic. Extraocular movements are intact. Oropharynx is clear. Neck: Neck is supple, no palpable cervical or supraclavicular lymphadenopathy. Heart: Regular in rate and rhythm with no murmurs, rubs, or gallops. Chest: Clear to auscultation bilaterally, with no rhonchi, wheezes, or rales. Abdomen: Soft, nontender, nondistended, with no rigidity or guarding. Extremities: No cyanosis or edema. Lymphatics: see Neck Exam Skin: erythematous dry rash, left lateral breast Musculoskeletal: symmetric strength and muscle tone throughout. Neurologic: Cranial nerves II through XII are grossly intact. No obvious focalities. Speech is fluent.  Psychiatric: Judgment and insight are intact. Affect is appropriate. Breasts: right breast unremarkable, left breast with  erythematous dry rash of lateral breast. UOQ L breast lumpectomy scar healing. No axillary adenopathy   ECOG = 0  0 - Asymptomatic (Fully active, able to carry on all predisease activities without restriction)  1 - Symptomatic but completely ambulatory (Restricted in physically strenuous activity but ambulatory and able to carry out work of a light or sedentary nature. For example, light housework, office work)  2 - Symptomatic, <50% in bed during the day (Ambulatory and capable of all self care but unable to carry out any work activities. Up and about more than 50% of waking hours)  3 - Symptomatic, >50% in bed, but not bedbound (Capable of only limited self-care, confined to bed or chair 50% or more of waking hours)  4 - Bedbound (Completely disabled. Cannot carry on any self-care. Totally confined to bed or  chair)  5 - Death   Eustace Pen MM, Creech RH, Tormey DC, et al. [phone]). "Toxicity and response criteria of the [facility]". [place] Oncol. 5 (6): 649-55   LABORATORY DATA:  Lab Results  Component Value Date   WBC 6.6 10/19/2010   HGB 12.7 10/19/2010   HCT 38.1 10/19/2010   MCV 85.8 10/19/2010   PLT 303 10/19/2010   CMP     Component Value Date/Time   NA 139 07/29/2014 1453   K 4.0 07/29/2014 1453   CL 102 07/29/2014 1453   CO2 28 07/29/2014 1453   GLUCOSE 98 07/29/2014 1453   BUN 10 07/29/2014 1453   CREATININE 0.71 07/29/2014 1453   CALCIUM 9.7 07/29/2014 1453   PROT 6.6 09/09/2013 0810   ALBUMIN 4.0 09/09/2013 0810   AST 25 09/09/2013 0810   ALT 25 09/09/2013 0810   ALKPHOS 83 09/09/2013 0810   BILITOT 0.6 09/09/2013 0810   GFRNONAA 88* 07/29/2014 1453   GFRAA >90 07/29/2014 1453         RADIOGRAPHY: Mm Breast Surgical Specimen  07/31/2014   CLINICAL DATA:  Radioactive  seed localization was performed of a previously biopsied sclerosing lesion in the left breast.  EXAM: SPECIMEN RADIOGRAPH OF THE LEFT BREAST  COMPARISON:  Previous exam(s).  FINDINGS: Status post excision of the left breast. The radioactive seed and biopsy marker clip are present, completely intact, and were marked for pathology.  IMPRESSION: Specimen radiograph of the left breast.   Electronically Signed   By: Curlene Dolphin M.D.   On: 07/31/2014 13:44   Mm Lt Radioactive Seed Loc Mammo Guide  07/30/2014   CLINICAL DATA:  64 year old female with a sclerosing lesion in the left breast possibly representing a complex sclerosing lesion at site of a dumbbell shaped biopsy marking clip post MRI guided biopsy of a left breast lesion on 05/28/2014.  EXAM: MAMMOGRAPHIC GUIDED RADIOACTIVE SEED LOCALIZATION OF THE LEFT BREAST  COMPARISON:  Previous exam(s).  FINDINGS: Patient presents for radioactive seed localization prior to left breast excision. I met with the patient and we discussed the  procedure of seed localization including benefits and alternatives. We discussed the high likelihood of a successful procedure. We discussed the risks of the procedure including infection, bleeding, tissue injury and further surgery. We discussed the low dose of radioactivity involved in the procedure. Informed, written consent was given.  The usual time-out protocol was performed immediately prior to the procedure.  Using mammographic guidance, sterile technique, 2% lidocaine and an I-125 radioactive seed, the dumbbell shaped biopsy marking clip was localized using a lateral approach. The follow-up mammogram images confirm the seed in the expected location and were marked for Dr. Ninfa Linden.  Follow-up survey of the patient confirms presence of the radioactive seed.  Order number of I-125 seed:  [id number].  Total activity:  0.254 mCi  Reference Date: 07/14/2014  The patient tolerated the procedure well and was released from the [facility]. She was given instructions regarding seed removal.  IMPRESSION: Radioactive seed localization left breast. No apparent complications.   Electronically Signed   By: Everlean Alstrom M.D.   On: 07/30/2014 11:29      IMPRESSION/PLAN: The pt has high grade dcis and radiation is advised. Discussed radiotherapy techniques and CT Sim to the pt. Discussed that the pt will have 5-6 weeks of radiotherapy and a post operative mammogram. Radiotherapy will most likely be scheduled in June.  It was a pleasure meeting the patient today. We discussed the risks, benefits, and side effects of radiotherapy.  I would give the patient a few more weeks to heal following surgery before starting treatment planning. We spoke about acute effects including skin irritation and fatigue as well as much less common late effects including lung and heart irritation. We spoke about the latest technology that is used to minimize the risk of late effects for breast cancer patients undergoing radiotherapy. No  guarantees of treatment were given. The patient is enthusiastic about proceeding with treatment. I look forward to participating in the patient's care.    This document serves as a record of services personally performed by Eppie Gibson, MD. It was created on her behalf by Darcus Austin, a trained medical scribe. The creation of this record is based on the scribe's personal observations and the provider's statements to them. This document has been checked and approved by the attending provider.    __________________________________________   Eppie Gibson, MD

## 2014-09-02 ENCOUNTER — Other Ambulatory Visit (INDEPENDENT_AMBULATORY_CARE_PROVIDER_SITE_OTHER): Payer: Medicare Other | Admitting: *Deleted

## 2014-09-02 DIAGNOSIS — E785 Hyperlipidemia, unspecified: Secondary | ICD-10-CM

## 2014-09-02 DIAGNOSIS — Z79899 Other long term (current) drug therapy: Secondary | ICD-10-CM

## 2014-09-02 LAB — HEPATIC FUNCTION PANEL
ALBUMIN: 3.7 g/dL (ref 3.5–5.2)
ALT: 24 U/L (ref 0–35)
AST: 20 U/L (ref 0–37)
Alkaline Phosphatase: 85 U/L (ref 39–117)
BILIRUBIN TOTAL: 0.4 mg/dL (ref 0.2–1.2)
Bilirubin, Direct: 0 mg/dL (ref 0.0–0.3)
Total Protein: 6.4 g/dL (ref 6.0–8.3)

## 2014-09-04 ENCOUNTER — Ambulatory Visit: Payer: 59 | Admitting: Pharmacist

## 2014-09-04 LAB — NMR LIPOPROFILE WITH LIPIDS
Cholesterol, Total: 160 mg/dL (ref 100–199)
HDL Particle Number: 38.8 umol/L (ref >=30.5)
HDL Size: 8.7 nm — ABNORMAL LOW (ref >=9.2)
HDL-C: 55 mg/dL (ref >39)
LARGE HDL: 4.9 umol/L (ref >=4.8)
LARGE VLDL-P: 10.2 nmol/L — AB (ref <=2.7)
LDL (calc): 64 mg/dL (ref 0–99)
LDL Particle Number: 1309 nmol/L — ABNORMAL HIGH (ref <1000)
LDL Size: 20 nm (ref >=20.8)
LP-IR SCORE: 77 — AB (ref <=45)
SMALL LDL PARTICLE NUMBER: 876 nmol/L — AB (ref <=527)
Triglycerides: 206 mg/dL — ABNORMAL HIGH (ref 0–149)
VLDL Size: 54.5 nm — ABNORMAL HIGH (ref <=46.6)

## 2014-09-10 ENCOUNTER — Ambulatory Visit (INDEPENDENT_AMBULATORY_CARE_PROVIDER_SITE_OTHER): Payer: Medicare Other | Admitting: Pharmacist

## 2014-09-10 DIAGNOSIS — E785 Hyperlipidemia, unspecified: Secondary | ICD-10-CM

## 2014-09-10 NOTE — Patient Instructions (Signed)
Your cholesterol looks good.  Continue your current medications.    We will recheck your labs in 6 months to make sure there is no change once you start tamoxifen.

## 2014-09-10 NOTE — Progress Notes (Signed)
HPI Patient here for followup due to h/o elevated LDL, TG, and non-HDL and trouble tolerating statins in the past. She is tolerating Crestor 5 mg qd + Zetia 10 mg qd well at this time.  She was on Crestor 10 mg and Zetia daily up until late 12/2012 when due to muscle and joint aches she reduced to Crestor 5 mg qd + Zetia 10 mg qd.  She has tolerated that regimen since then.  Since last visit, she was diagnosed with breast cancer and has undergone lumpectomy.  She will begin radiation treatment in June and start tamoxifen in July.   Cardiac risk factors possible TIA 2006, age, HTN.  Patient was on HRT for 10 years, and thinks she was on HRT (estrogen) at time of TIA  Target LDL < 100 preferred given possible TIA (LDL-P goal < 1000 if possible).  Target non-HDL < 130 preferred.  Medications currently taking: Crestor 5 mg qd, Zetia 10 mg qd, Co-Q 10 200 mg qd.   Intolerance Lipitor and Crestor (10 mg) caused muscles aches in the past. Myalgias were mostly in the legs. Patient took Welchol in the past, however LDL didn't improve on this.   Exercise:  Arthritis in her knees does limit her walking some days. She anticipates needed a knee replacement by the end of the year.    Labs: 08/2014- LDL-P 1309, LDL 64, TC 160, TG 206, HDL 55, LFTs normal (Crestor 5mg  daily and Zetia 10mg  daily) 08/2013:  LDL-P 861, LDL 59, TC 136, TG 111, HDL 55, LFTs normal, CMET normal - (Crestor 5 mg qd and Zetia 10 mg qd) 02/2013  LDL-P 926 (goal < 1000), LDL 79 (goal < 100 at least), TC 153, HDL 50, TG 113 (Crestor 5 mg qd + Zetia 10 mg qd) 07/2012   LDL-P 1608, LDL 88, TC 178, HDL 53, TG 186 (Crestor 10 mg qd)  Current Outpatient Prescriptions  Medication Sig Dispense Refill  . aspirin 81 MG tablet Take 81 mg by mouth daily.      . calcium-vitamin D (OSCAL) 250-125 MG-UNIT per tablet Take 1 tablet by mouth daily.      . cholecalciferol (VITAMIN D) 1000 UNITS tablet Take 1,000 Units by mouth daily.    [name] Co-Enzyme Q10  200 MG CAPS Take 200 mg by mouth daily.    [name] ezetimibe (ZETIA) 10 MG tablet Take 1 tablet (10 mg total) by mouth daily. 90 tablet 3  . Multiple Vitamins-Minerals (MULTIVITAMIN PO) Take by mouth 2 (two) times daily. Vemma vitamin    . Omega-3 Fatty Acids (FISH OIL PO) Take 1,000 mg by mouth daily.     . rosuvastatin (CRESTOR) 10 MG tablet 1/2 tablet by mouth daily 45 tablet 3  . tamoxifen (NOLVADEX) 20 MG tablet Take 1 tablet (20 mg total) by mouth daily. (Patient not taking: Reported on 08/20/2014) 90 tablet 3  . telmisartan-hydrochlorothiazide (MICARDIS HCT) 40-12.5 MG per tablet Take 1 tablet by mouth daily. 90 tablet 3   No current facility-administered medications for this visit.   Allergies  Allergen Reactions  . Lipitor [Atorvastatin]     Muscle aches   Family History  Problem Relation Age of Onset  . Cancer Father     lung  . Hypertension Mother   . Hypertension Sister    Assessment and Plan 1.  Hyperlipidemia- Pt's LDL and non-HDL remain at goal.  Her LDL-P and LDL size have changed some but not sure if this is related to all  of the other stressors her body has gone through in the past 6 months with new diagnosis of breast CA.  Given her overall numbers and history of intolerances, will continue current therapy.  Will check labs in 6 months to ensure tamoxifen does not have any effect (only a small chance that it may have metabolic effects).

## 2014-09-16 ENCOUNTER — Other Ambulatory Visit: Payer: Self-pay | Admitting: Cardiology

## 2014-09-17 ENCOUNTER — Other Ambulatory Visit: Payer: Self-pay | Admitting: Radiation Oncology

## 2014-09-17 ENCOUNTER — Ambulatory Visit
Admission: RE | Admit: 2014-09-17 | Discharge: 2014-09-17 | Disposition: A | Discharge status: Home / Self Care | Payer: Medicare Other | Source: Ambulatory Visit | Attending: Radiation Oncology | Admitting: Radiation Oncology

## 2014-09-17 DIAGNOSIS — C50412 Malignant neoplasm of upper-outer quadrant of left female breast: Secondary | ICD-10-CM

## 2014-09-19 ENCOUNTER — Ambulatory Visit
Admission: RE | Admit: 2014-09-19 | Discharge: 2014-09-19 | Disposition: A | Discharge status: Home / Self Care | Payer: Medicare Other | Source: Ambulatory Visit | Attending: Radiation Oncology | Admitting: Radiation Oncology

## 2014-09-19 DIAGNOSIS — C50412 Malignant neoplasm of upper-outer quadrant of left female breast: Secondary | ICD-10-CM | POA: Diagnosis not present

## 2014-09-19 NOTE — Progress Notes (Signed)
  Radiation Oncology         [phone]) [phone] ________________________________  Name: Tammy Cooper MRN: [id number]  Date: 09/19/2014  DOB: 28-Mar-1951  SIMULATION AND TREATMENT PLANNING NOTE  Outpatient  DIAGNOSIS:     ICD-9-CM ICD-10-CM   1. Breast cancer of upper-outer quadrant of left female breast 174.4 C50.412     NARRATIVE:  I discussed her mammogram with Dr Glennon Mac. She is cleared for RT. The patient was brought to the [facility].  Identity was confirmed.  All relevant records and images related to the planned course of therapy were reviewed.  The patient freely provided informed written consent to proceed with treatment after reviewing the details related to the planned course of therapy. The consent form was witnessed and verified by the simulation staff.    Then, the patient was set-up in a stable reproducible  supine position for radiation therapy.  CT images were obtained.  Surface markings were placed.  The CT images were loaded into the planning software.    TREATMENT PLANNING NOTE: Treatment planning then occurred.  The radiation prescription was entered and confirmed.    A total of 3 medically necessary complex treatment devices were fabricated and supervised by me - vaclock, and 2 fields with MLCs to block heart, lungs. I have requested : 3D Simulation  I have requested a DVH of the following structures: heart, lungs, lump cav.    The patient will receive 50 Gy in 25 fractions to the left breast..  Optical Surface Tracking Plan:  Since intensity modulated radiotherapy (IMRT) and 3D conformal radiation treatment methods are predicated on accurate and precise positioning for treatment, intrafraction motion monitoring is medically necessary to ensure accurate and safe treatment delivery. The ability to quantify intrafraction motion without excessive ionizing radiation dose can only be performed with optical surface tracking. Accordingly, surface  imaging offers the opportunity to obtain 3D measurements of patient position throughout IMRT and 3D treatments without excessive radiation exposure. I am ordering optical surface tracking for this patient's upcoming course of radiotherapy.  ________________________________   Reference:  Ursula Alert, J, et al. Surface imaging-based analysis of intrafraction motion for breast radiotherapy patients.Journal of [place], n. 6, nov. 2014. ISSN [id number].  Available at: <http://www.jacmp.org/index.php/jacmp/article/view/4957>.   -----------------------------------  Eppie Gibson, MD

## 2014-09-24 DIAGNOSIS — C50412 Malignant neoplasm of upper-outer quadrant of left female breast: Secondary | ICD-10-CM | POA: Diagnosis not present

## 2014-09-26 ENCOUNTER — Ambulatory Visit
Admission: RE | Admit: 2014-09-26 | Discharge: 2014-09-26 | Disposition: A | Discharge status: Home / Self Care | Payer: Medicare Other | Source: Ambulatory Visit | Attending: Radiation Oncology | Admitting: Radiation Oncology

## 2014-09-26 DIAGNOSIS — C50412 Malignant neoplasm of upper-outer quadrant of left female breast: Secondary | ICD-10-CM | POA: Diagnosis not present

## 2014-09-26 NOTE — Progress Notes (Deleted)
Managing Acute Radiation Side Effects for Head and Neck Cancer  Skin irritation:  1) Biafine  Topical Emulsion: First-line topical cream to help soothe skin irritation.  Apply to skin in radiation fields at least 4 hours before radiotherapy, or any time after treatments during the rest of the day.  2) Triple Antibiotic Ointment (Neosporin): Apply to areas of skin with moist breakdown to prevent infection.  3) 1% hydrocortisone cream: Apply to areas of skin that are itching, up to three times a day.  4) Silvadene (Silver Sulfadiazine): Used in select cases if large patches of skin develop moist breakdown (let physician or nurse know if you have a "sulfa" drug allergy)  Soreness in mouth or throat: 1)Baking Soda Rinse: a home remedy to soothe/cleanse mouth and loosen thick saliva.  Mix 1/2 teaspoon salt, 1/2 teaspoon baking soda, 1 pint water.  Swish, gargle and spit as needed to soothe/cleanse mouth. Use as often as you want.  2) Sucralfate: coats throat to soothe it before meals or any time of day. Crush 1 tablet in 10 mL H20 and swallow up to four times a day.  3) 2% viscous Lidocaine: Soothes mouth and/or throat by numbing your mucous membranes. Mix 1 part 2% viscous lidocaine, 1 part H20. Swish and/or swallow 2mL of this mixture, 18min before meals and at bedtime, up to four times a day. Alternate with Sucralfate.  4) Narcotics: Various short acting and long acting narcotics can be prescribed.  Often, medical oncology will prescribe these if you are receiving chemotherapy concurrently. Narcotics may cause constipation. It may be helpful to take a stool softener (Docusate Sodium) or gentle laxative (ie Senna or Polyethylene Glycol) to prevent constipation.  Having food in your stomach before ingesting a narcotic may reduce risk of stomach upset.  Thick Saliva: 1) 1)Baking Soda Rinse: a home remedy to soothe/cleanse mouth and loosen thick saliva.  Mix 1/2 teaspoon salt, 1/2 teaspoon  baking soda, 1 pint water.  Swish, gargle, and spit as needed to soothe/cleanse mouth. Use as often as you want.  2) Some patients find Diet Ginger Ale or Papaya Juice to be helpful.  3) In extreme cases, your physician may consider prescribing a Scopolamine transdermal patch which dries up your saliva.    Poor taste, or lack of taste:   1) There are no well-established medications to combat taste bud changes from radiotherapy.  It often takes weeks to months to regain taste function.  Eating bland foods and drinking nutritional shakes  may help you maintain your weight when food is not enjoyable.  Some patients supplement their oral intake with a feeding tube.  Fatigue and weakness: 1) There is not a well-established safe and effective medication to combat radiation-induced fatigue.  However, if you are able to perform light exercise (such as a daily walk, yoga, recumbent stationary bicycling), this may combat fatigue and help you maintain muscle mass during treatment.  2) Maintaining hydration and nutrition are also important.  If you have not been referred to a nutritionist and would like a referral, please let your nurse or physician know.  3) Try to get at least 8 hours of sleep each night. You may need a daily nap, but try not to nap so late that it interferes with your nightly sleep schedule.

## 2014-09-29 ENCOUNTER — Ambulatory Visit: Payer: Medicare Other | Admitting: Radiation Oncology

## 2014-09-29 ENCOUNTER — Inpatient Hospital Stay: Admission: RE | Admit: 2014-09-29 | Payer: Self-pay | Source: Ambulatory Visit | Admitting: Radiation Oncology

## 2014-09-29 ENCOUNTER — Ambulatory Visit
Admission: RE | Admit: 2014-09-29 | Discharge: 2014-09-29 | Disposition: A | Discharge status: Home / Self Care | Payer: Medicare Other | Source: Ambulatory Visit | Admitting: Radiation Oncology

## 2014-09-29 ENCOUNTER — Ambulatory Visit
Admission: RE | Admit: 2014-09-29 | Discharge: 2014-09-29 | Disposition: A | Discharge status: Home / Self Care | Payer: Medicare Other | Source: Ambulatory Visit | Attending: Radiation Oncology | Admitting: Radiation Oncology

## 2014-09-29 DIAGNOSIS — C50412 Malignant neoplasm of upper-outer quadrant of left female breast: Secondary | ICD-10-CM

## 2014-09-29 NOTE — Progress Notes (Signed)
Weekly rad txs left breast 1st tx, pt education done, alra, radiaplex gel,  Radiation therapy and you book given, mu business card,discussed ways to manage pain,skin iritatio,fatigue, increas protein in diet, stay hydrated, teach back given nopain 8:25 AM BP 147/62 mmHg  Pulse 62  Temp(Src) 98.2 F (36.8 C) (Oral)  Resp 16  Wt 208 lb 14.4 oz (94.756 kg)  Wt Readings from Last 3 Encounters:  08/18/14 205 lb 3.2 oz (93.078 kg)  07/31/14 204 lb (92.534 kg)  11/12/13 193 lb (87.544 kg)

## 2014-09-29 NOTE — Progress Notes (Signed)
   Weekly Management Note:  Outpatient    ICD-9-CM ICD-10-CM   1. Breast cancer of upper-outer quadrant of left female breast 174.4 C50.412    DCIS left breast  Current Dose:  2 Gy  Projected Dose: 50 Gy   Narrative:  The patient presents for routine under treatment assessment.  CBCT/MVCT images/Port film x-rays were reviewed.  The chart was checked. She reports she has been tearful since diagnosis and needs a xanax refill.  Physical Findings:  vitals were not taken for this visit.  Wt Readings from Last 3 Encounters:  08/18/14 205 lb 3.2 oz (93.078 kg)  07/31/14 204 lb (92.534 kg)  11/12/13 193 lb (87.544 kg)   Tearful, comforted by husband  Impression:  The patient is tolerating radiotherapy.  Plan:  Continue radiotherapy as planned. Offered social work referral for counseling - she declines at this time.  Suggested she followup with the MD that originally Rx her xanax; she understands this can be habit forming and I suggested counseling as part of her treatment for anxiety.  ________________________________   Eppie Gibson, M.D.

## 2014-09-29 NOTE — Addendum Note (Signed)
Encounter addended by: Doreen Beam, RN on: 09/29/2014 10:25 AM<BR>     Documentation filed: Inpatient Patient Education

## 2014-09-30 ENCOUNTER — Ambulatory Visit
Admission: RE | Admit: 2014-09-30 | Discharge: 2014-09-30 | Disposition: A | Discharge status: Home / Self Care | Payer: Medicare Other | Source: Ambulatory Visit | Attending: Radiation Oncology | Admitting: Radiation Oncology

## 2014-09-30 DIAGNOSIS — C50412 Malignant neoplasm of upper-outer quadrant of left female breast: Secondary | ICD-10-CM | POA: Diagnosis not present

## 2014-10-01 ENCOUNTER — Ambulatory Visit
Admission: RE | Admit: 2014-10-01 | Discharge: 2014-10-01 | Disposition: A | Discharge status: Home / Self Care | Payer: Medicare Other | Source: Ambulatory Visit | Attending: Radiation Oncology | Admitting: Radiation Oncology

## 2014-10-01 DIAGNOSIS — C50412 Malignant neoplasm of upper-outer quadrant of left female breast: Secondary | ICD-10-CM | POA: Diagnosis not present

## 2014-10-02 ENCOUNTER — Ambulatory Visit
Admission: RE | Admit: 2014-10-02 | Discharge: 2014-10-02 | Disposition: A | Discharge status: Home / Self Care | Payer: Medicare Other | Source: Ambulatory Visit | Attending: Radiation Oncology | Admitting: Radiation Oncology

## 2014-10-02 DIAGNOSIS — C50412 Malignant neoplasm of upper-outer quadrant of left female breast: Secondary | ICD-10-CM | POA: Diagnosis not present

## 2014-10-03 ENCOUNTER — Ambulatory Visit
Admission: RE | Admit: 2014-10-03 | Discharge: 2014-10-03 | Disposition: A | Discharge status: Home / Self Care | Payer: Medicare Other | Source: Ambulatory Visit | Attending: Radiation Oncology | Admitting: Radiation Oncology

## 2014-10-03 DIAGNOSIS — C50412 Malignant neoplasm of upper-outer quadrant of left female breast: Secondary | ICD-10-CM | POA: Diagnosis not present

## 2014-10-06 ENCOUNTER — Encounter: Payer: Self-pay | Admitting: Radiation Oncology

## 2014-10-06 ENCOUNTER — Ambulatory Visit
Admission: RE | Admit: 2014-10-06 | Discharge: 2014-10-06 | Disposition: A | Discharge status: Home / Self Care | Payer: Medicare Other | Source: Ambulatory Visit | Attending: Radiation Oncology | Admitting: Radiation Oncology

## 2014-10-06 VITALS — BP 115/61 | HR 69 | Temp 98.0°F | Resp 16 | Wt 208.1 lb

## 2014-10-06 DIAGNOSIS — C50412 Malignant neoplasm of upper-outer quadrant of left female breast: Secondary | ICD-10-CM | POA: Diagnosis not present

## 2014-10-06 NOTE — Progress Notes (Signed)
Weight and vitals stable. Denies pain. Denies skin changes within treatment field. Reports using radiaplex bid as directed. Denies fatigue. Patient questioned if she could swim while receiving radiation therapy. Discouraged her from swimming once skin breakdown begins. Also, advised patient to cover skin with a UV resistant shirt while swimming. Instructed patient to shower and moisturize with Radiaplex immediately after getting out of the pool. Discouraged patient from swimming in any lakes or the ocean at all. Discussed FYNN. Listened to the patient and attempted to normalize her feelings.   BP 115/61 mmHg  Pulse 69  Temp(Src) 98 F (36.7 C) (Oral)  Resp 16  Wt 208 lb 1.6 oz (94.394 kg)  SpO2 100% Wt Readings from Last 3 Encounters:  10/06/14 208 lb 1.6 oz (94.394 kg)  09/29/14 208 lb 14.4 oz (94.756 kg)  08/20/14 205 lb 8 oz (93.214 kg)

## 2014-10-06 NOTE — Progress Notes (Signed)
   Weekly Management Note:  Outpatient    ICD-9-CM ICD-10-CM   1. Breast cancer of upper-outer quadrant of left female breast 174.4 C50.412    DCIS left breast  Current Dose:  12 Gy  Projected Dose: 50 Gy   Narrative:  The patient presents for routine under treatment assessment.  CBCT/MVCT images/Port film x-rays were reviewed.  The chart was checked. She has experienced no pain or skin changes thus far.   Physical Findings:  weight is 208 lb 1.6 oz (94.394 kg). Her oral temperature is 98 F (36.7 C). Her blood pressure is 115/61 and her pulse is 69. Her respiration is 16 and oxygen saturation is 100%.   Wt Readings from Last 3 Encounters:  10/06/14 208 lb 1.6 oz (94.394 kg)  09/29/14 208 lb 14.4 oz (94.756 kg)  08/20/14 205 lb 8 oz (93.214 kg)   No skin changes thus far.  Impression:  The patient is tolerating radiotherapy.   Plan:  Continue radiotherapy as planned.  Discussed precautions to take while swimming and being outside this summer.   This document serves as a record of services personally performed by Eppie Gibson, MD. It was created on her behalf by Arlyce Harman, a trained medical scribe. The creation of this record is based on the scribe's personal observations and the provider's statements to them. This document has been checked and approved by the attending provider. ________________________________   Eppie Gibson, M.D.

## 2014-10-07 ENCOUNTER — Ambulatory Visit
Admission: RE | Admit: 2014-10-07 | Discharge: 2014-10-07 | Disposition: A | Discharge status: Home / Self Care | Payer: Medicare Other | Source: Ambulatory Visit | Attending: Radiation Oncology | Admitting: Radiation Oncology

## 2014-10-07 DIAGNOSIS — C50412 Malignant neoplasm of upper-outer quadrant of left female breast: Secondary | ICD-10-CM | POA: Diagnosis not present

## 2014-10-08 ENCOUNTER — Ambulatory Visit
Admission: RE | Admit: 2014-10-08 | Discharge: 2014-10-08 | Disposition: A | Discharge status: Home / Self Care | Payer: Medicare Other | Source: Ambulatory Visit | Attending: Radiation Oncology | Admitting: Radiation Oncology

## 2014-10-08 DIAGNOSIS — C50412 Malignant neoplasm of upper-outer quadrant of left female breast: Secondary | ICD-10-CM | POA: Diagnosis not present

## 2014-10-09 ENCOUNTER — Ambulatory Visit
Admission: RE | Admit: 2014-10-09 | Discharge: 2014-10-09 | Disposition: A | Discharge status: Home / Self Care | Payer: Medicare Other | Source: Ambulatory Visit | Attending: Radiation Oncology | Admitting: Radiation Oncology

## 2014-10-09 DIAGNOSIS — C50412 Malignant neoplasm of upper-outer quadrant of left female breast: Secondary | ICD-10-CM | POA: Diagnosis not present

## 2014-10-10 ENCOUNTER — Ambulatory Visit
Admission: RE | Admit: 2014-10-10 | Discharge: 2014-10-10 | Disposition: A | Discharge status: Home / Self Care | Payer: Medicare Other | Source: Ambulatory Visit | Attending: Radiation Oncology | Admitting: Radiation Oncology

## 2014-10-10 ENCOUNTER — Other Ambulatory Visit: Payer: Self-pay | Admitting: *Deleted

## 2014-10-10 DIAGNOSIS — C50412 Malignant neoplasm of upper-outer quadrant of left female breast: Secondary | ICD-10-CM | POA: Diagnosis not present

## 2014-10-10 MED ORDER — TELMISARTAN-HCTZ 40-12.5 MG PO TABS: 1.0000 | ORAL_TABLET | Freq: Every day | ORAL | Status: DC

## 2014-10-13 ENCOUNTER — Encounter: Payer: Self-pay | Admitting: Radiation Oncology

## 2014-10-13 ENCOUNTER — Ambulatory Visit
Admission: RE | Admit: 2014-10-13 | Discharge: 2014-10-13 | Disposition: A | Discharge status: Home / Self Care | Payer: Medicare Other | Source: Ambulatory Visit | Attending: Radiation Oncology | Admitting: Radiation Oncology

## 2014-10-13 VITALS — BP 115/58 | HR 70 | Temp 97.9°F | Resp 12 | Wt 207.7 lb

## 2014-10-13 DIAGNOSIS — C50412 Malignant neoplasm of upper-outer quadrant of left female breast: Secondary | ICD-10-CM | POA: Diagnosis not present

## 2014-10-13 MED ORDER — RADIAPLEXRX EX GEL
Freq: Once | CUTANEOUS | Status: AC
  Administered 2014-10-13: 18:00:00 via TOPICAL

## 2014-10-13 NOTE — Addendum Note (Signed)
Encounter addended by: Jenene Slicker, RN on: 10/13/2014  5:39 PM<BR>     Documentation filed: [employer], Inpatient MAR, Orders

## 2014-10-13 NOTE — Progress Notes (Signed)
PAIN: She is currently in no pain. SKIN: Pt left breast- warm dry and intact.  Pt continues to apply Radiaplex as directed. OTHER: Pt complains of fatigue. BP 115/58 mmHg  Pulse 70  Temp(Src) 97.9 F (36.6 C) (Oral)  Resp 12  Wt 207 lb 11.2 oz (94.212 kg)  SpO2 95%

## 2014-10-13 NOTE — Progress Notes (Signed)
Weekly Management Note:  Site: Left breast  Current Dose:  2200  cGy Projected Dose: 5000  cGy  Narrative: The patient is seen today for routine under treatment assessment. CBCT/MVCT images/port films were reviewed. The chart was reviewed.   She is without complaints today.  She uses Radioplex gel.  Physical Examination:  Filed Vitals:   10/13/14 0836  BP: 115/58  Pulse: 70  Temp: 97.9 F (36.6 C)  Resp: 12  .  Weight: 207 lb 11.2 oz (94.212 kg).  There is mild erythema along the left breast with no areas of desquamation.  Impression: Tolerating radiation therapy well.  Plan: Continue radiation therapy as planned.

## 2014-10-14 ENCOUNTER — Ambulatory Visit
Admission: RE | Admit: 2014-10-14 | Discharge: 2014-10-14 | Disposition: A | Discharge status: Home / Self Care | Payer: Medicare Other | Source: Ambulatory Visit | Attending: Radiation Oncology | Admitting: Radiation Oncology

## 2014-10-14 DIAGNOSIS — C50412 Malignant neoplasm of upper-outer quadrant of left female breast: Secondary | ICD-10-CM | POA: Diagnosis not present

## 2014-10-15 ENCOUNTER — Ambulatory Visit
Admission: RE | Admit: 2014-10-15 | Discharge: 2014-10-15 | Disposition: A | Discharge status: Home / Self Care | Payer: Medicare Other | Source: Ambulatory Visit | Attending: Radiation Oncology | Admitting: Radiation Oncology

## 2014-10-15 DIAGNOSIS — C50412 Malignant neoplasm of upper-outer quadrant of left female breast: Secondary | ICD-10-CM | POA: Diagnosis not present

## 2014-10-16 ENCOUNTER — Ambulatory Visit
Admission: RE | Admit: 2014-10-16 | Discharge: 2014-10-16 | Disposition: A | Discharge status: Home / Self Care | Payer: Medicare Other | Source: Ambulatory Visit | Attending: Radiation Oncology | Admitting: Radiation Oncology

## 2014-10-16 DIAGNOSIS — C50412 Malignant neoplasm of upper-outer quadrant of left female breast: Secondary | ICD-10-CM | POA: Diagnosis not present

## 2014-10-17 ENCOUNTER — Ambulatory Visit
Admission: RE | Admit: 2014-10-17 | Discharge: 2014-10-17 | Disposition: A | Discharge status: Home / Self Care | Payer: Medicare Other | Source: Ambulatory Visit | Attending: Radiation Oncology | Admitting: Radiation Oncology

## 2014-10-17 DIAGNOSIS — C50412 Malignant neoplasm of upper-outer quadrant of left female breast: Secondary | ICD-10-CM | POA: Diagnosis not present

## 2014-10-20 ENCOUNTER — Ambulatory Visit
Admission: RE | Admit: 2014-10-20 | Discharge: 2014-10-20 | Disposition: A | Discharge status: Home / Self Care | Payer: Medicare Other | Source: Ambulatory Visit | Attending: Radiation Oncology | Admitting: Radiation Oncology

## 2014-10-20 ENCOUNTER — Encounter: Payer: Self-pay | Admitting: Radiation Oncology

## 2014-10-20 ENCOUNTER — Other Ambulatory Visit: Payer: Self-pay

## 2014-10-20 VITALS — BP 114/80 | HR 69 | Resp 16 | Wt 209.4 lb

## 2014-10-20 DIAGNOSIS — C50412 Malignant neoplasm of upper-outer quadrant of left female breast: Secondary | ICD-10-CM | POA: Diagnosis not present

## 2014-10-20 DIAGNOSIS — Z510 Encounter for antineoplastic radiation therapy: Secondary | ICD-10-CM | POA: Diagnosis not present

## 2014-10-20 DIAGNOSIS — L599 Disorder of the skin and subcutaneous tissue related to radiation, unspecified: Secondary | ICD-10-CM | POA: Insufficient documentation

## 2014-10-20 MED ORDER — RADIAPLEXRX EX GEL
Freq: Once | CUTANEOUS | Status: AC
Start: 2014-10-20 — End: 2014-10-20
  Administered 2014-10-20: 10:00:00 via TOPICAL

## 2014-10-20 NOTE — Progress Notes (Signed)
   Weekly Management Note:  Outpatient    ICD-9-CM ICD-10-CM   1. Breast cancer of upper-outer quadrant of left female breast 174.4 C50.412 hyaluronate sodium (RADIAPLEXRX) gel   DCIS left breast  Current Dose:  32 Gy  Projected Dose: 50 Gy   Narrative:  The patient presents for routine under treatment assessment.  CBCT/MVCT images/Port film x-rays were reviewed.  The chart was checked.  Mild skin changes. Inquiring about finishing on July 8th, if possible.  Physical Findings:  weight is 209 lb 6.4 oz (94.983 kg). Her blood pressure is 114/80 and her pulse is 69. Her respiration is 16.   Wt Readings from Last 3 Encounters:  10/20/14 209 lb 6.4 oz (94.983 kg)  10/13/14 207 lb 11.2 oz (94.212 kg)  10/06/14 208 lb 1.6 oz (94.394 kg)   Erythema of left breast, skin intact  Impression:  The patient is tolerating radiotherapy.   Plan:  Continue radiotherapy as planned.  Okay to give BID tx on one day during last week to allow finishing before weekend. ________________________________   Eppie Gibson, M.D.

## 2014-10-20 NOTE — Progress Notes (Signed)
Patient does not wish to present for 7/11 final treatment. Dr. Isidore Moos offered for the patient to be treated bid the week prior. Spoke with Trudee Kuster, RT on L3 reference scheduling. Anderson Malta, RT confirmed she would contact the patient via her cell phone to arrange this bid appointment.

## 2014-10-20 NOTE — Progress Notes (Signed)
Weight and vitals stable. Reports hot flashes. Denies pain. Reports hyperpigmentation within treatment field but, denies desquamation. Reports using radiaplex bid as directed. Reports mild manageable fatigue. Questions if she can complete on Friday, July 8th and forego final treatment planned for Monday, July 11th.   BP 114/80 mmHg  Pulse 69  Resp 16  Wt 209 lb 6.4 oz (94.983 kg) Wt Readings from Last 3 Encounters:  10/20/14 209 lb 6.4 oz (94.983 kg)  10/13/14 207 lb 11.2 oz (94.212 kg)  10/06/14 208 lb 1.6 oz (94.394 kg)

## 2014-10-21 ENCOUNTER — Ambulatory Visit
Admission: RE | Admit: 2014-10-21 | Discharge: 2014-10-21 | Disposition: A | Discharge status: Home / Self Care | Payer: Medicare Other | Source: Ambulatory Visit | Attending: Radiation Oncology | Admitting: Radiation Oncology

## 2014-10-21 DIAGNOSIS — C50412 Malignant neoplasm of upper-outer quadrant of left female breast: Secondary | ICD-10-CM | POA: Diagnosis not present

## 2014-10-22 ENCOUNTER — Ambulatory Visit
Admission: RE | Admit: 2014-10-22 | Discharge: 2014-10-22 | Disposition: A | Discharge status: Home / Self Care | Payer: Medicare Other | Source: Ambulatory Visit | Attending: Radiation Oncology | Admitting: Radiation Oncology

## 2014-10-22 DIAGNOSIS — C50412 Malignant neoplasm of upper-outer quadrant of left female breast: Secondary | ICD-10-CM | POA: Diagnosis not present

## 2014-10-23 ENCOUNTER — Ambulatory Visit
Admission: RE | Admit: 2014-10-23 | Discharge: 2014-10-23 | Disposition: A | Discharge status: Home / Self Care | Payer: Medicare Other | Source: Ambulatory Visit | Attending: Radiation Oncology | Admitting: Radiation Oncology

## 2014-10-23 DIAGNOSIS — C50412 Malignant neoplasm of upper-outer quadrant of left female breast: Secondary | ICD-10-CM | POA: Diagnosis not present

## 2014-10-24 ENCOUNTER — Ambulatory Visit
Admission: RE | Admit: 2014-10-24 | Discharge: 2014-10-24 | Disposition: A | Discharge status: Home / Self Care | Payer: Medicare Other | Source: Ambulatory Visit | Attending: Radiation Oncology | Admitting: Radiation Oncology

## 2014-10-24 DIAGNOSIS — C50412 Malignant neoplasm of upper-outer quadrant of left female breast: Secondary | ICD-10-CM | POA: Diagnosis not present

## 2014-10-28 ENCOUNTER — Ambulatory Visit
Admission: RE | Admit: 2014-10-28 | Discharge: 2014-10-28 | Disposition: A | Discharge status: Home / Self Care | Payer: Medicare Other | Source: Ambulatory Visit | Attending: Radiation Oncology | Admitting: Radiation Oncology

## 2014-10-28 ENCOUNTER — Encounter: Payer: Self-pay | Admitting: Radiation Oncology

## 2014-10-28 VITALS — BP 130/59 | HR 66 | Temp 98.0°F | Resp 20

## 2014-10-28 DIAGNOSIS — C50412 Malignant neoplasm of upper-outer quadrant of left female breast: Secondary | ICD-10-CM | POA: Diagnosis not present

## 2014-10-28 NOTE — Progress Notes (Signed)
Weekly rad txs left breast 21/25.k, dermatitis,iches, using radiaplex gel bid, appetite, energy level okay stated 8:32 AM BP 130/59 mmHg  Pulse 66  Temp(Src) 98 F (36.7 C) (Oral)  Resp 20  Wt Readings from Last 3 Encounters:  10/20/14 209 lb 6.4 oz (94.983 kg)  10/13/14 207 lb 11.2 oz (94.212 kg)  10/06/14 208 lb 1.6 oz (94.394 kg)

## 2014-10-28 NOTE — Progress Notes (Signed)
Weekly Management Note:  Site: Left breast  Current Dose:  4200  cGy Projected Dose: 5000  cGy  Narrative: The patient is seen today for routine under treatment assessment. CBCT/MVCT images/port films were reviewed. The chart was reviewed.   She is without complaints today.  She uses Radioplex gel.  Physical Examination:  Filed Vitals:   10/28/14 0831  BP: 130/59  Pulse: 66  Temp: 98 F (36.7 C)  Resp: 20  .  Weight:  .  There is diffuse erythema along the left breast which is more papular along the upper inner quadrant (sun exposed skin).  There is patchy dry desquamation with no areas of moist desquamation.  Impression: Tolerating radiation therapy well.  She will be treated twice a day tomorrow and finish this Friday.  Plan: Continue radiation therapy as planned.  One-month follow-up with Dr. Isidore Moos on completion of radiation therapy.

## 2014-10-29 ENCOUNTER — Ambulatory Visit
Admission: RE | Admit: 2014-10-29 | Discharge: 2014-10-29 | Disposition: A | Discharge status: Home / Self Care | Payer: Medicare Other | Source: Ambulatory Visit | Attending: Radiation Oncology | Admitting: Radiation Oncology

## 2014-10-29 DIAGNOSIS — C50412 Malignant neoplasm of upper-outer quadrant of left female breast: Secondary | ICD-10-CM | POA: Diagnosis not present

## 2014-10-30 ENCOUNTER — Ambulatory Visit
Admission: RE | Admit: 2014-10-30 | Discharge: 2014-10-30 | Disposition: A | Discharge status: Home / Self Care | Payer: Medicare Other | Source: Ambulatory Visit | Attending: Radiation Oncology | Admitting: Radiation Oncology

## 2014-10-30 DIAGNOSIS — C50412 Malignant neoplasm of upper-outer quadrant of left female breast: Secondary | ICD-10-CM | POA: Diagnosis not present

## 2014-10-31 ENCOUNTER — Ambulatory Visit
Admission: RE | Admit: 2014-10-31 | Discharge: 2014-10-31 | Disposition: A | Discharge status: Home / Self Care | Payer: Medicare Other | Source: Ambulatory Visit | Attending: Radiation Oncology | Admitting: Radiation Oncology

## 2014-10-31 ENCOUNTER — Ambulatory Visit: Payer: Medicare Other

## 2014-10-31 ENCOUNTER — Encounter: Payer: Self-pay | Admitting: Radiation Oncology

## 2014-10-31 DIAGNOSIS — C50412 Malignant neoplasm of upper-outer quadrant of left female breast: Secondary | ICD-10-CM | POA: Diagnosis not present

## 2014-11-03 ENCOUNTER — Ambulatory Visit: Payer: Medicare Other

## 2014-11-11 NOTE — Progress Notes (Signed)
  Radiation Oncology         [phone] ________________________________  Name: Tammy Cooper MRN: [id number]  Date: 10/31/2014  DOB: 06/01/1950  End of Treatment Note  Left breast Stage 0, DCIS, ER PR positive, high-grade  Indication for treatment:  Curative       Radiation treatment dates:   09/29/2014-10/31/2014  Site/dose:   1) Left breast / 50 Gy in 25 fractions   Beams/energy:   1) 3D conformal Tangents / 10 and 6MV photons   Narrative: The patient tolerated radiation treatment relatively well.      Plan: The patient has completed radiation treatment. The patient will return to radiation oncology clinic for routine followup in one month. I advised them to call or return sooner if they have any questions or concerns related to their recovery or treatment.  -----------------------------------  Eppie Gibson, MD

## 2014-11-18 ENCOUNTER — Ambulatory Visit (HOSPITAL_BASED_OUTPATIENT_CLINIC_OR_DEPARTMENT_OTHER): Payer: Medicare Other | Admitting: Hematology and Oncology

## 2014-11-18 ENCOUNTER — Encounter: Payer: Self-pay | Admitting: Hematology and Oncology

## 2014-11-18 VITALS — BP 132/48 | HR 79 | Temp 98.6°F | Resp 18 | Ht 63.0 in | Wt 209.1 lb

## 2014-11-18 DIAGNOSIS — C50412 Malignant neoplasm of upper-outer quadrant of left female breast: Secondary | ICD-10-CM | POA: Diagnosis not present

## 2014-11-18 DIAGNOSIS — F329 Major depressive disorder, single episode, unspecified: Secondary | ICD-10-CM | POA: Diagnosis not present

## 2014-11-18 MED ORDER — VENLAFAXINE HCL ER 37.5 MG PO CP24: 37.5000 mg | ORAL_CAPSULE | Freq: Every day | ORAL | Status: DC

## 2014-11-18 MED ORDER — ANASTROZOLE 1 MG PO TABS: 1.0000 mg | ORAL_TABLET | Freq: Every day | ORAL | Status: DC

## 2014-11-18 NOTE — Progress Notes (Signed)
Patient Care Team: Donald Prose, MD as PCP - General (Family Medicine) Sueanne Margarita, MD as Consulting Physician (Cardiology)  DIAGNOSIS: No matching staging information was found for the patient.  SUMMARY OF ONCOLOGIC HISTORY:   Breast cancer of upper-outer quadrant of left female breast   05/22/2014 Breast MRI Left breast 12:00 position irregular nodular enhancement on 0.6 x 1.2 x 0.8 cm, no abnormal lymph nodes   07/31/2014 Surgery Left breast lumpectomy: DCIS with calcifications 1 cm size ER 99%, PR 99%   09/29/2014 - 10/31/2014 Radiation Therapy Adjuvant radiation   11/18/2014 -  Anti-estrogen oral therapy anastrozole 1 mg daily 5 years    CHIEF COMPLIANT: follow-up after radiation therapy  INTERVAL HISTORY: Tammy Cooper is a 64 year old above-mentioned history of left breast cancer treated with lumpectomy and radiation. She was started on oral tamoxifen and was tolerating it fairly well. She is complaining of major depression with tearfulness multiple times throughout the day. She does not have good control of her emotional state. She is otherwise a very happy person. She cannot explain her mood swings and emotional problems. She tells me that she had a prior history of TIA in 2006. Her major symptoms are hot flashes that have been profound along with depression.  REVIEW OF SYSTEMS:   Constitutional: Denies fevers, chills or abnormal weight loss Eyes: Denies blurriness of vision Ears, nose, mouth, throat, and face: Denies mucositis or sore throat Respiratory: Denies cough, dyspnea or wheezes Cardiovascular: Denies palpitation, chest discomfort or lower extremity swelling Gastrointestinal:  Denies nausea, heartburn or change in bowel habits Skin: Denies abnormal skin rashes Lymphatics: Denies new lymphadenopathy or easy bruising Neurological:Denies numbness, tingling or new weaknesses Behavioral/Psych: depression  All other systems were reviewed with the patient and are  negative.  I have reviewed the past medical history, past surgical history, social history and family history with the patient and they are unchanged from previous note.  ALLERGIES:  is allergic to lipitor.  MEDICATIONS:  Current Outpatient Prescriptions  Medication Sig Dispense Refill  . ALPRAZolam (XANAX) 0.5 MG tablet Take 0.5 mg by mouth daily.    [name] aspirin 81 MG tablet Take 81 mg by mouth daily.      . calcium-vitamin D (OSCAL) 250-125 MG-UNIT per tablet Take 1 tablet by mouth daily.      . cholecalciferol (VITAMIN D) 1000 UNITS tablet Take 1,000 Units by mouth daily.    [name] Co-Enzyme Q10 200 MG CAPS Take 200 mg by mouth daily.    [name] ezetimibe (ZETIA) 10 MG tablet Take 1 tablet (10 mg total) by mouth daily. 90 tablet 3  . hyaluronate sodium (RADIAPLEXRX) GEL Apply 1 application topically once.    . Multiple Vitamins-Minerals (MULTIVITAMIN PO) Take by mouth 2 (two) times daily. Vemma vitamin    . non-metallic deodorant (ALRA) MISC Apply 1 application topically daily as needed.    . Omega-3 Fatty Acids (FISH OIL PO) Take 1,000 mg by mouth daily.     . rosuvastatin (CRESTOR) 10 MG tablet 1/2 tablet by mouth daily 45 tablet 3  . telmisartan-hydrochlorothiazide (MICARDIS HCT) 40-12.5 MG per tablet Take 1 tablet by mouth daily. 90 tablet 1  . anastrozole (ARIMIDEX) 1 MG tablet Take 1 tablet (1 mg total) by mouth daily. 90 tablet 3  . venlafaxine XR (EFFEXOR-XR) 37.5 MG 24 hr capsule Take 1 capsule (37.5 mg total) by mouth daily with breakfast. 30 capsule 6   No current facility-administered medications for this visit.    PHYSICAL EXAMINATION:  ECOG PERFORMANCE STATUS: 1 - Symptomatic but completely ambulatory  Filed Vitals:   11/18/14 1339  BP: 132/48  Pulse: 79  Temp: 98.6 F (37 C)  Resp: 18   Filed Weights   11/18/14 1339  Weight: 209 lb 1 oz (94.83 kg)    GENERAL:alert, no distress and comfortable SKIN: skin color, texture, turgor are normal, no rashes or significant  lesions EYES: normal, Conjunctiva are pink and non-injected, sclera clear OROPHARYNX:no exudate, no erythema and lips, buccal mucosa, and tongue normal  NECK: supple, thyroid normal size, non-tender, without nodularity LYMPH:  no palpable lymphadenopathy in the cervical, axillary or inguinal LUNGS: clear to auscultation and percussion with normal breathing effort HEART: regular rate & rhythm and no murmurs and no lower extremity edema ABDOMEN:abdomen soft, non-tender and normal bowel sounds Musculoskeletal:no cyanosis of digits and no clubbing  NEURO: alert & oriented x 3 with fluent speech, no focal motor/sensory deficits  LABORATORY DATA:  I have reviewed the data as listed   Chemistry      Component Value Date/Time   NA 139 07/29/2014 1453   K 4.0 07/29/2014 1453   CL 102 07/29/2014 1453   CO2 28 07/29/2014 1453   BUN 10 07/29/2014 1453   CREATININE 0.71 07/29/2014 1453      Component Value Date/Time   CALCIUM 9.7 07/29/2014 1453   ALKPHOS 85 09/02/2014 0730   AST 20 09/02/2014 0730   ALT 24 09/02/2014 0730   BILITOT 0.4 09/02/2014 0730       Lab Results  Component Value Date   WBC 6.6 10/19/2010   HGB 12.7 10/19/2010   HCT 38.1 10/19/2010   MCV 85.8 10/19/2010   PLT 303 10/19/2010   NEUTROABS 3.8 12/07/2006   ASSESSMENT & PLAN:  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 Prognosis: Based on [facility] prognostic nomogram, with antiestrogen therapy and radiation therapy her 5 year risk of recurrence of 1% in 10 year risk is a 2%. Adjuvant radiation completed 10/31/2014 Current treatment: Adjuvant anastrozole 1 mg daily ( chosen because of history of TIA)  Major depression: Along with severe hot flashes. I recommended starting Effexor XR 37.5 mg once daily. This would help both depression and hot flashes.  Return to clinic in 3 months for follow-up.  No  orders of the defined types were placed in this encounter.   The patient has a good understanding of the overall plan. she agrees with it. she will call with any problems that may develop before the next visit here.   Rulon Eisenmenger, MD

## 2014-11-18 NOTE — Assessment & Plan Note (Signed)
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 Prognosis: Based on [facility] prognostic nomogram, with antiestrogen therapy and radiation therapy her 5 year risk of recurrence of 1% in 10 year risk is a 2%. Current treatment: Adjuvant radiation therapy  Plan treatment: After radiation is complete, adjuvant tamoxifen 20 mg daily 5 years I provided her with a prescription for tamoxifen which we will start 2 weeks after completion of radiation therapy. I will see her back one month after starting tamoxifen to assess toxicities to treatment.

## 2014-11-19 ENCOUNTER — Encounter: Payer: Self-pay | Admitting: Cardiology

## 2014-11-19 ENCOUNTER — Ambulatory Visit (INDEPENDENT_AMBULATORY_CARE_PROVIDER_SITE_OTHER): Payer: Medicare Other | Admitting: Cardiology

## 2014-11-19 VITALS — BP 117/58 | HR 75 | Ht 63.0 in | Wt 207.0 lb

## 2014-11-19 DIAGNOSIS — E785 Hyperlipidemia, unspecified: Secondary | ICD-10-CM | POA: Diagnosis not present

## 2014-11-19 DIAGNOSIS — I10 Essential (primary) hypertension: Secondary | ICD-10-CM

## 2014-11-19 DIAGNOSIS — E669 Obesity, unspecified: Secondary | ICD-10-CM

## 2014-11-19 NOTE — Patient Instructions (Signed)

## 2014-11-19 NOTE — Progress Notes (Signed)
Cardiology Office Note   Date:  11/19/2014   ID:  Tammy Cooper, DOB 01/08/1951, MRN [id number]  PCP:  Lynne Logan, MD    Chief Complaint  Patient presents with  . Follow-up    eseential hypertension      History of Present Illness: Tammy Cooper is a 64 y.o. female with a history of HTN and dyslipidemia who presents today for followup. She is doing well. She denies any chest pain, SOB, DOE, LE edema, dizziness, palpitations or syncope.     Past Medical History  Diagnosis Date  . Hypertension   . Hypercholesteremia   . Stenosing tenosynovitis     Of multiple digits- Dr Grandville Silos  . Arthritis   . Anxiety   . Breast cancer 05/28/14    Left breast in situ   . Stroke     H/O TIA in 2006, MRA clear,arteriogram was done which was normal    Past Surgical History  Procedure Laterality Date  . Tonsillectomy  1958  . Rotator cuff repair  2005    rt shoulder  . Arthrogram knee  2010    lt knee  . Back surgery  07/12/2010    stenosis L4 + L5  . Anal skin tag removal  10/21/10  . Vaginal hysterectomy  2003    total hyst-BSO-Ant/post repair  . Colonoscopy    . Hemorroidectomy    . Breast lumpectomy with radioactive seed localization Left 07/31/2014    Procedure: LEFT BREAST LUMPECTOMY WITH RADIOACTIVE SEED LOCALIZATION;  Surgeon: Coralie Keens, MD;  Location: [place];  Service: General;  Laterality: Left;     Current Outpatient Prescriptions  Medication Sig Dispense Refill  . anastrozole (ARIMIDEX) 1 MG tablet Take 1 tablet (1 mg total) by mouth daily. 90 tablet 3  . aspirin 81 MG tablet Take 81 mg by mouth daily.      . calcium-vitamin D (OSCAL) 250-125 MG-UNIT per tablet Take 1 tablet by mouth daily.      . cholecalciferol (VITAMIN D) 1000 UNITS tablet Take 1,000 Units by mouth daily.    [name] Co-Enzyme Q10 200 MG CAPS Take 200 mg by mouth daily.    [name] ezetimibe (ZETIA) 10 MG tablet Take 1 tablet (10 mg total) by mouth  daily. 90 tablet 3  . Multiple Vitamins-Minerals (MULTIVITAMIN PO) Take by mouth 2 (two) times daily. Vemma vitamin    . non-metallic deodorant (ALRA) MISC Apply 1 application topically daily as needed.    . rosuvastatin (CRESTOR) 10 MG tablet 1/2 tablet by mouth daily 45 tablet 3  . telmisartan-hydrochlorothiazide (MICARDIS HCT) 40-12.5 MG per tablet Take 1 tablet by mouth daily. 90 tablet 1  . venlafaxine XR (EFFEXOR-XR) 37.5 MG 24 hr capsule Take 1 capsule (37.5 mg total) by mouth daily with breakfast. 30 capsule 6   No current facility-administered medications for this visit.    Allergies:   Lipitor    Social History:  The patient  reports that she has never smoked. She has never used smokeless tobacco. She reports that she drinks alcohol. She reports that she does not use illicit drugs.   Family History:  The patient's family history includes Cancer in her father; Hypertension in her mother and sister. There is no history of Heart attack.    ROS:  Please see the history of present illness.   Otherwise, review of systems are positive for none.  All other systems are reviewed and negative.    PHYSICAL EXAM: VS:  BP 117/58 mmHg  Pulse 75  Ht 5\' 3"  (1.6 m)  Wt 207 lb (93.895 kg)  BMI 36.68 kg/m2  SpO2 91% , BMI Body mass index is 36.68 kg/(m^2). GEN: Well nourished, well developed, in no acute distress HEENT: normal Neck: no JVD, carotid bruits, or masses Cardiac: RRR; no murmurs, rubs, or gallops,no edema  Respiratory:  clear to auscultation bilaterally, normal work of breathing GI: soft, nontender, nondistended, + BS MS: no deformity or atrophy Skin: warm and dry, no rash Neuro:  Strength and sensation are intact Psych: euthymic mood, full affect   EKG:  EKG was not ordered today.    Recent Labs: 07/29/2014: BUN 10; Creatinine, Ser 0.71; Potassium 4.0; Sodium 139 09/02/2014: ALT 24    Lipid Panel    Component Value Date/Time   CHOL 160 09/02/2014 0730   CHOL 173  05/29/2006 1013   TRIG 206* 09/02/2014 0730   TRIG 121 05/29/2006 1013   HDL 55 09/02/2014 0730   HDL 55.7 05/29/2006 1013   CHOLHDL 3.1 CALC 05/29/2006 1013   VLDL 24 05/29/2006 1013   LDLCALC 64 09/02/2014 0730   LDLCALC 93 05/29/2006 1013      Wt Readings from Last 3 Encounters:  11/19/14 207 lb (93.895 kg)  11/18/14 209 lb 1 oz (94.83 kg)  10/20/14 209 lb 6.4 oz (94.983 kg)     ASSESSMENT AND PLAN:  1. Dyslipidemia - lipids at goal - continue Zetia/Crestor 2. HTN - controlled - continue Micardis 3. Obesity - her exercise is limited right now due to knee pain but she is doing weight watchers    Current medicines are reviewed at length with the patient today.  The patient does not have concerns regarding medicines.  The following changes have been made:  no change  Labs/ tests ordered today: See above Assessment and Plan No orders of the defined types were placed in this encounter.     Disposition:   FU with me in 1 year  Signed, Sueanne Margarita, MD  11/19/2014 8:39 AM    [facility] [place], [place], [place]  [address] Phone: [phone]; Fax: [phone]

## 2014-11-29 ENCOUNTER — Other Ambulatory Visit: Payer: Self-pay | Admitting: Cardiology

## 2014-12-19 ENCOUNTER — Encounter: Payer: Self-pay | Admitting: Radiation Oncology

## 2014-12-19 ENCOUNTER — Ambulatory Visit
Admission: RE | Admit: 2014-12-19 | Discharge: 2014-12-19 | Disposition: A | Discharge status: Home / Self Care | Payer: Medicare Other | Source: Ambulatory Visit | Attending: Radiation Oncology | Admitting: Radiation Oncology

## 2014-12-19 VITALS — BP 139/52 | HR 77 | Resp 16 | Wt 207.1 lb

## 2014-12-19 DIAGNOSIS — C50412 Malignant neoplasm of upper-outer quadrant of left female breast: Secondary | ICD-10-CM

## 2014-12-19 NOTE — Progress Notes (Signed)
Weight and vitals stable. Skin within treatment field has returned to normal color and appearance with exception of plum size area just below left axilla. Reports she was switched from tamoxifen to anastrozole by her med onc. Reports mild fatigue continues. Full ROM of upper extremities noted without edema.   BP 139/52 mmHg  Pulse 77  Resp 16  Wt 207 lb 1.6 oz (93.94 kg) Wt Readings from Last 3 Encounters:  12/19/14 207 lb 1.6 oz (93.94 kg)  11/19/14 207 lb (93.895 kg)  11/18/14 209 lb 1 oz (94.83 kg)

## 2014-12-19 NOTE — Progress Notes (Signed)
Radiation Oncology         [phone] ________________________________  Name: Tammy Cooper MRN: [id number]  Date: 12/19/2014  DOB: Jan 01, 1951  Follow-Up Visit Note  Outpatient  CC: Lynne Logan, MD  Coralie Keens, MD  Diagnosis and Prior Radiotherapy:    ICD-9-CM ICD-10-CM   1. Breast cancer of upper-outer quadrant of left female breast 174.4 C50.412     Radiation Treatment Dates: 09/29/2014-10/31/2014 Site/dose:   1) Left breast / 50 Gy in 25 fractions  Narrative:  The patient returns today for routine follow-up. Skin within treatment field has returned to normal color and appearance with exception of plum size area just below left axilla. Reports she was switched from tamoxifen to anastrozole by her med/onc. She reports being on the Anastrozole for a month. She has quit using the radiaplex when the txt area has cleared. She denies using vitamin E lotion on the area. Reports mild fatigue continues. Full ROM of upper extremities noted without edema.   ALLERGIES:  is allergic to lipitor.  Meds: Current Outpatient Prescriptions  Medication Sig Dispense Refill  . anastrozole (ARIMIDEX) 1 MG tablet Take 1 tablet (1 mg total) by mouth daily. 90 tablet 3  . aspirin 81 MG tablet Take 81 mg by mouth daily.      . calcium-vitamin D (OSCAL) 250-125 MG-UNIT per tablet Take 1 tablet by mouth daily.      . cholecalciferol (VITAMIN D) 1000 UNITS tablet Take 1,000 Units by mouth daily.    [name] Co-Enzyme Q10 200 MG CAPS Take 200 mg by mouth daily.    . CRESTOR 10 MG tablet TAKE (1/2) TABLET DAILY. 45 tablet 11  . Multiple Vitamins-Minerals (MULTIVITAMIN PO) Take by mouth 2 (two) times daily. Vemma vitamin    . telmisartan-hydrochlorothiazide (MICARDIS HCT) 40-12.5 MG per tablet Take 1 tablet by mouth daily. 90 tablet 1  . venlafaxine XR (EFFEXOR-XR) 37.5 MG 24 hr capsule Take 1 capsule (37.5 mg total) by mouth daily with breakfast. 30 capsule 6  . ZETIA 10 MG tablet TAKE 1 TABLET  ONCE DAILY. 90 tablet 3  . non-metallic deodorant (ALRA) MISC Apply 1 application topically daily as needed.     No current facility-administered medications for this encounter.    Physical Findings: The patient is in no acute distress. Patient is alert and oriented.  weight is 207 lb 1.6 oz (93.94 kg). Her blood pressure is 139/52 and her pulse is 77. Her respiration is 16.  Left breast skin has healed well.  Lab Findings: Lab Results  Component Value Date   WBC 6.6 10/19/2010   HGB 12.7 10/19/2010   HCT 38.1 10/19/2010   MCV 85.8 10/19/2010   PLT 303 10/19/2010    Radiographic Findings: No results found.  Impression/Plan: I encouraged her to continue with yearly mammography and followup with medical oncology. I will see her back on an as-needed basis. I have encouraged her to call if she has any issues or concerns in the future. I wished her the very best. I also advised the pt to use vitamin E lotion or oil over the treatment area to facilitate thorough healing.   This document serves as a record of services personally performed by Eppie Gibson, MD. It was created on her behalf by Darcus Austin, a trained medical scribe. The creation of this record is based on the scribe's personal observations and the provider's statements to them. This document has been checked and approved by the attending provider.  _____________________________________   Eppie Gibson, MD

## 2015-02-18 NOTE — Assessment & Plan Note (Signed)
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 Prognosis: Based on [facility] prognostic nomogram, with antiestrogen therapy and radiation therapy her 5 year risk of recurrence of 1% in 10 year risk is a 2%. Adjuvant radiation completed 10/31/2014 Current treatment: Adjuvant anastrozole 1 mg daily ( chosen because of history of TIA)  Major depression: Along with severe hot flashes. I recommended starting Effexor XR 37.5 mg once daily. This would help both depression and hot flashes.  Return to clinic in 6 months for follow-up.

## 2015-02-19 ENCOUNTER — Encounter: Payer: Self-pay | Admitting: Hematology and Oncology

## 2015-02-19 ENCOUNTER — Ambulatory Visit (HOSPITAL_BASED_OUTPATIENT_CLINIC_OR_DEPARTMENT_OTHER): Payer: Medicare Other | Admitting: Hematology and Oncology

## 2015-02-19 VITALS — BP 134/71 | HR 82 | Temp 98.5°F | Resp 18 | Ht 63.0 in | Wt 207.6 lb

## 2015-02-19 DIAGNOSIS — D0512 Intraductal carcinoma in situ of left breast: Secondary | ICD-10-CM | POA: Diagnosis not present

## 2015-02-19 DIAGNOSIS — C50412 Malignant neoplasm of upper-outer quadrant of left female breast: Secondary | ICD-10-CM

## 2015-02-19 DIAGNOSIS — N951 Menopausal and female climacteric states: Secondary | ICD-10-CM | POA: Diagnosis not present

## 2015-02-19 DIAGNOSIS — F329 Major depressive disorder, single episode, unspecified: Secondary | ICD-10-CM | POA: Diagnosis not present

## 2015-02-19 NOTE — Progress Notes (Signed)
Patient Care Team: Donald Prose, MD as PCP - General (Family Medicine) Sueanne Margarita, MD as Consulting Physician (Cardiology)  DIAGNOSIS: No matching staging information was found for the patient.  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 She is tolerating it fairly well. She does have occasional hot flashes. She is able to manage it fairly well. She is doing yoga for exercise. This has been helping her back as well. Denies any lumps or nodules in the breasts.  REVIEW OF SYSTEMS:   Constitutional: Denies fevers, chills or abnormal weight loss Eyes: Denies blurriness of vision Ears, nose, mouth, throat, and face: Denies mucositis or sore throat Respiratory: Denies cough, dyspnea or wheezes Cardiovascular: Denies palpitation, chest discomfort or lower extremity swelling Gastrointestinal:  Denies nausea, heartburn or change in bowel habits Skin: Denies abnormal skin rashes Lymphatics: Denies new lymphadenopathy or easy bruising Neurological:Denies numbness, tingling or new weaknesses Behavioral/Psych: Mood is stable, no new changes  Breast:  denies any pain or lumps or nodules in either breasts All other systems were reviewed with the patient and are negative.  I have reviewed the past medical history, past surgical history, social history and family history with the patient and they are unchanged from  previous note.  ALLERGIES:  is allergic to lipitor.  MEDICATIONS:  Current Outpatient Prescriptions  Medication Sig Dispense Refill  . anastrozole (ARIMIDEX) 1 MG tablet Take 1 tablet (1 mg total) by mouth daily. 90 tablet 3  . aspirin 81 MG tablet Take 81 mg by mouth daily.      . calcium-vitamin D (OSCAL) 250-125 MG-UNIT per tablet Take 1 tablet by mouth daily.      . cholecalciferol (VITAMIN D) 1000 UNITS tablet Take 1,000 Units by mouth daily.    [name] Co-Enzyme Q10 200 MG CAPS Take 200 mg by mouth daily.    . CRESTOR 10 MG tablet TAKE (1/2) TABLET DAILY. 45 tablet 11  . Multiple Vitamins-Minerals (MULTIVITAMIN PO) Take by mouth 2 (two) times daily. Vemma vitamin    . non-metallic deodorant (ALRA) MISC Apply 1 application topically daily as needed.    [name] telmisartan-hydrochlorothiazide (MICARDIS HCT) 40-12.5 MG per tablet Take 1 tablet by mouth daily. 90 tablet 1  . venlafaxine XR (EFFEXOR-XR) 37.5 MG 24 hr capsule Take 1 capsule (37.5 mg total) by mouth daily with breakfast. 30 capsule 6  . ZETIA 10 MG tablet TAKE 1 TABLET ONCE DAILY. 90 tablet 3   No current facility-administered medications for this visit.    PHYSICAL EXAMINATION: ECOG PERFORMANCE STATUS: 1 - Symptomatic but completely ambulatory  Filed Vitals:   02/19/15 1005  BP: 134/71  Pulse: 82  Temp: 98.5 F (36.9 C)  Resp: 18   Filed Weights   02/19/15 1005  Weight: 207 lb 9.6 oz (94.167 kg)    GENERAL:alert, no distress and comfortable SKIN: skin color, texture, turgor are normal, no rashes or significant lesions EYES: normal,  Conjunctiva are pink and non-injected, sclera clear OROPHARYNX:no exudate, no erythema and lips, buccal mucosa, and tongue normal  NECK: supple, thyroid normal size, non-tender, without nodularity LYMPH:  no palpable lymphadenopathy in the cervical, axillary or inguinal LUNGS: clear to auscultation and percussion with normal breathing effort HEART: regular rate & rhythm and no murmurs and  no lower extremity edema ABDOMEN:abdomen soft, non-tender and normal bowel sounds Musculoskeletal:no cyanosis of digits and no clubbing  NEURO: alert & oriented x 3 with fluent speech, no focal motor/sensory deficits  LABORATORY DATA:  I have reviewed the data as listed   Chemistry      Component Value Date/Time   NA 139 07/29/2014 1453   K 4.0 07/29/2014 1453   CL 102 07/29/2014 1453   CO2 28 07/29/2014 1453   BUN 10 07/29/2014 1453   CREATININE 0.71 07/29/2014 1453      Component Value Date/Time   CALCIUM 9.7 07/29/2014 1453   ALKPHOS 85 09/02/2014 0730   AST 20 09/02/2014 0730   ALT 24 09/02/2014 0730   BILITOT 0.4 09/02/2014 0730       Lab Results  Component Value Date   WBC 6.6 10/19/2010   HGB 12.7 10/19/2010   HCT 38.1 10/19/2010   MCV 85.8 10/19/2010   PLT 303 10/19/2010   NEUTROABS 3.8 12/07/2006   ASSESSMENT & PLAN:  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 Prognosis: Based on [facility] prognostic nomogram, with antiestrogen therapy and radiation therapy her 5 year risk of recurrence of 1% in 10 year risk is a 2%. Adjuvant radiation completed 10/31/2014 Current treatment: Adjuvant anastrozole 1 mg daily ( chosen because of history of TIA)  Major depression: Along with severe hot flashes. I recommended starting Effexor XR 37.5 mg once daily. This would help both depression and hot flashes.  Anastrozole toxicities: 1. Hot flashes  Breast Cancer Surveillance: 1. Breast exam every 6 months 2. Mammogram to be done in January 2017s     Return to clinic in 6 months for follow-up and breast exams.   No orders of the defined types were placed in this encounter.   The patient has a good understanding of the overall plan. she agrees with it. she will call with any problems that may develop before the next visit here.   Rulon Eisenmenger,  MD 02/19/2015

## 2015-03-03 ENCOUNTER — Other Ambulatory Visit: Payer: Medicare Other

## 2015-03-10 ENCOUNTER — Other Ambulatory Visit: Payer: Medicare Other

## 2015-04-13 ENCOUNTER — Other Ambulatory Visit: Payer: Self-pay | Admitting: Hematology and Oncology

## 2015-04-13 DIAGNOSIS — R921 Mammographic calcification found on diagnostic imaging of breast: Secondary | ICD-10-CM

## 2015-05-11 ENCOUNTER — Ambulatory Visit
Admission: RE | Admit: 2015-05-11 | Discharge: 2015-05-11 | Disposition: A | Discharge status: Home / Self Care | Payer: Medicare Other | Source: Ambulatory Visit | Attending: Hematology and Oncology | Admitting: Hematology and Oncology

## 2015-05-11 DIAGNOSIS — R921 Mammographic calcification found on diagnostic imaging of breast: Secondary | ICD-10-CM

## 2015-06-12 ENCOUNTER — Other Ambulatory Visit: Payer: Self-pay | Admitting: Cardiology

## 2015-07-06 ENCOUNTER — Other Ambulatory Visit: Payer: Self-pay | Admitting: Hematology and Oncology

## 2015-07-06 DIAGNOSIS — C50412 Malignant neoplasm of upper-outer quadrant of left female breast: Secondary | ICD-10-CM

## 2015-07-15 ENCOUNTER — Encounter (HOSPITAL_COMMUNITY): Payer: Self-pay

## 2015-07-15 NOTE — Patient Instructions (Addendum)
YOUR PROCEDURE IS SCHEDULED ON :  08/04/15  REPORT TO [place] MAIN ENTRANCE FOLLOW SIGNS TO EAST ELEVATOR - GO TO 3rd FLOOR CHECK IN AT 3 EAST NURSES STATION (SHORT STAY) AT:  11:15 AM  CALL THIS NUMBER IF YOU HAVE PROBLEMS THE MORNING OF SURGERY [phone]  REMEMBER:ONLY 1 PER PERSON MAY GO TO SHORT STAY WITH YOU TO GET READY THE MORNING OF YOUR SURGERY  DO NOT EAT FOOD  AFTER MIDNIGHT  MAY HAVE CLEAR LIQUIDS UNTIL 8:45 AM  TAKE THESE MEDICINES THE MORNING OF SURGERY: EFFEXOR / ANASTROZOLE  CLEAR LIQUID DIET  Foods Allowed                                                                     Foods Excluded  Coffee and tea, regular and decaf                             liquids that you cannot  Plain Jell-O in any flavor                                             see through such as: Fruit ices (not with fruit pulp)                                     milk, soups, orange juice  Iced Popsicles                                                          All solid food Carbonated beverages, regular and diet                                    Cranberry, grape and apple juices Sports drinks like Gatorade Lightly seasoned clear broth or consume(fat free) Sugar, honey syrup    _____________________________________________________________________    YOU MAY NOT HAVE ANY METAL ON YOUR BODY INCLUDING HAIR PINS AND PIERCING'S. DO NOT WEAR JEWELRY, MAKEUP, LOTIONS, POWDERS OR PERFUMES. DO NOT WEAR NAIL POLISH. DO NOT SHAVE 48 HRS PRIOR TO SURGERY. MEN MAY SHAVE FACE AND NECK.  DO NOT [place]. [facility] IS NOT RESPONSIBLE FOR VALUABLES.  CONTACTS, DENTURES OR PARTIALS MAY NOT BE WORN TO SURGERY. LEAVE SUITCASE IN CAR. CAN BE BROUGHT TO ROOM AFTER SURGERY.  PATIENTS DISCHARGED THE DAY OF SURGERY WILL NOT BE ALLOWED TO DRIVE HOME.  PLEASE READ OVER THE FOLLOWING INSTRUCTION  SHEETS _________________________________________________________________________________                                          [place] - PREPARING FOR SURGERY  Before surgery, you  can play an important role.  Because skin is not sterile, your skin needs to be as free of germs as possible.  You can reduce the number of germs on your skin by washing with CHG (chlorahexidine gluconate) soap before surgery.  CHG is an antiseptic cleaner which kills germs and bonds with the skin to continue killing germs even after washing. Please DO NOT use if you have an allergy to CHG or antibacterial soaps.  If your skin becomes reddened/irritated stop using the CHG and inform your nurse when you arrive at Short Stay. Do not shave (including legs and underarms) for at least 48 hours prior to the first CHG shower.  You may shave your face. Please follow these instructions carefully:   1.  Shower with CHG Soap the night before surgery and the  morning of Surgery.   2.  If you choose to wash your hair, wash your hair first as usual with your  normal  Shampoo.   3.  After you shampoo, rinse your hair and body thoroughly to remove the  shampoo.                                         4.  Use CHG as you would any other liquid soap.  You can apply chg directly  to the skin and wash . Gently wash with scrungie or clean wascloth    5.  Apply the CHG Soap to your body ONLY FROM THE NECK DOWN.   Do not use on open                           Wound or open sores. Avoid contact with eyes, ears mouth and genitals (private parts).                        Genitals (private parts) with your normal soap.              6.  Wash thoroughly, paying special attention to the area where your surgery  will be performed.   7.  Thoroughly rinse your body with warm water from the neck down.   8.  DO NOT shower/wash with your normal soap after using and rinsing off  the CHG Soap .                9.  Pat yourself dry with a clean  towel.             10.  Wear clean night clothes to bed after shower             11.  Place clean sheets on your bed the night of your first shower and do not  sleep with pets.  Day of Surgery : Do not apply any lotions/deodorants the morning of surgery.  Please wear clean clothes to the hospital/surgery center.  FAILURE TO FOLLOW THESE INSTRUCTIONS MAY RESULT IN THE CANCELLATION OF YOUR SURGERY    PATIENT SIGNATURE_________________________________  ______________________________________________________________________     Adam [place]  An incentive spirometer is a tool that can help keep your lungs clear and active. This tool measures how well you are filling your lungs with each breath. Taking long deep breaths may help reverse or decrease the chance of developing breathing (pulmonary) problems (especially infection) following:  A long period of time  when you are unable to move or be active. BEFORE THE PROCEDURE   If the spirometer includes an indicator to show your best effort, your nurse or respiratory therapist will set it to a desired goal.  If possible, sit up straight or lean slightly forward. Try not to slouch.  Hold the incentive spirometer in an upright position. INSTRUCTIONS FOR USE   Sit on the edge of your bed if possible, or sit up as far as you can in bed or on a chair.  Hold the incentive spirometer in an upright position.  Breathe out normally.  Place the mouthpiece in your mouth and seal your lips tightly around it.  Breathe in slowly and as deeply as possible, raising the piston or the ball toward the top of the column.  Hold your breath for 3-5 seconds or for as long as possible. Allow the piston or ball to fall to the bottom of the column.  Remove the mouthpiece from your mouth and breathe out normally.  Rest for a few seconds and repeat Steps 1 through 7 at least 10 times every 1-2 hours when you are awake. Take your time and take a few  normal breaths between deep breaths.  The spirometer may include an indicator to show your best effort. Use the indicator as a goal to work toward during each repetition.  After each set of 10 deep breaths, practice coughing to be sure your lungs are clear. If you have an incision (the cut made at the time of surgery), support your incision when coughing by placing a pillow or rolled up towels firmly against it. Once you are able to get out of bed, walk around indoors and cough well. You may stop using the incentive spirometer when instructed by your caregiver.  RISKS AND COMPLICATIONS  Take your time so you do not get dizzy or light-headed.  If you are in pain, you may need to take or ask for pain medication before doing incentive spirometry. It is harder to take a deep breath if you are having pain. AFTER USE  Rest and breathe slowly and easily.  It can be helpful to keep track of a log of your progress. Your caregiver can provide you with a simple table to help with this. If you are using the spirometer at home, follow these instructions: [place] IF:   You are having difficultly using the spirometer.  You have trouble using the spirometer as often as instructed.  Your pain medication is not giving enough relief while using the spirometer.  You develop fever of 100.5 F (38.1 C) or higher. SEEK IMMEDIATE MEDICAL CARE IF:   You cough up bloody sputum that had not been present before.  You develop fever of 102 F (38.9 C) or greater.  You develop worsening pain at or near the incision site. MAKE SURE YOU:   Understand these instructions.  Will watch your condition.  Will get help right away if you are not doing well or get worse. Document Released: 08/22/2006 Document Revised: 07/04/2011 Document Reviewed: 10/23/2006 ExitCare Patient Information 2014 Exit[employer], [place].   ________________________________________________________________________  WHAT IS A BLOOD  TRANSFUSION? Blood Transfusion Information  A transfusion is the replacement of blood or some of its parts. Blood is made up of multiple cells which provide different functions.  Red blood cells carry oxygen and are used for blood loss replacement.  White blood cells fight against infection.  Platelets control bleeding.  Plasma helps clot blood.  Other blood products are available for specialized needs, such as hemophilia or other clotting disorders. BEFORE THE TRANSFUSION  Who gives blood for transfusions?   Healthy volunteers who are fully evaluated to make sure their blood is safe. This is blood bank blood. Transfusion therapy is the safest it has ever been in the practice of medicine. Before blood is taken from a donor, a complete history is taken to make sure that person has no history of diseases nor engages in risky social behavior (examples are intravenous drug use or sexual activity with multiple partners). The donor's travel history is screened to minimize risk of transmitting infections, such as malaria. The donated blood is tested for signs of infectious diseases, such as HIV and hepatitis. The blood is then tested to be sure it is compatible with you in order to minimize the chance of a transfusion reaction. If you or a relative donates blood, this is often done in anticipation of surgery and is not appropriate for emergency situations. It takes many days to process the donated blood. RISKS AND COMPLICATIONS Although transfusion therapy is very safe and saves many lives, the main dangers of transfusion include:   Getting an infectious disease.  Developing a transfusion reaction. This is an allergic reaction to something in the blood you were given. Every precaution is taken to prevent this. The decision to have a blood transfusion has been considered carefully by your caregiver before blood is given. Blood is not given unless the benefits outweigh the risks. AFTER THE  TRANSFUSION  Right after receiving a blood transfusion, you will usually feel much better and more energetic. This is especially true if your red blood cells have gotten low (anemic). The transfusion raises the level of the red blood cells which carry oxygen, and this usually causes an energy increase.  The nurse administering the transfusion will monitor you carefully for complications. HOME CARE INSTRUCTIONS  No special instructions are needed after a transfusion. You may find your energy is better. Speak with your caregiver about any limitations on activity for underlying diseases you may have. SEEK MEDICAL CARE IF:   Your condition is not improving after your transfusion.  You develop redness or irritation at the intravenous (IV) site. SEEK IMMEDIATE MEDICAL CARE IF:  Any of the following symptoms occur over the next 12 hours:  Shaking chills.  You have a temperature by mouth above 102 F (38.9 C), not controlled by medicine.  Chest, back, or muscle pain.  People around you feel you are not acting correctly or are confused.  Shortness of breath or difficulty breathing.  Dizziness and fainting.  You get a rash or develop hives.  You have a decrease in urine output.  Your urine turns a dark color or changes to pink, red, or brown. Any of the following symptoms occur over the next 10 days:  You have a temperature by mouth above 102 F (38.9 C), not controlled by medicine.  Shortness of breath.  Weakness after normal activity.  The white part of the eye turns yellow (jaundice).  You have a decrease in the amount of urine or are urinating less often.  Your urine turns a dark color or changes to pink, red, or brown. Document Released: 04/08/2000 Document Revised: 07/04/2011 Document Reviewed: 11/26/2007 [facility] Patient Information 2014 [place], [place].  _______________________________________________________________________

## 2015-07-16 ENCOUNTER — Other Ambulatory Visit (INDEPENDENT_AMBULATORY_CARE_PROVIDER_SITE_OTHER): Payer: Medicare Other | Admitting: *Deleted

## 2015-07-16 DIAGNOSIS — E785 Hyperlipidemia, unspecified: Secondary | ICD-10-CM

## 2015-07-16 DIAGNOSIS — I10 Essential (primary) hypertension: Secondary | ICD-10-CM

## 2015-07-16 NOTE — Addendum Note (Signed)
Addended by: Eulis Foster on: 07/16/2015 09:32 AM   Modules accepted: Orders

## 2015-07-16 NOTE — Addendum Note (Signed)
Addended by: Eulis Foster on: 07/16/2015 09:36 AM   Modules accepted: Orders

## 2015-07-17 ENCOUNTER — Encounter (HOSPITAL_COMMUNITY): Payer: Self-pay

## 2015-07-17 ENCOUNTER — Encounter (HOSPITAL_COMMUNITY)
Admission: RE | Admit: 2015-07-17 | Discharge: 2015-07-17 | Disposition: A | Discharge status: Home / Self Care | Payer: Medicare Other | Source: Ambulatory Visit | Attending: Orthopedic Surgery | Admitting: Orthopedic Surgery

## 2015-07-17 DIAGNOSIS — Z01812 Encounter for preprocedural laboratory examination: Secondary | ICD-10-CM | POA: Diagnosis present

## 2015-07-17 DIAGNOSIS — I10 Essential (primary) hypertension: Secondary | ICD-10-CM | POA: Diagnosis not present

## 2015-07-17 DIAGNOSIS — Z01810 Encounter for preprocedural cardiovascular examination: Secondary | ICD-10-CM | POA: Diagnosis not present

## 2015-07-17 HISTORY — DX: Adverse effect of unspecified anesthetic, initial encounter: T41.45XA

## 2015-07-17 HISTORY — DX: Spinal stenosis, site unspecified: M48.00

## 2015-07-17 HISTORY — DX: Nausea with vomiting, unspecified: Z98.890

## 2015-07-17 HISTORY — DX: Other specified postprocedural states: R11.2

## 2015-07-17 HISTORY — DX: Personal history of urinary (tract) infections: Z87.440

## 2015-07-17 HISTORY — DX: Other complications of anesthesia, initial encounter: T88.59XA

## 2015-07-17 HISTORY — DX: Constipation, unspecified: K59.00

## 2015-07-17 HISTORY — DX: Personal history of malignant neoplasm of breast: Z85.3

## 2015-07-17 LAB — CBC
HCT: 37.3 % (ref 36.0–46.0)
Hemoglobin: 12.7 g/dL (ref 12.0–15.0)
MCH: 30.3 pg (ref 26.0–34.0)
MCHC: 34 g/dL (ref 30.0–36.0)
MCV: 89 fL (ref 78.0–100.0)
Platelets: 264 10*3/uL (ref 150–400)
RBC: 4.19 MIL/uL (ref 3.87–5.11)
RDW: 13.5 % (ref 11.5–15.5)
WBC: 5.1 10*3/uL (ref 4.0–10.5)

## 2015-07-17 LAB — URINALYSIS, ROUTINE W REFLEX MICROSCOPIC
BILIRUBIN URINE: NEGATIVE
Glucose, UA: NEGATIVE mg/dL
HGB URINE DIPSTICK: NEGATIVE
KETONES UR: NEGATIVE mg/dL
Nitrite: NEGATIVE
PH: 5.5 (ref 5.0–8.0)
Protein, ur: NEGATIVE mg/dL
SPECIFIC GRAVITY, URINE: 1.019 (ref 1.005–1.030)

## 2015-07-17 LAB — PROTIME-INR
INR: 1 (ref 0.00–1.49)
PROTHROMBIN TIME: 13 s (ref 11.6–15.2)

## 2015-07-17 LAB — BASIC METABOLIC PANEL
ANION GAP: 11 (ref 5–15)
BUN: 13 mg/dL (ref 6–20)
CALCIUM: 10.1 mg/dL (ref 8.9–10.3)
CO2: 26 mmol/L (ref 22–32)
Chloride: 106 mmol/L (ref 101–111)
Creatinine, Ser: 0.69 mg/dL (ref 0.44–1.00)
GFR calc Af Amer: >60 mL/min (ref >60)
GLUCOSE: 97 mg/dL (ref 65–99)
Potassium: 4 mmol/L (ref 3.5–5.1)
SODIUM: 143 mmol/L (ref 135–145)

## 2015-07-17 LAB — URINE MICROSCOPIC-ADD ON

## 2015-07-17 LAB — SURGICAL PCR SCREEN
MRSA, PCR: NEGATIVE
STAPHYLOCOCCUS AUREUS: POSITIVE — AB

## 2015-07-17 LAB — APTT: aPTT: 29 seconds (ref 24–37)

## 2015-07-17 NOTE — Progress Notes (Signed)
Need OP permit changed in EPIC please - Pt staes she is to have RIGHT total knee - not LEFT - thank you

## 2015-07-17 NOTE — Progress Notes (Signed)
Rx Mupuricin called to [place] notified

## 2015-07-17 NOTE — Progress Notes (Signed)
   07/17/15 1008  OBSTRUCTIVE SLEEP APNEA  Have you ever been diagnosed with sleep apnea through a sleep study? No  Do you snore loudly (loud enough to be heard through closed doors)?  1  Do you often feel tired, fatigued, or sleepy during the daytime (such as falling asleep during driving or talking to someone)? 1  Has anyone observed you stop breathing during your sleep? 0  Do you have, or are you being treated for high blood pressure? 1  BMI more than 35 kg/m2? 1  Age > 50 (1-yes) 1  Neck circumference greater than:Female 16 inches or larger, Female 17inches or larger? 0  Female Gender (Yes=1) 0  Obstructive Sleep Apnea Score 5  Score 5 or greater  Results sent to PCP

## 2015-07-19 NOTE — H&P (Signed)
TOTAL KNEE ADMISSION H&P  Patient is being admitted for right total knee arthroplasty.  Subjective:  Chief Complaint:    Right knee primary OA / pain  HPI: Tammy Cooper, 65 y.o. female, has a history of pain and functional disability in the right knee due to arthritis and has failed non-surgical conservative treatments for greater than 12 weeks to include NSAID's and/or analgesics, corticosteriod injections and activity modification.  Onset of symptoms was gradual, starting >10 years ago with gradually worsening course since that time. The patient noted no past surgery on the right knee(s).  Patient currently rates pain in the right knee(s) at 8 out of 10 with activity. Patient has night pain, worsening of pain with activity and weight bearing, pain that interferes with activities of daily living, pain with passive range of motion, crepitus and joint swelling.  Patient has evidence of periarticular osteophytes and joint space narrowing by imaging studies.  There is no active infection.   Risks, benefits and expectations were discussed with the patient.  Risks including but not limited to the risk of anesthesia, blood clots, nerve damage, blood vessel damage, failure of the prosthesis, infection and up to and including death.  Patient understand the risks, benefits and expectations and wishes to proceed with surgery.   PCP: Lynne Logan, MD  D/C Plans:      Home  Post-op Meds:       No Rx given   Tranexamic Acid:      To be given - IV   Decadron:      Is to be given  FYI:     ASA  Norco    Patient Active Problem List   Diagnosis Date Noted  . Breast cancer of upper-outer quadrant of left female breast ([place]) 08/18/2014  . Obesity 11/12/2013  . DYSPNEA/SHORTNESS OF BREATH 12/07/2006  . Hyperlipidemia 08/30/2006  . TIA 08/30/2006  . DEGENERATIVE JOINT DISEASE, RIGHT KNEE 08/28/2006  . TAH/BSO, HX OF 08/28/2006  . HSV 05/23/2006  . HYPERTHYROIDISM 05/23/2006  . DEPRESSION  05/23/2006  . Essential hypertension 05/23/2006   Past Medical History  Diagnosis Date  . Hypertension   . Hypercholesteremia   . Arthritis   . Anxiety   . Breast cancer ([place]) 05/28/14    Left breast in situ   . Complication of anesthesia   . PONV (postoperative nausea and vomiting)     "a little nausea"  . Stroke [facility])     H/O TIA in 2006, MRA clear,arteriogram was done which was normal  . Spinal stenosis   . Constipation     occasiopnal  . History of recurrent cystitis   . History of breast cancer 2016    Past Surgical History  Procedure Laterality Date  . Rotator cuff repair  2005    rt shoulder  . Back surgery  07/12/2010    stenosis L4 + L5  . Anal skin tag removal  10/21/10  . Vaginal hysterectomy  2003    total hyst-BSO-Ant/post repair  . Colonoscopy    . Hemorroidectomy    . Breast lumpectomy with radioactive seed localization Left 07/31/2014    Procedure: LEFT BREAST LUMPECTOMY WITH RADIOACTIVE SEED LOCALIZATION;  Surgeon: Coralie Keens, MD;  Location: [place];  Service: General;  Laterality: Left;  . Tonsillectomy    . Knee arthroscopy  2010    left  . Bladder tack      WITH THE HYSTERECTOMY    No prescriptions prior to admission  Allergies  Allergen Reactions  . Lipitor [Atorvastatin]     Muscle aches    Social History  Substance Use Topics  . Smoking status: Never Smoker   . Smokeless tobacco: Never Used  . Alcohol Use: Yes     Comment: ocassionally    Family History  Problem Relation Age of Onset  . Cancer Father     lung  . Hypertension Mother   . Hypertension Sister   . Heart attack Neg Hx      Review of Systems  Constitutional: Negative.   HENT: Positive for tinnitus.   Eyes: Negative.   Respiratory: Positive for shortness of breath (on exertion).   Cardiovascular: Negative.   Gastrointestinal: Positive for constipation.  Genitourinary: Positive for frequency.  Musculoskeletal: Positive for back pain and joint  pain.  Skin: Negative.   Neurological: Negative.   Endo/Heme/Allergies: Negative.   Psychiatric/Behavioral: Positive for depression. The patient is nervous/anxious.     Objective:  Physical Exam  Constitutional: She is oriented to person, place, and time. She appears well-developed.  HENT:  Head: Normocephalic.  Eyes: Pupils are equal, round, and reactive to light.  Neck: Neck supple. No JVD present. No tracheal deviation present. No thyromegaly present.  Cardiovascular: Normal rate, regular rhythm, normal heart sounds and intact distal pulses.   Respiratory: Effort normal and breath sounds normal. No stridor. No respiratory distress. She has no wheezes.  GI: Soft. There is no tenderness. There is no guarding.  Musculoskeletal:       Right knee: She exhibits decreased range of motion, swelling and bony tenderness. She exhibits no ecchymosis, no deformity, no laceration, no erythema and normal alignment. Tenderness found.  Lymphadenopathy:    She has no cervical adenopathy.  Neurological: She is alert and oriented to person, place, and time.  Skin: Skin is warm and dry.  Psychiatric: She has a normal mood and affect.      Labs:  Estimated body mass index is 36.78 kg/(m^2) as calculated from the following:   Height as of 02/19/15: 5\' 3"  (1.6 m).   Weight as of 02/19/15: 94.167 kg (207 lb 9.6 oz).   Imaging Review Plain radiographs demonstrate severe degenerative joint disease of the right knee(s). The overall alignment is neutral. The bone quality appears to be good for age and reported activity level.  Assessment/Plan:  End stage arthritis, right knee   The patient history, physical examination, clinical judgment of the provider and imaging studies are consistent with end stage degenerative joint disease of the right knee(s) and total knee arthroplasty is deemed medically necessary. The treatment options including medical management, injection therapy arthroscopy and  arthroplasty were discussed at length. The risks and benefits of total knee arthroplasty were presented and reviewed. The risks due to aseptic loosening, infection, stiffness, patella tracking problems, thromboembolic complications and other imponderables were discussed. The patient acknowledged the explanation, agreed to proceed with the plan and consent was signed. Patient is being admitted for inpatient treatment for surgery, pain control, PT, OT, prophylactic antibiotics, VTE prophylaxis, progressive ambulation and ADL's and discharge planning. The patient is planning to be discharged home with home health services.      [place] Pugh Jerine Surles   PA-C  07/19/2015, 11:26 PM

## 2015-07-21 LAB — CARDIO IQ(R) ADVANCED LIPID PANEL
Apolipoprotein B: 82 mg/dL (ref 49–103)
CHOLESTEROL/HDL RATIO (CARDIO IQ ADV LIPID PANEL): 4 calc (ref <=5.0)
Cholesterol, Total: 202 mg/dL — ABNORMAL HIGH (ref 125–200)
HDL CHOLESTEROL (CARDIO IQ ADV LIPID PANEL): 50 mg/dL (ref >=46)
LDL CHOLESTEROL CALCULATED (CARDIO IQ ADV LIPID PANEL): 109 mg/dL
LDL LARGE: 7406 nmol/L (ref 5038–17886)
LDL Medium: 276 nmol/L (ref 121–397)
LDL PEAK SIZE: 211.5 Angstrom — AB (ref >=218.2)
LDL Particle Number: 1411 nmol/L (ref 1016–2185)
LDL SMALL: 300 nmol/L (ref 115–386)
Lipoprotein (a): 30 nmol/L (ref <75)
Non-HDL Cholesterol: 152 mg/dL
TRIGLYCERIDES (CARDIO IQ ADV LIPID PANEL): 215 mg/dL — AB

## 2015-07-21 NOTE — Progress Notes (Signed)
SURGICAL PERMIT IN EPIC  NEEDS CORRECTION TO RT KNEE PLEASE

## 2015-07-30 ENCOUNTER — Telehealth: Payer: Self-pay

## 2015-07-30 ENCOUNTER — Encounter: Payer: Self-pay | Admitting: Cardiology

## 2015-07-30 DIAGNOSIS — E785 Hyperlipidemia, unspecified: Secondary | ICD-10-CM

## 2015-07-30 MED ORDER — ROSUVASTATIN CALCIUM 10 MG PO TABS: 10.0000 mg | ORAL_TABLET | Freq: Every day | ORAL | Status: DC

## 2015-07-30 NOTE — Telephone Encounter (Signed)
-----   Message from Sueanne Margarita, MD sent at 07/21/2015  9:15 PM EDT ----- LDL particle number has increased and LDL pattern B - increase crestor to 10mg  daily and repeat NMR panel in 3 months with LFTs in 4 weeks

## 2015-07-30 NOTE — Telephone Encounter (Signed)
See MyChart messages.   Crestor 10 mg daily rx called in. LFTs scheduled May 15. NMR scheduled June 30. Patient agrees with treatment plan.

## 2015-08-04 ENCOUNTER — Encounter (HOSPITAL_COMMUNITY)
Admission: RE | Disposition: A | Discharge status: Home Health | Payer: Self-pay | Source: Ambulatory Visit | Attending: Orthopedic Surgery

## 2015-08-04 ENCOUNTER — Inpatient Hospital Stay (HOSPITAL_COMMUNITY): Payer: Medicare Other | Admitting: Certified Registered"

## 2015-08-04 ENCOUNTER — Inpatient Hospital Stay (HOSPITAL_COMMUNITY)
Admission: RE | Admit: 2015-08-04 | Discharge: 2015-08-05 | DRG: 470 | Disposition: A | Discharge status: Home Health | Payer: Medicare Other | Source: Ambulatory Visit | Attending: Orthopedic Surgery | Admitting: Orthopedic Surgery

## 2015-08-04 ENCOUNTER — Encounter (HOSPITAL_COMMUNITY): Payer: Self-pay | Admitting: *Deleted

## 2015-08-04 DIAGNOSIS — M1711 Unilateral primary osteoarthritis, right knee: Principal | ICD-10-CM | POA: Diagnosis present

## 2015-08-04 DIAGNOSIS — Z853 Personal history of malignant neoplasm of breast: Secondary | ICD-10-CM | POA: Diagnosis not present

## 2015-08-04 DIAGNOSIS — Z96651 Presence of right artificial knee joint: Secondary | ICD-10-CM

## 2015-08-04 DIAGNOSIS — Z6836 Body mass index (BMI) 36.0-36.9, adult: Secondary | ICD-10-CM | POA: Diagnosis not present

## 2015-08-04 DIAGNOSIS — Z8673 Personal history of transient ischemic attack (TIA), and cerebral infarction without residual deficits: Secondary | ICD-10-CM

## 2015-08-04 DIAGNOSIS — Z90710 Acquired absence of both cervix and uterus: Secondary | ICD-10-CM | POA: Diagnosis not present

## 2015-08-04 DIAGNOSIS — I10 Essential (primary) hypertension: Secondary | ICD-10-CM | POA: Diagnosis present

## 2015-08-04 DIAGNOSIS — Z01812 Encounter for preprocedural laboratory examination: Secondary | ICD-10-CM | POA: Diagnosis not present

## 2015-08-04 DIAGNOSIS — E7800 Pure hypercholesterolemia, unspecified: Secondary | ICD-10-CM | POA: Diagnosis present

## 2015-08-04 DIAGNOSIS — Z96659 Presence of unspecified artificial knee joint: Secondary | ICD-10-CM

## 2015-08-04 DIAGNOSIS — M25561 Pain in right knee: Secondary | ICD-10-CM | POA: Diagnosis present

## 2015-08-04 DIAGNOSIS — E669 Obesity, unspecified: Secondary | ICD-10-CM | POA: Diagnosis present

## 2015-08-04 DIAGNOSIS — M659 Synovitis and tenosynovitis, unspecified: Secondary | ICD-10-CM | POA: Diagnosis present

## 2015-08-04 HISTORY — PX: TOTAL KNEE ARTHROPLASTY: SHX125

## 2015-08-04 LAB — GLUCOSE, CAPILLARY: GLUCOSE-CAPILLARY: 228 mg/dL — AB (ref 65–99)

## 2015-08-04 LAB — ABO/RH: ABO/RH(D): O POS

## 2015-08-04 LAB — TYPE AND SCREEN
ABO/RH(D): O POS
ANTIBODY SCREEN: NEGATIVE

## 2015-08-04 SURGERY — ARTHROPLASTY, KNEE, TOTAL
Anesthesia: Monitor Anesthesia Care | Site: Knee | Laterality: Right

## 2015-08-04 MED ORDER — DEXTROSE 5 % IV SOLN
500.0000 mg | Freq: Four times a day (QID) | INTRAVENOUS | Status: DC | PRN
  Filled 2015-08-04: qty 5

## 2015-08-04 MED ORDER — MENTHOL 3 MG MT LOZG: 1.0000 | LOZENGE | OROMUCOSAL | Status: DC | PRN

## 2015-08-04 MED ORDER — CEFAZOLIN SODIUM-DEXTROSE 2-4 GM/100ML-% IV SOLN
INTRAVENOUS | Status: AC
  Filled 2015-08-04: qty 100

## 2015-08-04 MED ORDER — VALACYCLOVIR HCL 500 MG PO TABS: 1000.0000 mg | ORAL_TABLET | Freq: Two times a day (BID) | ORAL | Status: DC | PRN

## 2015-08-04 MED ORDER — ONDANSETRON HCL 4 MG PO TABS: 4.0000 mg | ORAL_TABLET | Freq: Four times a day (QID) | ORAL | Status: DC | PRN

## 2015-08-04 MED ORDER — BUPIVACAINE-EPINEPHRINE (PF) 0.25% -1:200000 IJ SOLN
INTRAMUSCULAR | Status: AC
  Filled 2015-08-04: qty 30

## 2015-08-04 MED ORDER — CEFAZOLIN SODIUM-DEXTROSE 2-4 GM/100ML-% IV SOLN
2.0000 g | INTRAVENOUS | Status: AC
  Administered 2015-08-04: 2 g via INTRAVENOUS

## 2015-08-04 MED ORDER — ROSUVASTATIN CALCIUM 10 MG PO TABS
10.0000 mg | ORAL_TABLET | Freq: Every day | ORAL | Status: DC
  Administered 2015-08-04: 10 mg via ORAL
  Filled 2015-08-04 (×2): qty 1

## 2015-08-04 MED ORDER — ANASTROZOLE 1 MG PO TABS
1.0000 mg | ORAL_TABLET | Freq: Every day | ORAL | Status: DC
  Administered 2015-08-05: 1 mg via ORAL
  Filled 2015-08-04: qty 1

## 2015-08-04 MED ORDER — TRANEXAMIC ACID 1000 MG/10ML IV SOLN
1000.0000 mg | Freq: Once | INTRAVENOUS | Status: AC
  Administered 2015-08-04: 1000 mg via INTRAVENOUS
  Filled 2015-08-04: qty 10

## 2015-08-04 MED ORDER — HYDROMORPHONE HCL 1 MG/ML IJ SOLN: 0.2500 mg | INTRAMUSCULAR | Status: DC | PRN

## 2015-08-04 MED ORDER — PHENOL 1.4 % MT LIQD: 1.0000 | OROMUCOSAL | Status: DC | PRN

## 2015-08-04 MED ORDER — PHENYLEPHRINE HCL 10 MG/ML IJ SOLN
INTRAMUSCULAR | Status: DC | PRN
  Administered 2015-08-04 (×6): 80 ug via INTRAVENOUS

## 2015-08-04 MED ORDER — ONDANSETRON HCL 4 MG/2ML IJ SOLN: 4.0000 mg | Freq: Four times a day (QID) | INTRAMUSCULAR | Status: DC | PRN

## 2015-08-04 MED ORDER — SODIUM CHLORIDE 0.9 % IJ SOLN
INTRAMUSCULAR | Status: AC
  Filled 2015-08-04: qty 50

## 2015-08-04 MED ORDER — PROPOFOL 10 MG/ML IV BOLUS
INTRAVENOUS | Status: AC
  Filled 2015-08-04: qty 20

## 2015-08-04 MED ORDER — FERROUS SULFATE 325 (65 FE) MG PO TABS
325.0000 mg | ORAL_TABLET | Freq: Three times a day (TID) | ORAL | Status: DC
  Administered 2015-08-05: 325 mg via ORAL
  Filled 2015-08-04 (×5): qty 1

## 2015-08-04 MED ORDER — LACTATED RINGERS IV SOLN
INTRAVENOUS | Status: DC
  Administered 2015-08-04: 1000 mL via INTRAVENOUS
  Administered 2015-08-04: 14:00:00 via INTRAVENOUS

## 2015-08-04 MED ORDER — KETOROLAC TROMETHAMINE 30 MG/ML IJ SOLN
INTRAMUSCULAR | Status: AC
  Filled 2015-08-04: qty 1

## 2015-08-04 MED ORDER — CELECOXIB 200 MG PO CAPS
200.0000 mg | ORAL_CAPSULE | Freq: Two times a day (BID) | ORAL | Status: DC
  Administered 2015-08-04 – 2015-08-05 (×2): 200 mg via ORAL
  Filled 2015-08-04 (×3): qty 1

## 2015-08-04 MED ORDER — FENTANYL CITRATE (PF) 250 MCG/5ML IJ SOLN
INTRAMUSCULAR | Status: DC | PRN
  Administered 2015-08-04 (×2): 50 ug via INTRAVENOUS

## 2015-08-04 MED ORDER — BUPIVACAINE-EPINEPHRINE (PF) 0.25% -1:200000 IJ SOLN
INTRAMUSCULAR | Status: DC | PRN
  Administered 2015-08-04: 30 mL

## 2015-08-04 MED ORDER — PHENYLEPHRINE 40 MCG/ML (10ML) SYRINGE FOR IV PUSH (FOR BLOOD PRESSURE SUPPORT)
PREFILLED_SYRINGE | INTRAVENOUS | Status: AC
  Filled 2015-08-04: qty 10

## 2015-08-04 MED ORDER — CHLORHEXIDINE GLUCONATE 4 % EX LIQD: 60.0000 mL | Freq: Once | CUTANEOUS | Status: DC

## 2015-08-04 MED ORDER — SODIUM CHLORIDE 0.9 % IJ SOLN
INTRAMUSCULAR | Status: DC | PRN
  Administered 2015-08-04: 30 mL

## 2015-08-04 MED ORDER — HYDROMORPHONE HCL 1 MG/ML IJ SOLN
0.5000 mg | INTRAMUSCULAR | Status: DC | PRN
  Administered 2015-08-04 – 2015-08-05 (×3): 1 mg via INTRAVENOUS
  Filled 2015-08-04 (×3): qty 1

## 2015-08-04 MED ORDER — BISACODYL 10 MG RE SUPP: 10.0000 mg | Freq: Every day | RECTAL | Status: DC | PRN

## 2015-08-04 MED ORDER — KETOROLAC TROMETHAMINE 30 MG/ML IJ SOLN
INTRAMUSCULAR | Status: DC | PRN
  Administered 2015-08-04: 30 mg

## 2015-08-04 MED ORDER — VENLAFAXINE HCL ER 37.5 MG PO CP24
37.5000 mg | ORAL_CAPSULE | Freq: Every day | ORAL | Status: DC
  Administered 2015-08-05: 37.5 mg via ORAL
  Filled 2015-08-04 (×2): qty 1

## 2015-08-04 MED ORDER — DEXAMETHASONE SODIUM PHOSPHATE 10 MG/ML IJ SOLN
10.0000 mg | Freq: Once | INTRAMUSCULAR | Status: AC
  Administered 2015-08-04: 10 mg via INTRAVENOUS

## 2015-08-04 MED ORDER — EZETIMIBE 10 MG PO TABS
10.0000 mg | ORAL_TABLET | Freq: Every day | ORAL | Status: DC
  Administered 2015-08-04 – 2015-08-05 (×2): 10 mg via ORAL
  Filled 2015-08-04 (×2): qty 1

## 2015-08-04 MED ORDER — DEXAMETHASONE SODIUM PHOSPHATE 10 MG/ML IJ SOLN
INTRAMUSCULAR | Status: AC
  Filled 2015-08-04: qty 1

## 2015-08-04 MED ORDER — MAGNESIUM CITRATE PO SOLN: 1.0000 | Freq: Once | ORAL | Status: DC | PRN

## 2015-08-04 MED ORDER — SCOPOLAMINE 1 MG/3DAYS TD PT72
MEDICATED_PATCH | TRANSDERMAL | Status: AC
  Filled 2015-08-04: qty 1

## 2015-08-04 MED ORDER — DIPHENHYDRAMINE HCL 25 MG PO CAPS: 25.0000 mg | ORAL_CAPSULE | Freq: Four times a day (QID) | ORAL | Status: DC | PRN

## 2015-08-04 MED ORDER — HYDROCHLOROTHIAZIDE 12.5 MG PO CAPS
12.5000 mg | ORAL_CAPSULE | Freq: Every day | ORAL | Status: DC
  Administered 2015-08-04 – 2015-08-05 (×2): 12.5 mg via ORAL
  Filled 2015-08-04 (×2): qty 1

## 2015-08-04 MED ORDER — PROPOFOL 10 MG/ML IV BOLUS
INTRAVENOUS | Status: AC
  Filled 2015-08-04: qty 40

## 2015-08-04 MED ORDER — HYDROCODONE-ACETAMINOPHEN 7.5-325 MG PO TABS
1.0000 | ORAL_TABLET | ORAL | Status: DC
  Administered 2015-08-04: 2 via ORAL
  Administered 2015-08-04: 1 via ORAL
  Administered 2015-08-05 (×3): 2 via ORAL
  Filled 2015-08-04 (×3): qty 2
  Filled 2015-08-04: qty 1
  Filled 2015-08-04: qty 2

## 2015-08-04 MED ORDER — MIDAZOLAM HCL 2 MG/2ML IJ SOLN
INTRAMUSCULAR | Status: AC
  Filled 2015-08-04: qty 2

## 2015-08-04 MED ORDER — POLYETHYLENE GLYCOL 3350 17 G PO PACK
17.0000 g | PACK | Freq: Two times a day (BID) | ORAL | Status: DC
  Administered 2015-08-04 – 2015-08-05 (×2): 17 g via ORAL

## 2015-08-04 MED ORDER — MIDAZOLAM HCL 5 MG/5ML IJ SOLN
INTRAMUSCULAR | Status: DC | PRN
  Administered 2015-08-04: 2 mg via INTRAVENOUS

## 2015-08-04 MED ORDER — FENTANYL CITRATE (PF) 250 MCG/5ML IJ SOLN
INTRAMUSCULAR | Status: AC
  Filled 2015-08-04: qty 5

## 2015-08-04 MED ORDER — METHOCARBAMOL 500 MG PO TABS
500.0000 mg | ORAL_TABLET | Freq: Four times a day (QID) | ORAL | Status: DC | PRN
  Administered 2015-08-04: 500 mg via ORAL
  Filled 2015-08-04: qty 1

## 2015-08-04 MED ORDER — METOCLOPRAMIDE HCL 10 MG PO TABS: 5.0000 mg | ORAL_TABLET | Freq: Three times a day (TID) | ORAL | Status: DC | PRN

## 2015-08-04 MED ORDER — SCOPOLAMINE 1 MG/3DAYS TD PT72
1.0000 | MEDICATED_PATCH | TRANSDERMAL | Status: DC
  Administered 2015-08-04: 1 via TRANSDERMAL

## 2015-08-04 MED ORDER — DOCUSATE SODIUM 100 MG PO CAPS
100.0000 mg | ORAL_CAPSULE | Freq: Two times a day (BID) | ORAL | Status: DC
  Administered 2015-08-04 – 2015-08-05 (×2): 100 mg via ORAL

## 2015-08-04 MED ORDER — ALUM & MAG HYDROXIDE-SIMETH 200-200-20 MG/5ML PO SUSP: 30.0000 mL | ORAL | Status: DC | PRN

## 2015-08-04 MED ORDER — ONDANSETRON HCL 4 MG/2ML IJ SOLN
INTRAMUSCULAR | Status: DC | PRN
  Administered 2015-08-04: 4 mg via INTRAVENOUS

## 2015-08-04 MED ORDER — SODIUM CHLORIDE 0.9 % IR SOLN
Status: DC | PRN
  Administered 2015-08-04: 1000 mL

## 2015-08-04 MED ORDER — BUPIVACAINE IN DEXTROSE 0.75-8.25 % IT SOLN
INTRATHECAL | Status: DC | PRN
  Administered 2015-08-04: 2 mL via INTRATHECAL

## 2015-08-04 MED ORDER — SODIUM CHLORIDE 0.9 % IV SOLN
INTRAVENOUS | Status: DC
  Administered 2015-08-04: 19:00:00 via INTRAVENOUS
  Filled 2015-08-04 (×5): qty 1000

## 2015-08-04 MED ORDER — TELMISARTAN-HCTZ 40-12.5 MG PO TABS
1.0000 | ORAL_TABLET | Freq: Every day | ORAL | Status: DC
Start: 2015-08-04 — End: 2015-08-04

## 2015-08-04 MED ORDER — STERILE WATER FOR IRRIGATION IR SOLN
Status: DC | PRN
  Administered 2015-08-04: 2000 mL

## 2015-08-04 MED ORDER — EPHEDRINE SULFATE 50 MG/ML IJ SOLN
INTRAMUSCULAR | Status: DC | PRN
  Administered 2015-08-04 (×2): 10 mg via INTRAVENOUS
  Administered 2015-08-04 (×2): 15 mg via INTRAVENOUS

## 2015-08-04 MED ORDER — PROPOFOL 500 MG/50ML IV EMUL
INTRAVENOUS | Status: DC | PRN
  Administered 2015-08-04: 100 ug/kg/min via INTRAVENOUS

## 2015-08-04 MED ORDER — ASPIRIN EC 325 MG PO TBEC
325.0000 mg | DELAYED_RELEASE_TABLET | Freq: Two times a day (BID) | ORAL | Status: DC
  Administered 2015-08-05: 325 mg via ORAL
  Filled 2015-08-04 (×3): qty 1

## 2015-08-04 MED ORDER — 0.9 % SODIUM CHLORIDE (POUR BTL) OPTIME
TOPICAL | Status: DC | PRN
  Administered 2015-08-04: 1000 mL

## 2015-08-04 MED ORDER — IRBESARTAN 150 MG PO TABS
150.0000 mg | ORAL_TABLET | Freq: Every day | ORAL | Status: DC
Start: 2015-08-04 — End: 2015-08-05
  Administered 2015-08-04 – 2015-08-05 (×2): 150 mg via ORAL
  Filled 2015-08-04 (×2): qty 1

## 2015-08-04 MED ORDER — CEFAZOLIN SODIUM-DEXTROSE 2-4 GM/100ML-% IV SOLN
2.0000 g | Freq: Four times a day (QID) | INTRAVENOUS | Status: AC
  Administered 2015-08-04 – 2015-08-05 (×2): 2 g via INTRAVENOUS
  Filled 2015-08-04 (×2): qty 100

## 2015-08-04 MED ORDER — DEXAMETHASONE SODIUM PHOSPHATE 10 MG/ML IJ SOLN
10.0000 mg | Freq: Once | INTRAMUSCULAR | Status: AC
  Administered 2015-08-05: 10 mg via INTRAVENOUS
  Filled 2015-08-04: qty 1

## 2015-08-04 MED ORDER — METOCLOPRAMIDE HCL 5 MG/ML IJ SOLN: 5.0000 mg | Freq: Three times a day (TID) | INTRAMUSCULAR | Status: DC | PRN

## 2015-08-04 SURGICAL SUPPLY — 41 items
BANDAGE ACE 6X5 VEL STRL LF (GAUZE/BANDAGES/DRESSINGS) ×3 IMPLANT
BLADE SAW SGTL 13.0X1.19X90.0M (BLADE) ×3 IMPLANT
BOWL SMART MIX CTS (DISPOSABLE) ×3 IMPLANT
CAPT KNEE TOTAL 3 ATTUNE ×2 IMPLANT
CEMENT HV SMART SET (Cement) ×4 IMPLANT
CLOTH BEACON ORANGE TIMEOUT ST (SAFETY) ×3 IMPLANT
CUFF TOURN SGL QUICK 34 (TOURNIQUET CUFF) ×3
CUFF TRNQT CYL 34X4X40X1 (TOURNIQUET CUFF) ×1 IMPLANT
DECANTER SPIKE VIAL GLASS SM (MISCELLANEOUS) ×3 IMPLANT
DRAPE U-SHAPE 47X51 STRL (DRAPES) ×3 IMPLANT
DRSG AQUACEL AG ADV 3.5X10 (GAUZE/BANDAGES/DRESSINGS) ×3 IMPLANT
DURAPREP 26ML APPLICATOR (WOUND CARE) ×6 IMPLANT
ELECT REM PT RETURN 9FT ADLT (ELECTROSURGICAL) ×3
ELECTRODE REM PT RTRN 9FT ADLT (ELECTROSURGICAL) ×1 IMPLANT
GLOVE BIOGEL PI IND STRL 7.5 (GLOVE) ×1 IMPLANT
GLOVE BIOGEL PI IND STRL 8.5 (GLOVE) ×1 IMPLANT
GLOVE BIOGEL PI INDICATOR 7.5 (GLOVE) ×10
GLOVE BIOGEL PI INDICATOR 8.5 (GLOVE) ×4
GLOVE ECLIPSE 8.0 STRL XLNG CF (GLOVE) ×5 IMPLANT
GLOVE ORTHO TXT STRL SZ7.5 (GLOVE) ×6 IMPLANT
GLOVE SURG SS PI 7.0 STRL IVOR (GLOVE) ×4 IMPLANT
GLOVE SURG SS PI 7.5 STRL IVOR (GLOVE) ×2 IMPLANT
GOWN STRL REUS W/TWL LRG LVL3 (GOWN DISPOSABLE) ×3 IMPLANT
GOWN STRL REUS W/TWL XL LVL3 (GOWN DISPOSABLE) ×7 IMPLANT
HANDPIECE INTERPULSE COAX TIP (DISPOSABLE) ×3
LIQUID BAND (GAUZE/BANDAGES/DRESSINGS) ×3 IMPLANT
MANIFOLD NEPTUNE II (INSTRUMENTS) ×3 IMPLANT
PACK TOTAL KNEE CUSTOM (KITS) ×3 IMPLANT
POSITIONER SURGICAL ARM (MISCELLANEOUS) ×3 IMPLANT
SET HNDPC FAN SPRY TIP SCT (DISPOSABLE) ×1 IMPLANT
SET PAD KNEE POSITIONER (MISCELLANEOUS) ×3 IMPLANT
SUCTION FRAZIER HANDLE 12FR (TUBING) ×2
SUCTION TUBE FRAZIER 12FR DISP (TUBING) ×1 IMPLANT
SUT MNCRL AB 4-0 PS2 18 (SUTURE) ×3 IMPLANT
SUT VIC AB 1 CT1 36 (SUTURE) ×3 IMPLANT
SUT VIC AB 2-0 CT1 27 (SUTURE) ×9
SUT VIC AB 2-0 CT1 TAPERPNT 27 (SUTURE) ×3 IMPLANT
SUT VLOC 180 0 24IN GS25 (SUTURE) ×3 IMPLANT
TRAY FOLEY W/METER SILVER 14FR (SET/KITS/TRAYS/PACK) ×3 IMPLANT
WRAP KNEE MAXI GEL POST OP (GAUZE/BANDAGES/DRESSINGS) ×3 IMPLANT
YANKAUER SUCT BULB TIP 10FT TU (MISCELLANEOUS) ×3 IMPLANT

## 2015-08-04 NOTE — Interval H&P Note (Deleted)
History and Physical Interval Note:  08/04/2015 12:00 PM  Tammy Cooper  has presented today for surgery, with the diagnosis of left knee osteoarthritis  The various methods of treatment have been discussed with the patient and family. After consideration of risks, benefits and other options for treatment, the patient has consented to  Procedure(s): LEFT TOTAL KNEE ARTHROPLASTY (Left) as a surgical intervention .  The patient's history has been reviewed, patient examined, no change in status, stable for surgery.  I have reviewed the patient's chart and labs.  Questions were answered to the patient's satisfaction.     Mauri Pole

## 2015-08-04 NOTE — Anesthesia Procedure Notes (Signed)
Spinal Patient location during procedure: OR Start time: 08/04/2015 1:25 PM End time: 08/04/2015 1:36 PM Staffing Anesthesiologist: Duane Boston Performed by: anesthesiologist  Preanesthetic Checklist Completed: patient identified, surgical consent, pre-op evaluation, timeout performed, IV checked, risks and benefits discussed and monitors and equipment checked Spinal Block Patient position: sitting Prep: DuraPrep Patient monitoring: cardiac monitor, continuous pulse ox and blood pressure Approach: midline Location: L2-3 Injection technique: single-shot Needle Needle type: Pencan  Needle gauge: 24 G Needle length: 9 cm Additional Notes Functioning IV was confirmed and monitors were applied. Sterile prep and drape, including hand hygiene and sterile gloves were used. The patient was positioned and the spine was prepped. The skin was anesthetized with lidocaine.  Free flow of clear CSF was obtained prior to injecting local anesthetic into the CSF.  The spinal needle aspirated freely following injection.  The needle was carefully withdrawn.  The patient tolerated the procedure well.

## 2015-08-04 NOTE — Transfer of Care (Signed)
Immediate Anesthesia Transfer of Care Note  Patient: Tammy Cooper  Procedure(s) Performed: Procedure(s): RIGHT TOTAL KNEE ARTHROPLASTY (Right)  Patient Location: PACU  Anesthesia Type:MAC and Spinal  Level of Consciousness: awake, alert  and oriented  Airway & Oxygen Therapy: Patient Spontanous Breathing and Patient connected to face mask oxygen  Post-op Assessment: Report given to RN and Post -op Vital signs reviewed and stable  Post vital signs: Reviewed and stable  Last Vitals:  Filed Vitals:   08/04/15 1115  BP: 149/49  Pulse: 68  Temp: 36.7 C  Resp: 16    Complications: No apparent anesthesia complications

## 2015-08-04 NOTE — Interval H&P Note (Signed)
History and Physical Interval Note:  08/04/2015 1:19 PM  Tammy Cooper  has presented today for surgery, with the diagnosis of right knee osteoarthritis  The various methods of treatment have been discussed with the patient and family. After consideration of risks, benefits and other options for treatment, the patient has consented to  Procedure(s): RIGHT TOTAL KNEE ARTHROPLASTY (Right) as a surgical intervention .  The patient's history has been reviewed, patient examined, no change in status, stable for surgery.  I have reviewed the patient's chart and labs.  Questions were answered to the patient's satisfaction.     Mauri Pole

## 2015-08-04 NOTE — Interval H&P Note (Deleted)
History and Physical Interval Note:  08/04/2015 1:14 PM  Tammy Cooper  has presented today for surgery, with the diagnosis of left knee osteoarthritis  The various methods of treatment have been discussed with the patient and family. After consideration of risks, benefits and other options for treatment, the patient has consented to  Procedure(s): RIGHT TOTAL KNEE ARTHROPLASTY (Right) as a surgical intervention .  The patient's history has been reviewed, patient examined, no change in status, stable for surgery.  I have reviewed the patient's chart and labs.  Questions were answered to the patient's satisfaction.     Mauri Pole

## 2015-08-04 NOTE — Op Note (Signed)
NAME:  Tammy Cooper [facility]                      MEDICAL RECORD NO.:  [id number]                             FACILITY:  [facility]      PHYSICIAN:  Pietro Cassis. Alvan Dame, M.D.  DATE OF BIRTH:  1950/12/05      DATE OF PROCEDURE:  08/04/2015                                     OPERATIVE REPORT         PREOPERATIVE DIAGNOSIS:  Right knee osteoarthritis.      POSTOPERATIVE DIAGNOSIS:  Right knee osteoarthritis.      FINDINGS:  The patient was noted to have complete loss of cartilage and   bone-on-bone arthritis with associated osteophytes in all three compartments of   the knee with a significant synovitis and associated effusion.      PROCEDURE:  Right total knee replacement.      COMPONENTS USED:  DePuy Attune rotating platform posterior stabilized knee   system, a size 5N femur, 4 tibia, size 6 mm PS AOX insert, and 35 anatomic patellar   button.      SURGEON:  Pietro Cassis. Alvan Dame, M.D.      ASSISTANT:  Danae Orleans, PA-C.      ANESTHESIA:  Spinal.      SPECIMENS:  None.      COMPLICATION:  None.      DRAINS:  None  EBL: <100cc      TOURNIQUET TIME:   Total Tourniquet Time Documented: Thigh (Right) - 31 minutes Total: Thigh (Right) - 31 minutes  .      The patient was stable to the recovery room.      INDICATION FOR PROCEDURE:  FENET GUIDETTI is a 65 y.o. female patient of   mine.  The patient had been seen, evaluated, and treated conservatively in the   office with medication, activity modification, and injections.  The patient had   radiographic changes of bone-on-bone arthritis with endplate sclerosis and osteophytes noted.      The patient failed conservative measures including medication, injections, and activity modification, and at this point was ready for more definitive measures.   Based on the radiographic changes and failed conservative measures, the patient   decided to proceed with total knee replacement.  Risks of infection,   DVT, component  failure, need for revision surgery, postop course, and   expectations were all   discussed and reviewed.  Consent was obtained for benefit of pain   relief.      PROCEDURE IN DETAIL:  The patient was brought to the operative theater.   Once adequate anesthesia, preoperative antibiotics, 2 gm of Ancef, 1 gm of Tranexamic Acid, and 10 mg of Decadron administered, the patient was positioned supine with the right thigh tourniquet placed.  The  right lower extremity was prepped and draped in sterile fashion.  A time-   out was performed identifying the patient, planned procedure, and   extremity.      The right lower extremity was placed in the [facility] leg holder.  The leg was   exsanguinated, tourniquet elevated to 250 mmHg.  A midline incision was   made followed by  median parapatellar arthrotomy.  Following initial   exposure, attention was first directed to the patella.  Precut   measurement was noted to be 21 mm.  I resected down to 14 mm and used a   35 anatomic patellar button to restore patellar height as well as cover the cut   surface.      The lug holes were drilled and a metal shim was placed to protect the   patella from retractors and saw blades.      At this point, attention was now directed to the femur.  The femoral   canal was opened with a drill, irrigated to try to prevent fat emboli.  An   intramedullary rod was passed at 3 degrees valgus, 9 mm of bone was   resected off the distal femur.  Following this resection, the tibia was   subluxated anteriorly.  Using the extramedullary guide, 2 mm of bone was resected off   the proximal lateral tibia.  We confirmed the gap would be   stable medially and laterally with a size 5 mm insert as well as confirmed   the cut was perpendicular in the coronal plane, checking with an alignment rod.      Once this was done, I sized the femur to be a size 5 in the anterior-   posterior dimension, chose a narrow component based on medial  and   lateral dimension.  The size 5 rotation block was then pinned in   position anterior referenced using the C-clamp to set rotation.  The   anterior, posterior, and  chamfer cuts were made without difficulty nor   notching making certain that I was along the anterior cortex to help   with flexion gap stability.      The final box cut was made off the lateral aspect of distal femur.      At this point, the tibia was sized to be a size 4, the size 4 tray was   then pinned in position through the medial third of the tubercle,   drilled, and keel punched.  Trial reduction was now carried with a 5 femur,  4 tibia, a size 6 then 7 mm insert, and the 35 anatomic patella botton.  The knee was brought to   extension, full extension with good flexion stability with the patella   tracking through the trochlea without application of pressure.  Given   all these findings the femoral lug holes were drilled the trial components removed.  Final components were   opened and cement was mixed.  The knee was irrigated with normal saline   solution and pulse lavage.  The synovial lining was   then injected with 30 cc of 0.25% Marcaine with epinephrine and 1 cc of Toradol plus 30 cc of NS for a   total of 61 cc.      The knee was irrigated.  Final implants were then cemented onto clean and   dried cut surfaces of bone with the knee brought to extension with a size 7 mm trial insert.      Once the cement had fully cured, the excess cement was removed   throughout the knee.  I confirmed I was satisfied with the range of   motion and stability, and the final size 7 mm PS AOX insert was chosen.  It was   placed into the knee.      The tourniquet had been let down at 31 minutes.  No significant   hemostasis required.  The   extensor mechanism was then reapproximated using #1 Vicryl and #0 V-lock sutureswith the knee   in flexion.  The   remaining wound was closed with 2-0 Vicryl and running 4-0 Monocryl.    The knee was cleaned, dried, dressed sterilely using Dermabond and   Aquacel dressing.  The patient was then   brought to recovery room in stable condition, tolerating the procedure   well.   Please note that Physician Assistant, Danae Orleans, PA-C, was present for the entirety of the case, and was utilized for pre-operative positioning, peri-operative retractor management, general facilitation of the procedure.  He was also utilized for primary wound closure at the end of the case.              Pietro Cassis Alvan Dame, M.D.    08/04/2015 3:02 PM

## 2015-08-04 NOTE — Anesthesia Postprocedure Evaluation (Signed)
Anesthesia Post Note  Patient: Tammy Cooper  Procedure(s) Performed: Procedure(s) (LRB): RIGHT TOTAL KNEE ARTHROPLASTY (Right)  Patient location during evaluation: PACU Anesthesia Type: Spinal and MAC Level of consciousness: awake and alert Pain management: pain level controlled Vital Signs Assessment: post-procedure vital signs reviewed and stable Respiratory status: spontaneous breathing and respiratory function stable Cardiovascular status: blood pressure returned to baseline and stable Postop Assessment: spinal receding Anesthetic complications: no    Last Vitals:  Filed Vitals:   08/04/15 1600 08/04/15 1615  BP: 107/64 122/53  Pulse: 71 69  Temp:    Resp: 20 12    Last Pain:  Filed Vitals:   08/04/15 1616  PainSc: 0-No pain                 Adena Sima DANIEL

## 2015-08-04 NOTE — Anesthesia Preprocedure Evaluation (Signed)
Anesthesia Evaluation  Patient identified by MRN, date of birth, ID band Patient awake    Reviewed: Allergy & Precautions, NPO status , Patient's Chart, lab work & pertinent test results, reviewed documented beta blocker date and time   History of Anesthesia Complications (+) PONVNegative for: history of anesthetic complications  Airway Mallampati: II  TM Distance: <3 FB Neck ROM: Full    Dental  (+) Teeth Intact, Dental Advisory Given   Pulmonary neg pulmonary ROS,    breath sounds clear to auscultation       Cardiovascular hypertension, Pt. on medications (-) angina(-) Past MI and (-) CHF  Rhythm:Regular     Neuro/Psych PSYCHIATRIC DISORDERS Anxiety Depression TIA   GI/Hepatic negative GI ROS,   Endo/Other  Morbid obesity  Renal/GU negative Renal ROS     Musculoskeletal  (+) Arthritis ,   Abdominal   Peds  Hematology negative hematology ROS (+)   Anesthesia Other Findings   Reproductive/Obstetrics                             Anesthesia Physical  Anesthesia Plan  ASA: III  Anesthesia Plan: MAC and Spinal   Post-op Pain Management:    Induction: Intravenous  Airway Management Planned: Simple Face Mask  Additional Equipment:   Intra-op Plan:   Post-operative Plan:   Informed Consent:   Plan Discussed with: CRNA and Surgeon  Anesthesia Plan Comments:         Anesthesia Quick Evaluation

## 2015-08-05 ENCOUNTER — Encounter (HOSPITAL_COMMUNITY): Payer: Self-pay | Admitting: Orthopedic Surgery

## 2015-08-05 DIAGNOSIS — E669 Obesity, unspecified: Secondary | ICD-10-CM | POA: Diagnosis present

## 2015-08-05 LAB — BASIC METABOLIC PANEL
Anion gap: 9 (ref 5–15)
BUN: 14 mg/dL (ref 6–20)
CHLORIDE: 102 mmol/L (ref 101–111)
CO2: 26 mmol/L (ref 22–32)
CREATININE: 0.76 mg/dL (ref 0.44–1.00)
Calcium: 8.9 mg/dL (ref 8.9–10.3)
GFR calc Af Amer: >60 mL/min (ref >60)
GFR calc non Af Amer: >60 mL/min (ref >60)
Glucose, Bld: 185 mg/dL — ABNORMAL HIGH (ref 65–99)
Potassium: 4.5 mmol/L (ref 3.5–5.1)
SODIUM: 137 mmol/L (ref 135–145)

## 2015-08-05 LAB — CBC
HCT: 32.5 % — ABNORMAL LOW (ref 36.0–46.0)
HEMOGLOBIN: 10.8 g/dL — AB (ref 12.0–15.0)
MCH: 29.4 pg (ref 26.0–34.0)
MCHC: 33.2 g/dL (ref 30.0–36.0)
MCV: 88.6 fL (ref 78.0–100.0)
Platelets: 269 10*3/uL (ref 150–400)
RBC: 3.67 MIL/uL — ABNORMAL LOW (ref 3.87–5.11)
RDW: 13.2 % (ref 11.5–15.5)
WBC: 10.2 10*3/uL (ref 4.0–10.5)

## 2015-08-05 LAB — GLUCOSE, CAPILLARY: GLUCOSE-CAPILLARY: 148 mg/dL — AB (ref 65–99)

## 2015-08-05 MED ORDER — DOCUSATE SODIUM 100 MG PO CAPS: 100.0000 mg | ORAL_CAPSULE | Freq: Two times a day (BID) | ORAL | Status: DC

## 2015-08-05 MED ORDER — TIZANIDINE HCL 4 MG PO TABS: 4.0000 mg | ORAL_TABLET | Freq: Four times a day (QID) | ORAL | Status: DC | PRN

## 2015-08-05 MED ORDER — HYDROCODONE-ACETAMINOPHEN 7.5-325 MG PO TABS: 1.0000 | ORAL_TABLET | ORAL | Status: DC | PRN

## 2015-08-05 MED ORDER — ASPIRIN 325 MG PO TBEC: 325.0000 mg | DELAYED_RELEASE_TABLET | Freq: Two times a day (BID) | ORAL | Status: AC

## 2015-08-05 MED ORDER — CELECOXIB 200 MG PO CAPS: 200.0000 mg | ORAL_CAPSULE | Freq: Two times a day (BID) | ORAL | Status: DC

## 2015-08-05 MED ORDER — FERROUS SULFATE 325 (65 FE) MG PO TABS: 325.0000 mg | ORAL_TABLET | Freq: Three times a day (TID) | ORAL | Status: DC

## 2015-08-05 MED ORDER — POLYETHYLENE GLYCOL 3350 17 G PO PACK: 17.0000 g | PACK | Freq: Two times a day (BID) | ORAL | Status: DC

## 2015-08-05 NOTE — Progress Notes (Signed)
Physical Therapy Treatment Patient Details Name: Tammy Cooper MRN: [id number] DOB: 14-Sep-1950 Today's Date: 08/05/2015    History of Present Illness s/p R TKA    PT Comments    Pt progressing well and eager for dc home.  Reviewed stairs with pt and spouse with written instructions provided.  Follow Up Recommendations  Home health PT     Equipment Recommendations  Rolling walker with 5" wheels    Recommendations for Other Services OT consult     Precautions / Restrictions Precautions Precautions: Knee Restrictions Weight Bearing Restrictions: No Other Position/Activity Restrictions: WBAT    Mobility  Bed Mobility Overal bed mobility: Needs Assistance Bed Mobility: Supine to Sit;Sit to Supine     Supine to sit: Supervision Sit to supine: Supervision   General bed mobility comments: cues for sequence and use of L LE to self assist`  Transfers Overall transfer level: Needs assistance Equipment used: Rolling walker (2 wheeled) Transfers: Sit to/from Stand Sit to Stand: Supervision         General transfer comment: min cues for UE/LE placement  Ambulation/Gait Ambulation/Gait assistance: Min guard;Supervision Ambulation Distance (Feet): 70 Feet Assistive device: Rolling walker (2 wheeled) Gait Pattern/deviations: Step-to pattern;Decreased step length - right;Decreased step length - left;Shuffle Gait velocity: decr Gait velocity interpretation: Below normal speed for age/gender General Gait Details: cues for sequence, posture and position from RW   Stairs Stairs: Yes Stairs assistance: Min assist Stair Management: No rails;One rail Right;Step to pattern;Backwards;Forwards;With walker;With crutches Number of Stairs: 9 General stair comments: 2 steps twice bkwd with RW and cues for sequence and foot/RW placement.  5 steps fwd with crutch, rail and cues for sequence and foot/crutch placement.  Written instructions provided.  Husband educated on his  arrival  Wheelchair Mobility    Modified Rankin (Stroke Patients Only)       Balance                                    Cognition Arousal/Alertness: Awake/alert Behavior During Therapy: WFL for tasks assessed/performed Overall Cognitive Status: Within Functional Limits for tasks assessed                      Exercises Total Joint Exercises Ankle Circles/Pumps: AROM;Both;15 reps;Supine Quad Sets: AROM;Both;10 reps;Supine Heel Slides: AAROM;Right;15 reps;Supine Straight Leg Raises: AAROM;AROM;Right;15 reps;Supine    General Comments        Pertinent Vitals/Pain Pain Assessment: 0-10 Pain Score: 6  Pain Location: R knee Pain Descriptors / Indicators: Aching;Sore Pain Intervention(s): Limited activity within patient's tolerance;Monitored during session;Premedicated before session;Ice applied    Home Living Family/patient expects to be discharged to:: Private residence Living Arrangements: Spouse/significant other Available Help at Discharge: Family Type of Home: House Home Access: Stairs to enter Entrance Stairs-Rails: None Home Layout: Two level Home Equipment: Bedside commode;Shower seat      Prior Function Level of Independence: Independent          PT Goals (current goals can now be found in the care plan section) Acute Rehab PT Goals Patient Stated Goal: home today PT Goal Formulation: With patient Time For Goal Achievement: 08/08/15 Potential to Achieve Goals: Good Progress towards PT goals: Progressing toward goals    Frequency  Min 6X/week    PT Plan Current plan remains appropriate    Co-evaluation             End of Session Equipment  Utilized During Treatment: Gait belt Activity Tolerance: Patient tolerated treatment well Patient left: in bed;with call bell/phone within reach;with family/visitor present     Time: [id number] PT Time Calculation (min) (ACUTE ONLY): 28 min  Charges:  $Gait Training: 8-22  mins $Therapeutic Exercise: 8-22 mins $Therapeutic Activity: 8-22 mins                    G Codes:      Satina Jerrell 15-Aug-2015, 3:37 PM

## 2015-08-05 NOTE — Care Management Note (Signed)
Case Management Note  Patient Details  Name: BREYANA FOLLANSBEE MRN: [id number] Date of Birth: [birth year]  Subjective/Objective:                  RIGHT TOTAL KNEE ARTHROPLASTY (Right) Action/Plan: Discharge planning Expected Discharge Date:  08/05/15               Expected Discharge Plan:  [place]  In-House Referral:     Discharge planning Services  CM Consult  Post Acute Care Choice:  Home Health Choice offered to:  Patient  DME Arranged:  Walker rolling DME Agency:  [place]:  PT [facility] Agency:  [place]  Status of Service:  Completed, signed off  Medicare Important Message Given:    Date Medicare IM Given:    Medicare IM give by:    Date Additional Medicare IM Given:    Additional Medicare Important Message give by:     If discussed at [place] of Stay Meetings, dates discussed:    Additional Comments: CM met with pt in room to offer choice of home health agency.  Pt chooses [facility] to render HHPT.  Referral called to [employer], Tim. CM called [facility] DME rep, Lecretia to please deliver the rolling walker to room prior to discharge.  No other CM needs were communicated. Dellie Catholic, RN 08/05/2015, 4:20 PM

## 2015-08-05 NOTE — Discharge Instructions (Signed)

## 2015-08-05 NOTE — Evaluation (Signed)
Occupational Therapy Evaluation Patient Details Name: Tammy Cooper MRN: [id number] DOB: 03-Dec-1950 Today's Date: 08/05/2015    History of Present Illness s/p R TKA   Clinical Impression   This 65 year old female was admitted for the above surgery. All education was completed. No further OT is needed at this time    Follow Up Recommendations  No OT follow up    Equipment Recommendations  None recommended by OT    Recommendations for Other Services       Precautions / Restrictions Precautions Precautions: Knee Restrictions Weight Bearing Restrictions: No (WBAT) Other Position/Activity Restrictions: WBAT      Mobility Bed Mobility               General bed mobility comments: min A for sit to supine, assist for RLE  Transfers Overall transfer level: Needs assistance Equipment used: Rolling walker (2 wheeled) Transfers: Sit to/from Stand Sit to Stand: Min guard         General transfer comment: cues for UE/LE placement    Balance                                            ADL Overall ADL's : Needs assistance/impaired     Grooming: Wash/dry hands;Supervision/safety;Standing                   Toilet Transfer: Min guard;Ambulation;BSC;RW   Toileting- Clothing Manipulation and Hygiene: Supervision/safety;Sitting/lateral lean   Tub/ Shower Transfer: Walk-in shower;Min guard;Ambulation     General ADL Comments: pt needs min A for LB adls.  Husband will assist as needed.  She can perform UB with set up.  Educated on knee precautions     Vision     Perception     Praxis      Pertinent Vitals/Pain Pain Assessment: 0-10 Pain Score: 5  Pain Location: R knee Pain Descriptors / Indicators: Aching Pain Intervention(s): Limited activity within patient's tolerance;Monitored during session;Premedicated before session;Repositioned;Ice applied     Hand Dominance     Extremity/Trunk Assessment Upper Extremity  Assessment Upper Extremity Assessment: Overall WFL for tasks assessed           Communication Communication Communication: No difficulties   Cognition Arousal/Alertness: Awake/alert Behavior During Therapy: WFL for tasks assessed/performed Overall Cognitive Status: Within Functional Limits for tasks assessed                     General Comments       Exercises       Shoulder Instructions      Home Living Family/patient expects to be discharged to:: Private residence Living Arrangements: Spouse/significant other                 Bathroom Shower/Tub: Occupational psychologist: Standard     Home Equipment: Bedside commode;Shower seat          Prior Functioning/Environment Level of Independence: Independent             OT Diagnosis: Acute pain   OT Problem List:     OT Treatment/Interventions:      OT Goals(Current goals can be found in the care plan section) Acute Rehab OT Goals Patient Stated Goal: home today OT Goal Formulation: All assessment and education complete, DC therapy  OT Frequency:     Barriers to D/C:  Co-evaluation              End of Session    Activity Tolerance: Patient tolerated treatment well Patient left: in bed;with call bell/phone within reach;with bed alarm set   Time: [id number] OT Time Calculation (min): 19 min Charges:  OT General Charges $OT Visit: 1 Procedure OT Evaluation $OT Eval Low Complexity: 1 Procedure G-Codes:    Olliver Boyadjian 08/13/2015, 9:16 AM  Lesle Chris, OTR/L [phone] Aug 13, 2015

## 2015-08-05 NOTE — Progress Notes (Signed)
Pt's foley discontinued this morning. Pt verbalized understanding to note staff upon voiding since foley removal. Day shift staff made aware.

## 2015-08-05 NOTE — Progress Notes (Signed)
Physical Therapy Evaluation Patient Details Name: Tammy Cooper MRN: [id number] DOB: 05/05/1950 Today's Date: 08/05/2015   History of Present Illness  s/p R TKA  Clinical Impression  Pt s/p R TKR presents with decreased R LE strength/ROM and post op pain limiting functional mobility.  Pt should progress to dc home with family assist and follow up HHPT.    Follow Up Recommendations Home health PT    Equipment Recommendations  Rolling walker with 5" wheels    Recommendations for Other Services OT consult     Precautions / Restrictions Precautions Precautions: Knee Restrictions Weight Bearing Restrictions: No Other Position/Activity Restrictions: WBAT      Mobility  Bed Mobility Overal bed mobility: Needs Assistance Bed Mobility: Supine to Sit     Supine to sit: Min assist     General bed mobility comments: cues for sequence and use of L LE to self assist`  Transfers Overall transfer level: Needs assistance Equipment used: Rolling walker (2 wheeled) Transfers: Sit to/from Stand Sit to Stand: Min guard         General transfer comment: cues for UE/LE placement  Ambulation/Gait Ambulation/Gait assistance: Min assist;Min guard Ambulation Distance (Feet): 70 Feet (and 18' from bathroom) Assistive device: Rolling walker (2 wheeled) Gait Pattern/deviations: Step-to pattern;Decreased step length - right;Decreased step length - left;Shuffle;Trunk flexed Gait velocity: decr Gait velocity interpretation: Below normal speed for age/gender General Gait Details: cues for sequence, posture and position from [employer]            Wheelchair Mobility    Modified Rankin (Stroke Patients Only)       Balance                                             Pertinent Vitals/Pain Pain Assessment: 0-10 Pain Score: 4  Pain Location: R knee Pain Descriptors / Indicators: Aching;Sore Pain Intervention(s): Limited activity within patient's  tolerance;Monitored during session;Premedicated before session;Ice applied    Home Living Family/patient expects to be discharged to:: Private residence Living Arrangements: Spouse/significant other Available Help at Discharge: Family Type of Home: House Home Access: Stairs to enter Entrance Stairs-Rails: None Entrance Stairs-Number of Steps: 3 Home Layout: Two level Home Equipment: Bedside commode;Shower seat      Prior Function Level of Independence: Independent               Hand Dominance        Extremity/Trunk Assessment   Upper Extremity Assessment: Overall WFL for tasks assessed           Lower Extremity Assessment: RLE deficits/detail RLE Deficits / Details: 3/5 quads with IND SLR; AAROM at knee -10 - 75    Cervical / Trunk Assessment: Normal  Communication   Communication: No difficulties  Cognition Arousal/Alertness: Awake/alert Behavior During Therapy: WFL for tasks assessed/performed Overall Cognitive Status: Within Functional Limits for tasks assessed                      General Comments      Exercises Total Joint Exercises Ankle Circles/Pumps: AROM;Both;15 reps;Supine Quad Sets: AROM;Both;10 reps;Supine Heel Slides: AAROM;Right;15 reps;Supine Straight Leg Raises: AAROM;AROM;Right;15 reps;Supine      Assessment/Plan    PT Assessment Patient needs continued PT services  PT Diagnosis Difficulty walking   PT Problem List Decreased strength;Decreased range of motion;Decreased activity tolerance;Decreased mobility;Decreased knowledge of use  of DME;Pain  PT Treatment Interventions DME instruction;Gait training;Stair training;Functional mobility training;Therapeutic activities;Therapeutic exercise;Patient/family education   PT Goals (Current goals can be found in the Care Plan section) Acute Rehab PT Goals Patient Stated Goal: home today PT Goal Formulation: With patient Time For Goal Achievement: 08/08/15 Potential to Achieve  Goals: Good    Frequency Min 6X/week   Barriers to discharge        Co-evaluation               End of Session Equipment Utilized During Treatment: Gait belt Activity Tolerance: Patient tolerated treatment well Patient left: in chair;with call bell/phone within reach;with chair alarm set;with family/visitor present Nurse Communication: Mobility status         Time: 0810-0840 PT Time Calculation (min) (ACUTE ONLY): 30 min   Charges:   PT Evaluation $PT Eval Low Complexity: 1 Procedure PT Treatments $Therapeutic Exercise: 8-22 mins   PT G Codes:        Karie Skowron 09-01-2015, 12:47 PM

## 2015-08-05 NOTE — Progress Notes (Signed)
     Subjective: 1 Day Post-Op Procedure(s) (LRB): RIGHT TOTAL KNEE ARTHROPLASTY (Right)   Patient reports pain as mild, pain controlled. Pain spiked last night about 11, but has since been controlled.  No events throughout the night, otherwise.   Ready to be discharged home, if she does well with PT.   Objective:   VITALS:   Filed Vitals:   08/05/15 0151 08/05/15 0452  BP: 124/55 113/38  Pulse: 82 66  Temp: 97.5 F (36.4 C) 98.4 F (36.9 C)  Resp: 17 16    Dorsiflexion/Plantar flexion intact Incision: dressing C/D/I No cellulitis present Compartment soft  LABS  Recent Labs  08/05/15 0413  HGB 10.8*  HCT 32.5*  WBC 10.2  PLT 269     Recent Labs  08/05/15 0413  NA 137  K 4.5  BUN 14  CREATININE 0.76  GLUCOSE 185*     Assessment/Plan: 1 Day Post-Op Procedure(s) (LRB): RIGHT TOTAL KNEE ARTHROPLASTY (Right) Foley cath d/c'ed Advance diet Up with therapy D/C IV fluids Discharge home with home health Follow up in 2 weeks at [facility]. Follow up with OLIN,Lan Mcneill D in 2 weeks.  Contact information:  [facility] [address], [place] [id number]    Obese (BMI 30-39.9) Estimated body mass index is 36.5 kg/(m^2) as calculated from the following:   Height as of this encounter: 5\' 3"  (1.6 m).   Weight as of this encounter: 93.441 kg (206 lb). Patient also counseled that weight may inhibit the healing process Patient counseled that losing weight will help with future health issues      West Pugh. Clotine Heiner   PAC  08/05/2015, 8:22 AM

## 2015-08-05 NOTE — Progress Notes (Signed)
[facility]    [facility] is providing the following services: rw  If patient discharges after hours, please call [phone].   Tammy Cooper 08/05/2015, 11:45 AM

## 2015-08-10 NOTE — Discharge Summary (Signed)
Physician Discharge Summary  Patient ID: Tammy Cooper MRN: [id number] DOB/AGE: [birth year] 65 y.o.  Admit date: 08/04/2015 Discharge date: 08/05/2015   Procedures:  Procedure(s) (LRB): RIGHT TOTAL KNEE ARTHROPLASTY (Right)  Attending Physician:  Dr. Paralee Cancel   Admission Diagnoses:   Right knee primary OA / pain  Discharge Diagnoses:  Principal Problem:   S/P right TKA Active Problems:   S/P knee replacement   Obese  Past Medical History  Diagnosis Date  . Hypertension   . Hypercholesteremia   . Arthritis   . Anxiety   . Breast cancer ([place]) 05/28/14    Left breast in situ   . Complication of anesthesia   . PONV (postoperative nausea and vomiting)     "a little nausea"  . Stroke [facility])     H/O TIA in 2006, MRA clear,arteriogram was done which was normal  . Spinal stenosis   . Constipation     occasiopnal  . History of recurrent cystitis   . History of breast cancer 2016    HPI:    Tammy Cooper, 65 y.o. female, has a history of pain and functional disability in the right knee due to arthritis and has failed non-surgical conservative treatments for greater than 12 weeks to include NSAID's and/or analgesics, corticosteriod injections and activity modification. Onset of symptoms was gradual, starting >10 years ago with gradually worsening course since that time. The patient noted no past surgery on the right knee(s). Patient currently rates pain in the right knee(s) at 8 out of 10 with activity. Patient has night pain, worsening of pain with activity and weight bearing, pain that interferes with activities of daily living, pain with passive range of motion, crepitus and joint swelling. Patient has evidence of periarticular osteophytes and joint space narrowing by imaging studies. There is no active infection. Risks, benefits and expectations were discussed with the patient. Risks including but not limited to the risk of anesthesia, blood clots,  nerve damage, blood vessel damage, failure of the prosthesis, infection and up to and including death. Patient understand the risks, benefits and expectations and wishes to proceed with surgery.   PCP: Lynne Logan, MD   Discharged Condition: good  Hospital Course:  Patient underwent the above stated procedure on 08/04/2015. Patient tolerated the procedure well and brought to the recovery room in good condition and subsequently to the floor.  POD #1 BP: 113/38 ; Pulse: 66 ; Temp: 98.4 F (36.9 C) ; Resp: 16 Patient reports pain as mild, pain controlled. Pain spiked last night about 11, but has since been controlled. No events throughout the night, otherwise. Ready to be discharged home. Dorsiflexion/plantar flexion intact, incision: dressing C/D/I, no cellulitis present and compartment soft.   LABS  Basename    HGB     10.8  HCT     32.5    Discharge Exam: General appearance: alert, cooperative and no distress Extremities: Homans sign is negative, no sign of DVT, no edema, redness or tenderness in the calves or thighs and no ulcers, gangrene or trophic changes  Disposition: Home with follow up in 2 weeks   Follow-up Information    Follow up with Mauri Pole, MD. Schedule an appointment as soon as possible for a visit in 2 weeks.   Specialty:  Orthopedic Surgery   Contact information:   [address] [place] [address] [phone]       Follow up with [facility].   Why:  rolling walker   Contact information:   [place] [address] [phone]       Follow up with [facility].   Why:  home health physical therapy   Contact information:   [place] [address] [phone]       Discharge Instructions    Call MD / Call 911    Complete by:  As directed   If you experience chest pain or shortness of breath, CALL 911 and be transported to the hospital emergency room.   If you develope a fever above 101 F, pus (white drainage) or increased drainage or redness at the wound, or calf pain, call your surgeon's office.     Change dressing    Complete by:  As directed   Maintain surgical dressing until follow up in the clinic. If the edges start to pull up, may reinforce with tape. If the dressing is no longer working, may remove and cover with gauze and tape, but must keep the area dry and clean.  Call with any questions or concerns.     Constipation Prevention    Complete by:  As directed   Drink plenty of fluids.  Prune juice may be helpful.  You may use a stool softener, such as Colace (over the counter) 100 mg twice a day.  Use MiraLax (over the counter) for constipation as needed.     Diet - low sodium heart healthy    Complete by:  As directed      Discharge instructions    Complete by:  As directed   Maintain surgical dressing until follow up in the clinic. If the edges start to pull up, may reinforce with tape. If the dressing is no longer working, may remove and cover with gauze and tape, but must keep the area dry and clean.  Follow up in 2 weeks at [facility]. Call with any questions or concerns.     Increase activity slowly as tolerated    Complete by:  As directed   Weight bearing as tolerated with assist device (walker, cane, etc) as directed, use it as long as suggested by your surgeon or therapist, typically at least 4-6 weeks.     TED hose    Complete by:  As directed   Use stockings (TED hose) for 2 weeks on both leg(s).  You may remove them at night for sleeping.             Medication List    STOP taking these medications        aspirin 81 MG tablet  Replaced by:  aspirin 325 MG EC tablet     ibuprofen 200 MG tablet  Commonly known as:  ADVIL,MOTRIN      TAKE these medications        anastrozole 1 MG tablet  Commonly known as:  ARIMIDEX  Take 1 tablet (1 mg total) by mouth daily.     aspirin 325 MG EC tablet    Take 1 tablet (325 mg total) by mouth 2 (two) times daily.     celecoxib 200 MG capsule  Commonly known as:  CELEBREX  Take 1 capsule (200 mg total) by mouth every 12 (twelve) hours.     Co-Enzyme Q10 200 MG Caps  Take 300 mg by mouth daily.     docusate sodium 100 MG capsule  Commonly known as:  COLACE  Take 1 capsule (100 mg total) by mouth  2 (two) times daily.     ferrous sulfate 325 (65 FE) MG tablet  Take 1 tablet (325 mg total) by mouth 3 (three) times daily after meals.     HYDROcodone-acetaminophen 7.5-325 MG tablet  Commonly known as:  NORCO  Take 1-2 tablets by mouth every 4 (four) hours as needed for moderate pain.     non-metallic deodorant Misc  Commonly known as:  ALRA  Apply 1 application topically daily.     polyethylene glycol packet  Commonly known as:  MIRALAX / GLYCOLAX  Take 17 g by mouth 2 (two) times daily.     rosuvastatin 10 MG tablet  Commonly known as:  CRESTOR  Take 1 tablet (10 mg total) by mouth daily.     telmisartan-hydrochlorothiazide 40-12.5 MG tablet  Commonly known as:  MICARDIS HCT  Take 1 tablet by mouth daily.     tiZANidine 4 MG tablet  Commonly known as:  ZANAFLEX  Take 1 tablet (4 mg total) by mouth every 6 (six) hours as needed for muscle spasms.     valACYclovir 1000 MG tablet  Commonly known as:  VALTREX  Take 1,000 mg by mouth 2 (two) times daily as needed (Fever blisters).     venlafaxine XR 37.5 MG 24 hr capsule  Commonly known as:  EFFEXOR-XR  TAKE 1 CAPSULE IN THE MORNING WITH BREAKFAST.     ZETIA 10 MG tablet  Generic drug:  ezetimibe  TAKE 1 TABLET ONCE DAILY.         Signed: West Pugh. Ijeoma Loor   PA-C  08/10/2015, 12:38 PM

## 2015-08-20 ENCOUNTER — Ambulatory Visit (HOSPITAL_BASED_OUTPATIENT_CLINIC_OR_DEPARTMENT_OTHER): Payer: Medicare Other | Admitting: Hematology and Oncology

## 2015-08-20 ENCOUNTER — Encounter: Payer: Self-pay | Admitting: Hematology and Oncology

## 2015-08-20 ENCOUNTER — Telehealth: Payer: Self-pay | Admitting: Hematology and Oncology

## 2015-08-20 VITALS — BP 127/41 | HR 60 | Temp 99.8°F | Resp 18 | Wt 199.8 lb

## 2015-08-20 DIAGNOSIS — F329 Major depressive disorder, single episode, unspecified: Secondary | ICD-10-CM

## 2015-08-20 DIAGNOSIS — D0512 Intraductal carcinoma in situ of left breast: Secondary | ICD-10-CM

## 2015-08-20 DIAGNOSIS — N951 Menopausal and female climacteric states: Secondary | ICD-10-CM

## 2015-08-20 DIAGNOSIS — C50412 Malignant neoplasm of upper-outer quadrant of left female breast: Secondary | ICD-10-CM

## 2015-08-20 NOTE — Assessment & Plan Note (Signed)
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 Prognosis: Based on [facility] prognostic nomogram, with antiestrogen therapy and radiation therapy her 5 year risk of recurrence of 1% in 10 year risk is a 2%. Adjuvant radiation completed 10/31/2014 Current treatment: Adjuvant anastrozole 1 mg daily ( chosen because of history of TIA) since 11/18/2014  Major depression: Along with severe hot flashes. I recommended starting Effexor XR 37.5 mg once daily. This would help both depression and hot flashes.  Anastrozole toxicities: 1. Hot flashes  Breast Cancer Surveillance: 1. Breast exam 08/20/2015 did not reveal any suspicious findings 2. Mammogram 05/11/2015: No evidence of malignancy stable bilateral benign breast calcifications   Return to clinic in 6 months for follow-up

## 2015-08-20 NOTE — Progress Notes (Signed)
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 She was started on Effexor which has improved the depression symptoms as well as a severe hot flashes. Recently she undergone right knee replacement and appears to be healing and recovering very well. Today she is here accompanied by her daughter-in-law.  REVIEW OF SYSTEMS:   Constitutional: Denies fevers, chills or abnormal weight loss Eyes: Denies blurriness of vision Ears, nose, mouth, throat, and face: Denies mucositis or sore throat Respiratory: Denies cough, dyspnea or wheezes Cardiovascular: Denies palpitation, chest discomfort Gastrointestinal:  Denies nausea, heartburn or change in bowel habits Skin: Denies abnormal skin rashes Lymphatics: Denies new lymphadenopathy or easy bruising Neurological:Denies numbness, tingling or new weaknesses Behavioral/Psych: Mood is stable, no new changes  Extremities: No lower extremity edema Breast:  denies any pain or lumps or nodules in either breasts All other systems were reviewed with the patient and are negative.  I have reviewed the past medical history, past surgical history, social history and family history with the patient and they  are unchanged from previous note.  ALLERGIES:  is allergic to lipitor.  MEDICATIONS:  Current Outpatient Prescriptions  Medication Sig Dispense Refill  . anastrozole (ARIMIDEX) 1 MG tablet Take 1 tablet (1 mg total) by mouth daily. 90 tablet 3  . aspirin EC 325 MG EC tablet Take 1 tablet (325 mg total) by mouth 2 (two) times daily. 60 tablet 0  . celecoxib (CELEBREX) 200 MG capsule Take 1 capsule (200 mg total) by mouth every 12 (twelve) hours. 60 capsule 0  . Co-Enzyme Q10 200 MG CAPS Take 300 mg by mouth daily.     [name] docusate sodium (COLACE) 100 MG capsule Take 1 capsule (100 mg total) by mouth 2 (two) times daily. 10 capsule 0  . ferrous sulfate 325 (65 FE) MG tablet Take 1 tablet (325 mg total) by mouth 3 (three) times daily after meals.  3  . HYDROcodone-acetaminophen (NORCO) 7.5-325 MG tablet Take 1-2 tablets by mouth every 4 (four) hours as needed for moderate pain. 100 tablet 0  . non-metallic deodorant (ALRA) MISC Apply 1 application topically daily.     . polyethylene glycol (MIRALAX / GLYCOLAX) packet Take 17 g by mouth 2 (two) times daily. 14 each 0  . rosuvastatin (CRESTOR) 10 MG tablet Take 1 tablet (10 mg total) by mouth daily. 90 tablet 3  . telmisartan-hydrochlorothiazide (MICARDIS HCT) 40-12.5 MG tablet Take 1 tablet by mouth daily. 90 tablet 1  . tiZANidine (ZANAFLEX) 4 MG tablet Take 1 tablet (4 mg total) by mouth every 6 (six) hours as needed for muscle spasms. 40 tablet 0  . valACYclovir (VALTREX) 1000 MG tablet Take 1,000 mg by mouth 2 (two) times daily as needed (Fever blisters).    [name]  venlafaxine XR (EFFEXOR-XR) 37.5 MG 24 hr capsule TAKE 1 CAPSULE IN THE MORNING WITH BREAKFAST. 90 capsule 3  . ZETIA 10 MG tablet TAKE 1 TABLET ONCE DAILY. 90 tablet 3   No current facility-administered medications for this visit.    PHYSICAL EXAMINATION: ECOG PERFORMANCE STATUS: 1 - Symptomatic but completely ambulatory  Filed Vitals:   08/20/15 0955  BP: 127/41  Pulse: 60    Temp: 99.8 F (37.7 C)  Resp: 18   Filed Weights   08/20/15 0955  Weight: 199 lb 12.8 oz (90.629 kg)    GENERAL:alert, no distress and comfortable SKIN: skin color, texture, turgor are normal, no rashes or significant lesions EYES: normal, Conjunctiva are pink and non-injected, sclera clear OROPHARYNX:no exudate, no erythema and lips, buccal mucosa, and tongue normal  NECK: supple, thyroid normal size, non-tender, without nodularity LYMPH:  no palpable lymphadenopathy in the cervical, axillary or inguinal LUNGS: clear to auscultation and percussion with normal breathing effort HEART: regular rate & rhythm and no murmurs and no lower extremity edema ABDOMEN:abdomen soft, non-tender and normal bowel sounds MUSCULOSKELETAL:no cyanosis of digits and no clubbing  NEURO: alert & oriented x 3 with fluent speech, no focal motor/sensory deficits EXTREMITIES: No lower extremity edema BREAST: No palpable masses or nodules in either right or left breasts. No palpable axillary supraclavicular or infraclavicular adenopathy no breast tenderness or nipple discharge. (exam performed in the presence of a chaperone)  LABORATORY DATA:  I have reviewed the data as listed   Chemistry      Component Value Date/Time   NA 137 08/05/2015 0413   K 4.5 08/05/2015 0413   CL 102 08/05/2015 0413   CO2 26 08/05/2015 0413   BUN 14 08/05/2015 0413   CREATININE 0.76 08/05/2015 0413      Component Value Date/Time   CALCIUM 8.9 08/05/2015 0413   ALKPHOS 85 09/02/2014 0730   AST 20 09/02/2014 0730   ALT 24 09/02/2014 0730   BILITOT 0.4 09/02/2014 0730       Lab Results  Component Value Date   WBC 10.2 08/05/2015   HGB 10.8* 08/05/2015   HCT 32.5* 08/05/2015   MCV 88.6 08/05/2015   PLT 269 08/05/2015   NEUTROABS 3.8 12/07/2006   ASSESSMENT & PLAN:  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 Prognosis: Based on [facility] prognostic nomogram, with antiestrogen therapy and radiation therapy her 5 year risk of recurrence of 1% in 10 year risk is a 2%. Adjuvant radiation completed 10/31/2014 Current treatment: Adjuvant anastrozole 1 mg daily ( chosen because of history of TIA) since 11/18/2014  Major depression: Much improved with Effexor XR 37.5 mg once daily. This has been helping  both depression and hot flashes.  Anastrozole toxicities: 1. Hot flashes: Much improved after Effexor was started 2. Mood swings are also improved  Breast Cancer Surveillance: 1. Breast exam 08/20/2015 did not reveal any suspicious findings 2. Mammogram 05/11/2015: No evidence of malignancy stable bilateral benign breast calcifications  Recent right knee replacement surgery she is doing very well for being out of surgery 2 weeks ago.  She plans to do a left knee replacement surgery in 6 months.  Return to clinic in 6 months for follow-up   No orders of the defined types were placed in this encounter.   The patient has a good understanding of the overall plan. she agrees with it. she will call  with any problems that may develop before the next visit here.   Rulon Eisenmenger, MD 08/20/2015

## 2015-08-20 NOTE — Telephone Encounter (Signed)
appt made and avs printed °

## 2015-08-20 NOTE — Progress Notes (Signed)
Unable to get in to exam room prior to MD.  No assessment performed.  

## 2015-09-07 ENCOUNTER — Other Ambulatory Visit (INDEPENDENT_AMBULATORY_CARE_PROVIDER_SITE_OTHER): Payer: Medicare Other | Admitting: *Deleted

## 2015-09-07 DIAGNOSIS — E785 Hyperlipidemia, unspecified: Secondary | ICD-10-CM | POA: Diagnosis not present

## 2015-09-07 LAB — HEPATIC FUNCTION PANEL
ALK PHOS: 87 U/L (ref 33–130)
ALT: 21 U/L (ref 6–29)
AST: 22 U/L (ref 10–35)
Albumin: 4.6 g/dL (ref 3.6–5.1)
BILIRUBIN DIRECT: 0.1 mg/dL (ref <=0.2)
BILIRUBIN INDIRECT: 0.4 mg/dL (ref 0.2–1.2)
TOTAL PROTEIN: 6.9 g/dL (ref 6.1–8.1)
Total Bilirubin: 0.5 mg/dL (ref 0.2–1.2)

## 2015-10-23 ENCOUNTER — Other Ambulatory Visit (INDEPENDENT_AMBULATORY_CARE_PROVIDER_SITE_OTHER): Payer: Medicare Other | Admitting: *Deleted

## 2015-10-23 DIAGNOSIS — E785 Hyperlipidemia, unspecified: Secondary | ICD-10-CM

## 2015-10-28 LAB — CARDIO IQ(R) ADVANCED LIPID PANEL
Apolipoprotein B: 66 mg/dL (ref 49–103)
CHOLESTEROL/HDL RATIO (CARDIO IQ ADV LIPID PANEL): 2.6 calc (ref <=5.0)
Cholesterol, Total: 140 mg/dL (ref 125–200)
HDL Cholesterol: 53 mg/dL (ref >=46)
LDL LARGE: 5092 nmol/L (ref 5038–17886)
LDL Medium: 212 nmol/L (ref 121–397)
LDL PEAK SIZE: 218.1 Angstrom — AB (ref >=218.2)
LDL Particle Number: 997 nmol/L — ABNORMAL LOW (ref 1016–2185)
LDL SMALL: 199 nmol/L (ref 115–386)
LDL, Calculated: 59 mg/dL
LIPOPROTEIN (A) (CARDIO IQ ADV LIPID PANEL): 33 nmol/L (ref <75)
Non-HDL Cholesterol: 87 mg/dL
TRIGLYCERIDES (CARDIO IQ ADV LIPID PANEL): 139 mg/dL

## 2015-11-19 ENCOUNTER — Other Ambulatory Visit: Payer: Self-pay | Admitting: Family Medicine

## 2015-11-19 ENCOUNTER — Ambulatory Visit
Admission: RE | Admit: 2015-11-19 | Discharge: 2015-11-19 | Disposition: A | Discharge status: Home / Self Care | Payer: Medicare Other | Source: Ambulatory Visit | Attending: Family Medicine | Admitting: Family Medicine

## 2015-11-19 DIAGNOSIS — I824Y1 Acute embolism and thrombosis of unspecified deep veins of right proximal lower extremity: Secondary | ICD-10-CM

## 2015-11-21 ENCOUNTER — Other Ambulatory Visit: Payer: Self-pay | Admitting: Family Medicine

## 2015-11-21 DIAGNOSIS — M79604 Pain in right leg: Secondary | ICD-10-CM

## 2015-11-23 ENCOUNTER — Encounter (HOSPITAL_COMMUNITY): Payer: Self-pay

## 2015-11-23 NOTE — Progress Notes (Signed)
lov dr turner 7/16 ekg 3/17 epic

## 2015-11-23 NOTE — Patient Instructions (Addendum)
Tammy Cooper  11/23/2015   Your procedure is scheduled on: 12/01/15  Report to [facility] Main  Entrance take [place]  elevators to 3rd floor to  [place] at  [phone] AM.  Call this number if you have problems the morning of surgery [phone]   Remember: ONLY 1 PERSON MAY GO WITH YOU TO SHORT STAY TO GET  READY MORNING OF [place].  Do not eat food or drink liquids :After Midnight.     Take these medicines the morning of surgery with A SIP OF WATER: Effexor(venlafaxine), Arimidex  DO NOT TAKE ANY DIABETIC MEDICATIONS DAY OF YOUR SURGERY                               You may not have any metal on your body including hair pins and              piercings  Do not wear jewelry, make-up, lotions, powders or perfumes, deodorant             Do not wear nail polish.  Do not shave  48 hours prior to surgery.              Men may shave face and neck.   Do not bring valuables to the hospital. [place].  Contacts, dentures or bridgework may not be worn into surgery.  Leave suitcase in the car. After surgery it may be brought to your room.              [place] - Preparing for Surgery Before surgery, you can play an important role.  Because skin is not sterile, your skin needs to be as free of germs as possible.  You can reduce the number of germs on your skin by washing with CHG (chlorahexidine gluconate) soap before surgery.  CHG is an antiseptic cleaner which kills germs and bonds with the skin to continue killing germs even after washing. Please DO NOT use if you have an allergy to CHG or antibacterial soaps.  If your skin becomes reddened/irritated stop using the CHG and inform your nurse when you arrive at Short Stay. Do not shave (including legs and underarms) for at least 48 hours prior to the first CHG shower.  You may shave your face/neck. Please follow these instructions carefully:  1.   Shower with CHG Soap the night before surgery and the  morning of Surgery.  2.  If you choose to wash your hair, wash your hair first as usual with your  normal  shampoo.  3.  After you shampoo, rinse your hair and body thoroughly to remove the  shampoo.                           4.  Use CHG as you would any other liquid soap.  You can apply chg directly  to the skin and wash                       Gently with a scrungie or clean washcloth.  5.  Apply the CHG Soap to your body ONLY FROM THE NECK DOWN.   Do not use on face/  open                           Wound or open sores. Avoid contact with eyes, ears mouth and genitals (private parts).                       Wash face,  Genitals (private parts) with your normal soap.             6.  Wash thoroughly, paying special attention to the area where your surgery  will be performed.  7.  Thoroughly rinse your body with warm water from the neck down.  8.  DO NOT shower/wash with your normal soap after using and rinsing off  the CHG Soap.                9.  Pat yourself dry with a clean towel.            10.  Wear clean pajamas.            11.  Place clean sheets on your bed the night of your first shower and do not  sleep with pets. Day of Surgery : Do not apply any lotions/deodorants the morning of surgery.  Please wear clean clothes to the hospital/surgery center.  FAILURE TO FOLLOW THESE INSTRUCTIONS MAY RESULT IN THE CANCELLATION OF YOUR SURGERY PATIENT SIGNATURE_________________________________  NURSE SIGNATURE__________________________________  ________________________________________________________________________   Adam Phenix  An incentive spirometer is a tool that can help keep your lungs clear and active. This tool measures how well you are filling your lungs with each breath. Taking long deep breaths may help reverse or decrease the chance of developing breathing (pulmonary) problems (especially infection) following:  A long  period of time when you are unable to move or be active. BEFORE THE PROCEDURE   If the spirometer includes an indicator to show your best effort, your nurse or respiratory therapist will set it to a desired goal.  If possible, sit up straight or lean slightly forward. Try not to slouch.  Hold the incentive spirometer in an upright position. INSTRUCTIONS FOR USE  1. Sit on the edge of your bed if possible, or sit up as far as you can in bed or on a chair. 2. Hold the incentive spirometer in an upright position. 3. Breathe out normally. 4. Place the mouthpiece in your mouth and seal your lips tightly around it. 5. Breathe in slowly and as deeply as possible, raising the piston or the ball toward the top of the column. 6. Hold your breath for 3-5 seconds or for as long as possible. Allow the piston or ball to fall to the bottom of the column. 7. Remove the mouthpiece from your mouth and breathe out normally. 8. Rest for a few seconds and repeat Steps 1 through 7 at least 10 times every 1-2 hours when you are awake. Take your time and take a few normal breaths between deep breaths. 9. The spirometer may include an indicator to show your best effort. Use the indicator as a goal to work toward during each repetition. 10. After each set of 10 deep breaths, practice coughing to be sure your lungs are clear. If you have an incision (the cut made at the time of surgery), support your incision when coughing by placing a pillow or rolled up towels firmly against it. Once you are able to get out of bed, walk around indoors and  cough well. You may stop using the incentive spirometer when instructed by your caregiver.  RISKS AND COMPLICATIONS  Take your time so you do not get dizzy or light-headed.  If you are in pain, you may need to take or ask for pain medication before doing incentive spirometry. It is harder to take a deep breath if you are having pain. AFTER USE  Rest and breathe slowly and  easily.  It can be helpful to keep track of a log of your progress. Your caregiver can provide you with a simple table to help with this. If you are using the spirometer at home, follow these instructions: [place] IF:   You are having difficultly using the spirometer.  You have trouble using the spirometer as often as instructed.  Your pain medication is not giving enough relief while using the spirometer.  You develop fever of 100.5 F (38.1 C) or higher. SEEK IMMEDIATE MEDICAL CARE IF:   You cough up bloody sputum that had not been present before.  You develop fever of 102 F (38.9 C) or greater.  You develop worsening pain at or near the incision site. MAKE SURE YOU:   Understand these instructions.  Will watch your condition.  Will get help right away if you are not doing well or get worse. Document Released: 08/22/2006 Document Revised: 07/04/2011 Document Reviewed: 10/23/2006 Exit[employer] Patient Information 2014 [employer], [place].   ________________________________________________________________________  WHAT IS A BLOOD TRANSFUSION? Blood Transfusion Information  A transfusion is the replacement of blood or some of its parts. Blood is made up of multiple cells which provide different functions.  Red blood cells carry oxygen and are used for blood loss replacement.  White blood cells fight against infection.  Platelets control bleeding.  Plasma helps clot blood.  Other blood products are available for specialized needs, such as hemophilia or other clotting disorders. BEFORE THE TRANSFUSION  Who gives blood for transfusions?   Healthy volunteers who are fully evaluated to make sure their blood is safe. This is blood bank blood. Transfusion therapy is the safest it has ever been in the practice of medicine. Before blood is taken from a donor, a complete history is taken to make sure that person has no history of diseases nor engages in risky social  behavior (examples are intravenous drug use or sexual activity with multiple partners). The donor's travel history is screened to minimize risk of transmitting infections, such as malaria. The donated blood is tested for signs of infectious diseases, such as HIV and hepatitis. The blood is then tested to be sure it is compatible with you in order to minimize the chance of a transfusion reaction. If you or a relative donates blood, this is often done in anticipation of surgery and is not appropriate for emergency situations. It takes many days to process the donated blood. RISKS AND COMPLICATIONS Although transfusion therapy is very safe and saves many lives, the main dangers of transfusion include:   Getting an infectious disease.  Developing a transfusion reaction. This is an allergic reaction to something in the blood you were given. Every precaution is taken to prevent this. The decision to have a blood transfusion has been considered carefully by your caregiver before blood is given. Blood is not given unless the benefits outweigh the risks. AFTER THE TRANSFUSION  Right after receiving a blood transfusion, you will usually feel much better and more energetic. This is especially true if your red blood cells have gotten low (anemic).  The transfusion raises the level of the red blood cells which carry oxygen, and this usually causes an energy increase.  The nurse administering the transfusion will monitor you carefully for complications. HOME CARE INSTRUCTIONS  No special instructions are needed after a transfusion. You may find your energy is better. Speak with your caregiver about any limitations on activity for underlying diseases you may have. SEEK MEDICAL CARE IF:   Your condition is not improving after your transfusion.  You develop redness or irritation at the intravenous (IV) site. SEEK IMMEDIATE MEDICAL CARE IF:  Any of the following symptoms occur over the next 12 hours:  Shaking  chills.  You have a temperature by mouth above 102 F (38.9 C), not controlled by medicine.  Chest, back, or muscle pain.  People around you feel you are not acting correctly or are confused.  Shortness of breath or difficulty breathing.  Dizziness and fainting.  You get a rash or develop hives.  You have a decrease in urine output.  Your urine turns a dark color or changes to pink, red, or brown. Any of the following symptoms occur over the next 10 days:  You have a temperature by mouth above 102 F (38.9 C), not controlled by medicine.  Shortness of breath.  Weakness after normal activity.  The white part of the eye turns yellow (jaundice).  You have a decrease in the amount of urine or are urinating less often.  Your urine turns a dark color or changes to pink, red, or brown. Document Released: 04/08/2000 Document Revised: 07/04/2011 Document Reviewed: 11/26/2007 [facility] Patient Information 2014 [place], [place].  _______________________________________________________________________

## 2015-11-23 NOTE — H&P (Signed)
TOTAL KNEE ADMISSION H&P  Patient is being admitted for left total knee arthroplasty.  Subjective:  Chief Complaint:     Left knee primary OA / pain  HPI: Tammy Cooper, 65 y.o. female, has a history of pain and functional disability in the left knee due to arthritis and has failed non-surgical conservative treatments for greater than 12 weeks to include NSAID's and/or analgesics, corticosteriod injections and activity modification.  Onset of symptoms was gradual, starting 7+ years ago with gradually worsening course since that time. The patient noted prior procedures on the knee to include  arthroplasty on the right knee in April 2017.  Patient currently rates pain in the left knee(s) at 9 out of 10 with activity. Patient has night pain, worsening of pain with activity and weight bearing, pain that interferes with activities of daily living and pain with passive range of motion.  Patient has evidence of periarticular osteophytes and joint space narrowing by imaging studies. There is no active infection.   Risks, benefits and expectations were discussed with the patient.  Risks including but not limited to the risk of anesthesia, blood clots, nerve damage, blood vessel damage, failure of the prosthesis, infection and up to and including death.  Patient understand the risks, benefits and expectations and wishes to proceed with surgery.   PCP: Tammy Logan, MD  D/C Plans:      Home  Post-op Meds:       No Rx given   Tranexamic Acid:      To be given - IV   Decadron:      Is to be given  FYI:     ASA  Norco    Patient Active Problem List   Diagnosis Date Noted  . Obese 08/05/2015  . S/P right TKA 08/04/2015  . S/P knee replacement 08/04/2015  . Breast cancer of upper-outer quadrant of left female breast ([place]) 08/18/2014  . Obesity 11/12/2013  . DYSPNEA/SHORTNESS OF BREATH 12/07/2006  . Hyperlipidemia 08/30/2006  . TIA 08/30/2006  . DEGENERATIVE JOINT DISEASE, RIGHT KNEE  08/28/2006  . TAH/BSO, HX OF 08/28/2006  . HSV 05/23/2006  . HYPERTHYROIDISM 05/23/2006  . DEPRESSION 05/23/2006  . Essential hypertension 05/23/2006   Past Medical History:  Diagnosis Date  . Anxiety   . Arthritis   . Breast cancer ([place]) 05/28/14   Left breast in situ   . Complication of anesthesia   . Constipation    occasiopnal  . History of breast cancer 2016  . History of recurrent cystitis   . Hypercholesteremia   . Hypertension   . PONV (postoperative nausea and vomiting)    "a little nausea"  . Spinal stenosis   . Stroke [facility])    H/O TIA in 2006, MRA clear,arteriogram was done which was normal    Past Surgical History:  Procedure Laterality Date  . anal skin tag removal  10/21/10  . BACK SURGERY  07/12/2010   stenosis L4 + L5  . BLADDER TACK     WITH THE HYSTERECTOMY  . BREAST LUMPECTOMY WITH RADIOACTIVE SEED LOCALIZATION Left 07/31/2014   Procedure: LEFT BREAST LUMPECTOMY WITH RADIOACTIVE SEED LOCALIZATION;  Surgeon: Coralie Keens, MD;  Location: [facility];  Service: General;  Laterality: Left;  . COLONOSCOPY    . HEMORROIDECTOMY    . KNEE ARTHROSCOPY  2010   left  . ROTATOR CUFF REPAIR  2005   rt shoulder  . TONSILLECTOMY    . TOTAL KNEE ARTHROPLASTY Right 08/04/2015  Procedure: RIGHT TOTAL KNEE ARTHROPLASTY;  Surgeon: Paralee Cancel, MD;  Location: [facility];  Service: Orthopedics;  Laterality: Right;  [name] VAGINAL HYSTERECTOMY  2003   total hyst-BSO-Ant/post repair    No prescriptions prior to admission.   Allergies  Allergen Reactions  . Lipitor [Atorvastatin]     Muscle aches    Social History  Substance Use Topics  . Smoking status: Never Smoker  . Smokeless tobacco: Never Used  . Alcohol use Yes     Comment: ocassionally    Family History  Problem Relation Age of Onset  . Cancer Father     lung  . Hypertension Mother   . Hypertension Sister   . Heart attack Neg Hx      Review of Systems  Constitutional: Negative.   HENT:  Positive for tinnitus.   Eyes: Negative.   Respiratory: Positive for shortness of breath (with exertion).   Cardiovascular: Negative.   Gastrointestinal: Positive for constipation.  Genitourinary: Positive for frequency.  Musculoskeletal: Positive for back pain and joint pain.  Skin: Negative.   Neurological: Negative.   Endo/Heme/Allergies: Negative.   Psychiatric/Behavioral: Positive for depression. The patient is nervous/anxious.     Objective:  Physical Exam  Constitutional: She is oriented to person, place, and time. She appears well-developed.  HENT:  Head: Normocephalic.  Eyes: Pupils are equal, round, and reactive to light.  Neck: Neck supple. No JVD present. No tracheal deviation present. No thyromegaly present.  Cardiovascular: Normal rate, regular rhythm, normal heart sounds and intact distal pulses.   Respiratory: Effort normal and breath sounds normal. No respiratory distress. She has no wheezes.  GI: Soft. There is no tenderness. There is no guarding.  Musculoskeletal:       Left knee: She exhibits decreased range of motion, swelling and bony tenderness. She exhibits no ecchymosis, no deformity, no laceration, no erythema and normal alignment. Tenderness found.  Lymphadenopathy:    She has no cervical adenopathy.  Neurological: She is alert and oriented to person, place, and time.  Skin: Skin is warm and dry.  Psychiatric: She has a normal mood and affect.      Labs:  Estimated body mass index is 35.39 kg/m as calculated from the following:   Height as of 08/04/15: 5\' 3"  (1.6 m).   Weight as of 08/20/15: 90.6 kg (199 lb 12.8 oz).   Imaging Review Plain radiographs demonstrate severe degenerative joint disease of the left knee(s). The overall alignment is neutral. The bone quality appears to be good for age and reported activity level.  Assessment/Plan:  End stage arthritis, left knee   The patient history, physical examination, clinical judgment of the  provider and imaging studies are consistent with end stage degenerative joint disease of the left knee(s) and total knee arthroplasty is deemed medically necessary. The treatment options including medical management, injection therapy arthroscopy and arthroplasty were discussed at length. The risks and benefits of total knee arthroplasty were presented and reviewed. The risks due to aseptic loosening, infection, stiffness, patella tracking problems, thromboembolic complications and other imponderables were discussed. The patient acknowledged the explanation, agreed to proceed with the plan and consent was signed. Patient is being admitted for inpatient treatment for surgery, pain control, PT, OT, prophylactic antibiotics, VTE prophylaxis, progressive ambulation and ADL's and discharge planning. The patient is planning to be discharged home.    [place] Chemika Nightengale   PA-C  11/23/2015, 3:04 PM

## 2015-11-25 ENCOUNTER — Other Ambulatory Visit: Payer: Self-pay | Admitting: Hematology and Oncology

## 2015-11-25 ENCOUNTER — Encounter (HOSPITAL_COMMUNITY): Payer: Self-pay

## 2015-11-25 ENCOUNTER — Encounter (HOSPITAL_COMMUNITY)
Admission: RE | Admit: 2015-11-25 | Discharge: 2015-11-25 | Disposition: A | Discharge status: Home / Self Care | Payer: Medicare Other | Source: Ambulatory Visit | Attending: Orthopedic Surgery | Admitting: Orthopedic Surgery

## 2015-11-25 DIAGNOSIS — Z0183 Encounter for blood typing: Secondary | ICD-10-CM | POA: Insufficient documentation

## 2015-11-25 DIAGNOSIS — Z01812 Encounter for preprocedural laboratory examination: Secondary | ICD-10-CM | POA: Insufficient documentation

## 2015-11-25 DIAGNOSIS — M1712 Unilateral primary osteoarthritis, left knee: Secondary | ICD-10-CM | POA: Insufficient documentation

## 2015-11-25 DIAGNOSIS — C50412 Malignant neoplasm of upper-outer quadrant of left female breast: Secondary | ICD-10-CM

## 2015-11-25 LAB — CBC
HEMATOCRIT: 39.8 % (ref 36.0–46.0)
Hemoglobin: 13.2 g/dL (ref 12.0–15.0)
MCH: 29.5 pg (ref 26.0–34.0)
MCHC: 33.2 g/dL (ref 30.0–36.0)
MCV: 88.8 fL (ref 78.0–100.0)
Platelets: 281 10*3/uL (ref 150–400)
RBC: 4.48 MIL/uL (ref 3.87–5.11)
RDW: 13.5 % (ref 11.5–15.5)
WBC: 5.2 10*3/uL (ref 4.0–10.5)

## 2015-11-25 LAB — BASIC METABOLIC PANEL
ANION GAP: 8 (ref 5–15)
BUN: 17 mg/dL (ref 6–20)
CO2: 25 mmol/L (ref 22–32)
CREATININE: 0.61 mg/dL (ref 0.44–1.00)
Calcium: 9.5 mg/dL (ref 8.9–10.3)
Chloride: 104 mmol/L (ref 101–111)
GLUCOSE: 94 mg/dL (ref 65–99)
POTASSIUM: 4 mmol/L (ref 3.5–5.1)
SODIUM: 137 mmol/L (ref 135–145)

## 2015-11-25 LAB — SURGICAL PCR SCREEN
MRSA, PCR: NEGATIVE
STAPHYLOCOCCUS AUREUS: POSITIVE — AB

## 2015-11-29 NOTE — Progress Notes (Signed)
Cardiology Office Note    Date:  11/30/2015   ID:  Tammy Cooper, DOB October 31, 1950, MRN [id number]  PCP:  Lynne Logan, MD  Cardiologist:  Fransico Him, MD   Chief Complaint  Patient presents with  . Hypertension  . Hyperlipidemia    History of Present Illness:  Tammy Cooper is a 65 y.o. female with a history of HTN and dyslipidemia who presents today for followup. She is doing well.She is having a knee replacement tomorrow.   She denies any chest pain, SOB, DOE, LE edema, dizziness, palpitations or syncope    Past Medical History:  Diagnosis Date  . Anxiety   . Arthritis   . Breast cancer ([place]) 05/28/14   Left breast in situ   . Complication of anesthesia   . Constipation    occasiopnal  . History of breast cancer 2016  . History of recurrent cystitis   . Hypercholesteremia   . Hypertension   . PONV (postoperative nausea and vomiting)    "a little nausea"  . Spinal stenosis   . Stroke [facility])    H/O TIA in 2006, MRA clear,arteriogram was done which was normal    Past Surgical History:  Procedure Laterality Date  . anal skin tag removal  10/21/10  . BACK SURGERY  07/12/2010   stenosis L4 + L5  . BLADDER TACK     WITH THE HYSTERECTOMY  . BREAST LUMPECTOMY WITH RADIOACTIVE SEED LOCALIZATION Left 07/31/2014   Procedure: LEFT BREAST LUMPECTOMY WITH RADIOACTIVE SEED LOCALIZATION;  Surgeon: Coralie Keens, MD;  Location: [facility];  Service: General;  Laterality: Left;  . COLONOSCOPY    . HEMORROIDECTOMY    . KNEE ARTHROSCOPY  2010   left  . ROTATOR CUFF REPAIR  2005   rt shoulder  . TONSILLECTOMY    . TOTAL KNEE ARTHROPLASTY Right 08/04/2015   Procedure: RIGHT TOTAL KNEE ARTHROPLASTY;  Surgeon: Paralee Cancel, MD;  Location[facility];  Service: Orthopedics;  Laterality: Right;  [name] VAGINAL HYSTERECTOMY  2003   total hyst-BSO-Ant/post repair    Current Medications: Outpatient Medications Prior to Visit  Medication Sig Dispense  Refill  . anastrozole (ARIMIDEX) 1 MG tablet TAKE 1 TABLET EACH DAY. 90 tablet 3  . bisacodyl (DULCOLAX) 5 MG EC tablet Take 5 mg by mouth daily as needed for mild constipation or moderate constipation.    [name] Co-Enzyme Q10 200 MG CAPS Take 300 mg by mouth daily.     . Multiple Vitamin (MULTIVITAMIN WITH MINERALS) TABS tablet Take 1 tablet by mouth daily.    . rosuvastatin (CRESTOR) 10 MG tablet Take 1 tablet (10 mg total) by mouth daily. 90 tablet 3  . telmisartan-hydrochlorothiazide (MICARDIS HCT) 40-12.5 MG tablet Take 1 tablet by mouth daily. 90 tablet 1  . venlafaxine XR (EFFEXOR-XR) 37.5 MG 24 hr capsule TAKE 1 CAPSULE IN THE MORNING WITH BREAKFAST. 90 capsule 3  . ZETIA 10 MG tablet TAKE 1 TABLET ONCE DAILY. 90 tablet 3  . aspirin 81 MG tablet Take 81 mg by mouth daily.    [name] tiZANidine (ZANAFLEX) 4 MG tablet Take 1 tablet (4 mg total) by mouth every 6 (six) hours as needed for muscle spasms. (Patient not taking: Reported on 11/30/2015) 40 tablet 0   No facility-administered medications prior to visit.      Allergies:   Lipitor [atorvastatin]   Social History   Social History  . Marital status: Married    Spouse name: N/A  . Number  of children: N/A  . Years of education: N/A   Social History Main Topics  . Smoking status: Never Smoker  . Smokeless tobacco: Never Used  . Alcohol use Yes     Comment: ocassionally  . Drug use: No  . Sexual activity: Yes   Other Topics Concern  . None   Social History Narrative  . None     Family History:  The patient's family history includes Cancer in her father; Hypertension in her mother and sister.   ROS:   Please see the history of present illness.    ROS All other systems reviewed and are negative.   PHYSICAL EXAM:   VS:  BP 132/76   Pulse 68   Ht 5\' 3"  (1.6 m)   Wt 202 lb 12.8 oz (92 kg)   SpO2 97%   BMI 35.92 kg/m    GEN: Well nourished, well developed, in no acute distress  HEENT: normal  Neck: no JVD, carotid bruits,  or masses Cardiac: RRR; no murmurs, rubs, or gallops,no edema.  Intact distal pulses bilaterally.  Respiratory:  clear to auscultation bilaterally, normal work of breathing GI: soft, nontender, nondistended, + BS MS: no deformity or atrophy  Skin: warm and dry, no rash Neuro:  Alert and Oriented x 3, Strength and sensation are intact Psych: euthymic mood, full affect  Wt Readings from Last 3 Encounters:  11/30/15 202 lb 12.8 oz (92 kg)  11/25/15 201 lb (91.2 kg)  08/20/15 199 lb 12.8 oz (90.6 kg)      Studies/Labs Reviewed:   EKG:  EKG is not ordered today.    Recent Labs: 09/07/2015: ALT 21 11/25/2015: BUN 17; Creatinine, Ser 0.61; Hemoglobin 13.2; Platelets 281; Potassium 4.0; Sodium 137   Lipid Panel    Component Value Date/Time   CHOL 140 10/23/2015 0734   CHOL 160 09/02/2014 0730   TRIG 139 10/23/2015 0734   TRIG 206 (H) 09/02/2014 0730   HDL 53 10/23/2015 0734   HDL 55 09/02/2014 0730   CHOLHDL 2.6 10/23/2015 0734   CHOLHDL 3.1 CALC 05/29/2006 1013   VLDL 24 05/29/2006 1013   LDLCALC 59 10/23/2015 0734   LDLCALC 64 09/02/2014 0730    Additional studies/ records that were reviewed today include:  none    ASSESSMENT:    1. Essential hypertension   2. Hyperlipidemia      PLAN:  In order of problems listed above:  1. HTN - Bp controlled on current medical regimen.  Continue Micardis HCT 2. Hyperlipidemia - continue statin and zetia.  Her lipids are at goal.    Medication Adjustments/Labs and Tests Ordered: Current medicines are reviewed at length with the patient today.  Concerns regarding medicines are outlined above.  Medication changes, Labs and Tests ordered today are listed in the Patient Instructions below.  There are no Patient Instructions on file for this visit.   Signed, Fransico Him, MD  11/30/2015 11:27 AM    [address], [place], [place]  [address] Phone: [phone]; Fax: [phone]

## 2015-11-30 ENCOUNTER — Ambulatory Visit (INDEPENDENT_AMBULATORY_CARE_PROVIDER_SITE_OTHER): Payer: Medicare Other | Admitting: Cardiology

## 2015-11-30 ENCOUNTER — Encounter: Payer: Self-pay | Admitting: Cardiology

## 2015-11-30 VITALS — BP 132/76 | HR 68 | Ht 63.0 in | Wt 202.8 lb

## 2015-11-30 DIAGNOSIS — I10 Essential (primary) hypertension: Secondary | ICD-10-CM

## 2015-11-30 DIAGNOSIS — E785 Hyperlipidemia, unspecified: Secondary | ICD-10-CM | POA: Diagnosis not present

## 2015-11-30 NOTE — Patient Instructions (Signed)

## 2015-12-01 ENCOUNTER — Inpatient Hospital Stay (HOSPITAL_COMMUNITY): Payer: Medicare Other | Admitting: Anesthesiology

## 2015-12-01 ENCOUNTER — Encounter (HOSPITAL_COMMUNITY)
Admission: RE | Disposition: A | Discharge status: Home Health | Payer: Self-pay | Source: Ambulatory Visit | Attending: Orthopedic Surgery

## 2015-12-01 ENCOUNTER — Encounter (HOSPITAL_COMMUNITY): Payer: Self-pay | Admitting: *Deleted

## 2015-12-01 ENCOUNTER — Inpatient Hospital Stay (HOSPITAL_COMMUNITY)
Admission: RE | Admit: 2015-12-01 | Discharge: 2015-12-02 | DRG: 470 | Disposition: A | Discharge status: Home Health | Payer: Medicare Other | Source: Ambulatory Visit | Attending: Orthopedic Surgery | Admitting: Orthopedic Surgery

## 2015-12-01 DIAGNOSIS — Z6835 Body mass index (BMI) 35.0-35.9, adult: Secondary | ICD-10-CM

## 2015-12-01 DIAGNOSIS — Z96659 Presence of unspecified artificial knee joint: Secondary | ICD-10-CM

## 2015-12-01 DIAGNOSIS — I10 Essential (primary) hypertension: Secondary | ICD-10-CM | POA: Diagnosis present

## 2015-12-01 DIAGNOSIS — Z96651 Presence of right artificial knee joint: Secondary | ICD-10-CM | POA: Diagnosis present

## 2015-12-01 DIAGNOSIS — F418 Other specified anxiety disorders: Secondary | ICD-10-CM | POA: Diagnosis present

## 2015-12-01 DIAGNOSIS — Z96652 Presence of left artificial knee joint: Secondary | ICD-10-CM

## 2015-12-01 DIAGNOSIS — E7800 Pure hypercholesterolemia, unspecified: Secondary | ICD-10-CM | POA: Diagnosis present

## 2015-12-01 DIAGNOSIS — Z853 Personal history of malignant neoplasm of breast: Secondary | ICD-10-CM

## 2015-12-01 DIAGNOSIS — Z8673 Personal history of transient ischemic attack (TIA), and cerebral infarction without residual deficits: Secondary | ICD-10-CM | POA: Diagnosis not present

## 2015-12-01 DIAGNOSIS — M1712 Unilateral primary osteoarthritis, left knee: Secondary | ICD-10-CM | POA: Diagnosis present

## 2015-12-01 DIAGNOSIS — M659 Synovitis and tenosynovitis, unspecified: Secondary | ICD-10-CM | POA: Diagnosis present

## 2015-12-01 DIAGNOSIS — F329 Major depressive disorder, single episode, unspecified: Secondary | ICD-10-CM | POA: Diagnosis present

## 2015-12-01 DIAGNOSIS — M25562 Pain in left knee: Secondary | ICD-10-CM | POA: Diagnosis present

## 2015-12-01 DIAGNOSIS — E669 Obesity, unspecified: Secondary | ICD-10-CM | POA: Diagnosis present

## 2015-12-01 HISTORY — PX: TOTAL KNEE ARTHROPLASTY: SHX125

## 2015-12-01 LAB — TYPE AND SCREEN
ABO/RH(D): O POS
ANTIBODY SCREEN: NEGATIVE

## 2015-12-01 SURGERY — ARTHROPLASTY, KNEE, TOTAL
Anesthesia: Monitor Anesthesia Care | Site: Knee | Laterality: Left

## 2015-12-01 MED ORDER — MIDAZOLAM HCL 5 MG/5ML IJ SOLN
INTRAMUSCULAR | Status: DC | PRN
  Administered 2015-12-01: 2 mg via INTRAVENOUS

## 2015-12-01 MED ORDER — BUPIVACAINE-EPINEPHRINE (PF) 0.25% -1:200000 IJ SOLN
INTRAMUSCULAR | Status: AC
  Filled 2015-12-01: qty 30

## 2015-12-01 MED ORDER — DIPHENHYDRAMINE HCL 25 MG PO CAPS
25.0000 mg | ORAL_CAPSULE | Freq: Four times a day (QID) | ORAL | Status: DC | PRN
  Administered 2015-12-01 – 2015-12-02 (×2): 25 mg via ORAL
  Filled 2015-12-01 (×2): qty 1

## 2015-12-01 MED ORDER — TRANEXAMIC ACID 1000 MG/10ML IV SOLN
1000.0000 mg | Freq: Once | INTRAVENOUS | Status: AC
  Administered 2015-12-01: 1000 mg via INTRAVENOUS
  Filled 2015-12-01: qty 10

## 2015-12-01 MED ORDER — DEXAMETHASONE SODIUM PHOSPHATE 10 MG/ML IJ SOLN
INTRAMUSCULAR | Status: AC
  Filled 2015-12-01: qty 1

## 2015-12-01 MED ORDER — BUPIVACAINE-EPINEPHRINE (PF) 0.25% -1:200000 IJ SOLN
INTRAMUSCULAR | Status: DC | PRN
  Administered 2015-12-01: 30 mL

## 2015-12-01 MED ORDER — HYDROCHLOROTHIAZIDE 12.5 MG PO CAPS
12.5000 mg | ORAL_CAPSULE | Freq: Every day | ORAL | Status: DC
  Filled 2015-12-01 (×2): qty 1

## 2015-12-01 MED ORDER — METOCLOPRAMIDE HCL 5 MG/ML IJ SOLN: 5.0000 mg | Freq: Three times a day (TID) | INTRAMUSCULAR | Status: DC | PRN

## 2015-12-01 MED ORDER — ONDANSETRON HCL 4 MG/2ML IJ SOLN: 4.0000 mg | Freq: Four times a day (QID) | INTRAMUSCULAR | Status: DC | PRN

## 2015-12-01 MED ORDER — FENTANYL CITRATE (PF) 100 MCG/2ML IJ SOLN
INTRAMUSCULAR | Status: AC
  Filled 2015-12-01: qty 2

## 2015-12-01 MED ORDER — LIDOCAINE HCL (CARDIAC) 20 MG/ML IV SOLN
INTRAVENOUS | Status: DC | PRN
  Administered 2015-12-01: 50 mg via INTRAVENOUS

## 2015-12-01 MED ORDER — METHOCARBAMOL 500 MG PO TABS
500.0000 mg | ORAL_TABLET | Freq: Four times a day (QID) | ORAL | Status: DC | PRN
  Administered 2015-12-01 – 2015-12-02 (×3): 500 mg via ORAL
  Filled 2015-12-01 (×3): qty 1

## 2015-12-01 MED ORDER — HYDROMORPHONE HCL 1 MG/ML IJ SOLN: 0.2500 mg | INTRAMUSCULAR | Status: DC | PRN

## 2015-12-01 MED ORDER — KETOROLAC TROMETHAMINE 30 MG/ML IJ SOLN
INTRAMUSCULAR | Status: DC | PRN
  Administered 2015-12-01: 30 mg

## 2015-12-01 MED ORDER — ASPIRIN 81 MG PO CHEW
81.0000 mg | CHEWABLE_TABLET | Freq: Two times a day (BID) | ORAL | Status: DC
  Administered 2015-12-01 – 2015-12-02 (×2): 81 mg via ORAL
  Filled 2015-12-01 (×2): qty 1

## 2015-12-01 MED ORDER — DEXAMETHASONE SODIUM PHOSPHATE 10 MG/ML IJ SOLN
10.0000 mg | Freq: Once | INTRAMUSCULAR | Status: AC
  Administered 2015-12-02: 10 mg via INTRAVENOUS
  Filled 2015-12-01: qty 1

## 2015-12-01 MED ORDER — PHENYLEPHRINE HCL 10 MG/ML IJ SOLN
INTRAMUSCULAR | Status: DC | PRN
  Administered 2015-12-01: 80 ug via INTRAVENOUS
  Administered 2015-12-01: 120 ug via INTRAVENOUS
  Administered 2015-12-01: 80 ug via INTRAVENOUS
  Administered 2015-12-01: 120 ug via INTRAVENOUS
  Administered 2015-12-01: 80 ug via INTRAVENOUS
  Administered 2015-12-01: 120 ug via INTRAVENOUS
  Administered 2015-12-01: 40 ug via INTRAVENOUS
  Administered 2015-12-01 (×2): 80 ug via INTRAVENOUS

## 2015-12-01 MED ORDER — TELMISARTAN-HCTZ 40-12.5 MG PO TABS: 1.0000 | ORAL_TABLET | Freq: Every day | ORAL | Status: DC

## 2015-12-01 MED ORDER — ONDANSETRON HCL 4 MG/2ML IJ SOLN
INTRAMUSCULAR | Status: AC
  Filled 2015-12-01: qty 2

## 2015-12-01 MED ORDER — CHLORHEXIDINE GLUCONATE 4 % EX LIQD: 60.0000 mL | Freq: Once | CUTANEOUS | Status: DC

## 2015-12-01 MED ORDER — PROMETHAZINE HCL 25 MG/ML IJ SOLN: 6.2500 mg | INTRAMUSCULAR | Status: DC | PRN

## 2015-12-01 MED ORDER — ONDANSETRON HCL 4 MG PO TABS: 4.0000 mg | ORAL_TABLET | Freq: Four times a day (QID) | ORAL | Status: DC | PRN

## 2015-12-01 MED ORDER — 0.9 % SODIUM CHLORIDE (POUR BTL) OPTIME
TOPICAL | Status: DC | PRN
  Administered 2015-12-01: 1000 mL

## 2015-12-01 MED ORDER — ANASTROZOLE 1 MG PO TABS
1.0000 mg | ORAL_TABLET | Freq: Every day | ORAL | Status: DC
  Administered 2015-12-02: 1 mg via ORAL
  Filled 2015-12-01: qty 1

## 2015-12-01 MED ORDER — SODIUM CHLORIDE 0.9 % IV SOLN
INTRAVENOUS | Status: DC
  Administered 2015-12-01: 21:00:00 via INTRAVENOUS
  Filled 2015-12-01 (×3): qty 1000

## 2015-12-01 MED ORDER — PHENOL 1.4 % MT LIQD
1.0000 | OROMUCOSAL | Status: DC | PRN
  Filled 2015-12-01: qty 177

## 2015-12-01 MED ORDER — MENTHOL 3 MG MT LOZG: 1.0000 | LOZENGE | OROMUCOSAL | Status: DC | PRN

## 2015-12-01 MED ORDER — MAGNESIUM CITRATE PO SOLN: 1.0000 | Freq: Once | ORAL | Status: DC | PRN

## 2015-12-01 MED ORDER — EZETIMIBE 10 MG PO TABS
10.0000 mg | ORAL_TABLET | Freq: Every day | ORAL | Status: DC
  Administered 2015-12-01 – 2015-12-02 (×2): 10 mg via ORAL
  Filled 2015-12-01 (×2): qty 1

## 2015-12-01 MED ORDER — FENTANYL CITRATE (PF) 100 MCG/2ML IJ SOLN
INTRAMUSCULAR | Status: DC | PRN
  Administered 2015-12-01 (×2): 50 ug via INTRAVENOUS

## 2015-12-01 MED ORDER — HYDROCODONE-ACETAMINOPHEN 7.5-325 MG PO TABS
1.0000 | ORAL_TABLET | ORAL | Status: DC
  Administered 2015-12-01: 2 via ORAL
  Administered 2015-12-01 (×2): 1 via ORAL
  Administered 2015-12-02 (×3): 2 via ORAL
  Filled 2015-12-01: qty 1
  Filled 2015-12-01 (×2): qty 2
  Filled 2015-12-01: qty 1
  Filled 2015-12-01 (×2): qty 2

## 2015-12-01 MED ORDER — METOCLOPRAMIDE HCL 5 MG PO TABS: 5.0000 mg | ORAL_TABLET | Freq: Three times a day (TID) | ORAL | Status: DC | PRN

## 2015-12-01 MED ORDER — TRANEXAMIC ACID 1000 MG/10ML IV SOLN
1000.0000 mg | INTRAVENOUS | Status: AC
  Administered 2015-12-01: 1000 mg via INTRAVENOUS
  Filled 2015-12-01: qty 1100

## 2015-12-01 MED ORDER — FERROUS SULFATE 325 (65 FE) MG PO TABS
325.0000 mg | ORAL_TABLET | Freq: Three times a day (TID) | ORAL | Status: DC
  Administered 2015-12-01 – 2015-12-02 (×3): 325 mg via ORAL
  Filled 2015-12-01 (×3): qty 1

## 2015-12-01 MED ORDER — SODIUM CHLORIDE 0.9 % IJ SOLN
INTRAMUSCULAR | Status: AC
  Filled 2015-12-01: qty 50

## 2015-12-01 MED ORDER — PROPOFOL 500 MG/50ML IV EMUL
INTRAVENOUS | Status: DC | PRN
  Administered 2015-12-01: 100 ug/kg/min via INTRAVENOUS

## 2015-12-01 MED ORDER — CEFAZOLIN SODIUM-DEXTROSE 2-4 GM/100ML-% IV SOLN
2.0000 g | INTRAVENOUS | Status: AC
  Administered 2015-12-01: 2 g via INTRAVENOUS
  Filled 2015-12-01: qty 100

## 2015-12-01 MED ORDER — LIDOCAINE HCL (CARDIAC) 20 MG/ML IV SOLN
INTRAVENOUS | Status: AC
  Filled 2015-12-01: qty 5

## 2015-12-01 MED ORDER — SODIUM CHLORIDE 0.9 % IJ SOLN
INTRAMUSCULAR | Status: AC
  Filled 2015-12-01: qty 10

## 2015-12-01 MED ORDER — POLYETHYLENE GLYCOL 3350 17 G PO PACK
17.0000 g | PACK | Freq: Two times a day (BID) | ORAL | Status: DC
  Administered 2015-12-01 – 2015-12-02 (×2): 17 g via ORAL
  Filled 2015-12-01 (×2): qty 1

## 2015-12-01 MED ORDER — EPHEDRINE SULFATE 50 MG/ML IJ SOLN
INTRAMUSCULAR | Status: DC | PRN
  Administered 2015-12-01: 10 mg via INTRAVENOUS
  Administered 2015-12-01 (×4): 5 mg via INTRAVENOUS
  Administered 2015-12-01: 10 mg via INTRAVENOUS

## 2015-12-01 MED ORDER — VENLAFAXINE HCL ER 37.5 MG PO CP24
37.5000 mg | ORAL_CAPSULE | Freq: Every day | ORAL | Status: DC
  Administered 2015-12-02: 37.5 mg via ORAL
  Filled 2015-12-01: qty 1

## 2015-12-01 MED ORDER — DOCUSATE SODIUM 100 MG PO CAPS
100.0000 mg | ORAL_CAPSULE | Freq: Two times a day (BID) | ORAL | Status: DC
  Administered 2015-12-01 – 2015-12-02 (×2): 100 mg via ORAL
  Filled 2015-12-01 (×2): qty 1

## 2015-12-01 MED ORDER — PHENYLEPHRINE 40 MCG/ML (10ML) SYRINGE FOR IV PUSH (FOR BLOOD PRESSURE SUPPORT)
PREFILLED_SYRINGE | INTRAVENOUS | Status: AC
  Filled 2015-12-01: qty 10

## 2015-12-01 MED ORDER — SODIUM CHLORIDE 0.9 % IJ SOLN
INTRAMUSCULAR | Status: DC | PRN
  Administered 2015-12-01: 30 mL

## 2015-12-01 MED ORDER — ROSUVASTATIN CALCIUM 5 MG PO TABS
10.0000 mg | ORAL_TABLET | Freq: Every day | ORAL | Status: DC
  Administered 2015-12-01 – 2015-12-02 (×2): 10 mg via ORAL
  Filled 2015-12-01 (×2): qty 2

## 2015-12-01 MED ORDER — BISACODYL 10 MG RE SUPP
10.0000 mg | Freq: Every day | RECTAL | Status: DC | PRN
Start: 2015-12-01 — End: 2015-12-02

## 2015-12-01 MED ORDER — METHOCARBAMOL 1000 MG/10ML IJ SOLN
500.0000 mg | Freq: Four times a day (QID) | INTRAVENOUS | Status: DC | PRN
  Administered 2015-12-01: 500 mg via INTRAVENOUS
  Filled 2015-12-01: qty 550
  Filled 2015-12-01: qty 5

## 2015-12-01 MED ORDER — SODIUM CHLORIDE 0.9 % IR SOLN
Status: DC | PRN
  Administered 2015-12-01: 1000 mL

## 2015-12-01 MED ORDER — PROPOFOL 10 MG/ML IV BOLUS
INTRAVENOUS | Status: AC
  Filled 2015-12-01: qty 60

## 2015-12-01 MED ORDER — STERILE WATER FOR IRRIGATION IR SOLN
Status: DC | PRN
  Administered 2015-12-01: 2000 mL

## 2015-12-01 MED ORDER — PROPOFOL 10 MG/ML IV BOLUS
INTRAVENOUS | Status: DC | PRN
  Administered 2015-12-01: 10 mg via INTRAVENOUS
  Administered 2015-12-01: 20 mg via INTRAVENOUS
  Administered 2015-12-01 (×2): 10 mg via INTRAVENOUS

## 2015-12-01 MED ORDER — ALUM & MAG HYDROXIDE-SIMETH 200-200-20 MG/5ML PO SUSP: 30.0000 mL | ORAL | Status: DC | PRN

## 2015-12-01 MED ORDER — EPHEDRINE SULFATE 50 MG/ML IJ SOLN
INTRAMUSCULAR | Status: AC
  Filled 2015-12-01: qty 1

## 2015-12-01 MED ORDER — LACTATED RINGERS IV SOLN
INTRAVENOUS | Status: DC | PRN
  Administered 2015-12-01 (×3): via INTRAVENOUS

## 2015-12-01 MED ORDER — IRBESARTAN 150 MG PO TABS
150.0000 mg | ORAL_TABLET | Freq: Every day | ORAL | Status: DC
  Filled 2015-12-01 (×2): qty 1

## 2015-12-01 MED ORDER — CEFAZOLIN SODIUM-DEXTROSE 2-4 GM/100ML-% IV SOLN
2.0000 g | Freq: Four times a day (QID) | INTRAVENOUS | Status: AC
  Administered 2015-12-01 – 2015-12-02 (×2): 2 g via INTRAVENOUS
  Filled 2015-12-01 (×2): qty 100

## 2015-12-01 MED ORDER — CEFAZOLIN SODIUM-DEXTROSE 2-4 GM/100ML-% IV SOLN
INTRAVENOUS | Status: AC
  Filled 2015-12-01: qty 100

## 2015-12-01 MED ORDER — PROPOFOL 10 MG/ML IV BOLUS
INTRAVENOUS | Status: AC
  Filled 2015-12-01: qty 20

## 2015-12-01 MED ORDER — ONDANSETRON HCL 4 MG/2ML IJ SOLN
INTRAMUSCULAR | Status: DC | PRN
  Administered 2015-12-01: 4 mg via INTRAVENOUS

## 2015-12-01 MED ORDER — KETOROLAC TROMETHAMINE 30 MG/ML IJ SOLN
INTRAMUSCULAR | Status: AC
  Filled 2015-12-01: qty 1

## 2015-12-01 MED ORDER — DEXAMETHASONE SODIUM PHOSPHATE 10 MG/ML IJ SOLN
10.0000 mg | Freq: Once | INTRAMUSCULAR | Status: AC
  Administered 2015-12-01: 10 mg via INTRAVENOUS

## 2015-12-01 MED ORDER — HYDROMORPHONE HCL 1 MG/ML IJ SOLN
0.5000 mg | INTRAMUSCULAR | Status: DC | PRN
  Administered 2015-12-02: 1 mg via INTRAVENOUS
  Filled 2015-12-01: qty 1

## 2015-12-01 MED ORDER — MIDAZOLAM HCL 2 MG/2ML IJ SOLN
INTRAMUSCULAR | Status: AC
  Filled 2015-12-01: qty 2

## 2015-12-01 MED ORDER — CELECOXIB 200 MG PO CAPS
200.0000 mg | ORAL_CAPSULE | Freq: Two times a day (BID) | ORAL | Status: DC
  Administered 2015-12-01 – 2015-12-02 (×2): 200 mg via ORAL
  Filled 2015-12-01 (×2): qty 1

## 2015-12-01 SURGICAL SUPPLY — 43 items
BAG SPEC THK2 15X12 ZIP CLS (MISCELLANEOUS) ×1
BAG ZIPLOCK 12X15 (MISCELLANEOUS) ×2 IMPLANT
BANDAGE ACE 6X5 VEL STRL LF (GAUZE/BANDAGES/DRESSINGS) ×3 IMPLANT
BLADE SAW SGTL 13.0X1.19X90.0M (BLADE) ×3 IMPLANT
BOWL SMART MIX CTS (DISPOSABLE) ×3 IMPLANT
CAPT KNEE TOTAL 3 ATTUNE ×2 IMPLANT
CEMENT HV SMART SET (Cement) ×4 IMPLANT
CLOTH BEACON ORANGE TIMEOUT ST (SAFETY) ×3 IMPLANT
CUFF TOURN SGL QUICK 34 (TOURNIQUET CUFF) ×3
CUFF TRNQT CYL 34X4X40X1 (TOURNIQUET CUFF) ×1 IMPLANT
DECANTER SPIKE VIAL GLASS SM (MISCELLANEOUS) ×3 IMPLANT
DRAPE U-SHAPE 47X51 STRL (DRAPES) ×3 IMPLANT
DRESSING AQUACEL AG SP 3.5X10 (GAUZE/BANDAGES/DRESSINGS) ×1 IMPLANT
DRSG AQUACEL AG SP 3.5X10 (GAUZE/BANDAGES/DRESSINGS) ×3
DURAPREP 26ML APPLICATOR (WOUND CARE) ×6 IMPLANT
ELECT REM PT RETURN 9FT ADLT (ELECTROSURGICAL) ×3
ELECTRODE REM PT RTRN 9FT ADLT (ELECTROSURGICAL) ×1 IMPLANT
GLOVE BIOGEL PI IND STRL 7.0 (GLOVE) IMPLANT
GLOVE BIOGEL PI IND STRL 7.5 (GLOVE) ×1 IMPLANT
GLOVE BIOGEL PI IND STRL 8.5 (GLOVE) ×1 IMPLANT
GLOVE BIOGEL PI INDICATOR 7.0 (GLOVE) ×4
GLOVE BIOGEL PI INDICATOR 7.5 (GLOVE) ×8
GLOVE BIOGEL PI INDICATOR 8.5 (GLOVE) ×4
GLOVE ECLIPSE 8.0 STRL XLNG CF (GLOVE) ×3 IMPLANT
GLOVE ORTHO TXT STRL SZ7.5 (GLOVE) ×6 IMPLANT
GLOVE SURG SS PI 7.5 STRL IVOR (GLOVE) ×2 IMPLANT
GOWN STRL REUS W/TWL LRG LVL3 (GOWN DISPOSABLE) ×3 IMPLANT
GOWN STRL REUS W/TWL XL LVL3 (GOWN DISPOSABLE) ×7 IMPLANT
HANDPIECE INTERPULSE COAX TIP (DISPOSABLE) ×3
LIQUID BAND (GAUZE/BANDAGES/DRESSINGS) ×3 IMPLANT
MANIFOLD NEPTUNE II (INSTRUMENTS) ×3 IMPLANT
PACK TOTAL KNEE CUSTOM (KITS) ×3 IMPLANT
POSITIONER SURGICAL ARM (MISCELLANEOUS) ×3 IMPLANT
SET HNDPC FAN SPRY TIP SCT (DISPOSABLE) ×1 IMPLANT
SET PAD KNEE POSITIONER (MISCELLANEOUS) ×3 IMPLANT
SUT MNCRL AB 4-0 PS2 18 (SUTURE) ×3 IMPLANT
SUT VIC AB 1 CT1 36 (SUTURE) ×3 IMPLANT
SUT VIC AB 2-0 CT1 27 (SUTURE) ×9
SUT VIC AB 2-0 CT1 TAPERPNT 27 (SUTURE) ×3 IMPLANT
SUT VLOC 180 0 24IN GS25 (SUTURE) ×3 IMPLANT
TRAY FOLEY W/METER SILVER 14FR (SET/KITS/TRAYS/PACK) ×3 IMPLANT
WRAP KNEE MAXI GEL POST OP (GAUZE/BANDAGES/DRESSINGS) ×3 IMPLANT
YANKAUER SUCT BULB TIP 10FT TU (MISCELLANEOUS) ×3 IMPLANT

## 2015-12-01 NOTE — Anesthesia Postprocedure Evaluation (Signed)
Anesthesia Post Note  Patient: Tammy Cooper  Procedure(s) Performed: Procedure(s) (LRB): LEFT TOTAL KNEE ARTHROPLASTY (Left)  Patient location during evaluation: PACU Anesthesia Type: Spinal and MAC Level of consciousness: awake and alert Pain management: pain level controlled Vital Signs Assessment: post-procedure vital signs reviewed and stable Respiratory status: spontaneous breathing and respiratory function stable Cardiovascular status: blood pressure returned to baseline and stable Postop Assessment: spinal receding Anesthetic complications: no    Last Vitals:  Vitals:   12/01/15 1620 12/01/15 1721  BP: (!) 125/50 (!) 116/42  Pulse: 83 90  Resp: 16 16  Temp: 36.5 C 36.6 C    Last Pain:  Vitals:   12/01/15 1721  TempSrc: Oral  PainSc:                  Tiajuana Amass

## 2015-12-01 NOTE — Transfer of Care (Signed)
Immediate Anesthesia Transfer of Care Note  Patient: Tammy Cooper  Procedure(s) Performed: Procedure(s): LEFT TOTAL KNEE ARTHROPLASTY (Left)  Patient Location: PACU  Anesthesia Type:Spinal  Level of Consciousness:  sedated, patient cooperative and responds to stimulation  Airway & Oxygen Therapy:Patient Spontanous Breathing and Patient connected to face mask oxgen  Post-op Assessment:  Report given to PACU RN and Post -op Vital signs reviewed and stable  Post vital signs:  Reviewed and stable  Last Vitals:  Vitals:   12/01/15 1008  BP: (!) 152/67  Pulse: 66  Resp: 16  Temp: [id number] C    Complications: No apparent anesthesia complications

## 2015-12-01 NOTE — Anesthesia Procedure Notes (Signed)
Spinal  Patient location during procedure: OR Staffing Anesthesiologist: Suzette Battiest Performed: anesthesiologist  Preanesthetic Checklist Completed: patient identified, site marked, surgical consent, pre-op evaluation, timeout performed, IV checked, risks and benefits discussed and monitors and equipment checked Spinal Block Patient position: sitting Prep: ChloraPrep Patient monitoring: heart rate, continuous pulse ox and blood pressure Location: L4-5 Injection technique: single-shot Needle Needle type: Pencan  Needle gauge: 24 G Needle length: 9 cm Additional Notes Expiration date of kit checked and confirmed. Patient tolerated procedure well, without complications.

## 2015-12-01 NOTE — Op Note (Signed)
NAME:  Tammy Cooper [facility]                      MEDICAL RECORD NO.:  [id number]                             FACILITY:  [facility]      PHYSICIAN:  Pietro Cassis. Alvan Dame, M.D.  DATE OF BIRTH:  Apr 13, 1951      DATE OF PROCEDURE:  12/01/2015                                     OPERATIVE REPORT         PREOPERATIVE DIAGNOSIS:  Left knee osteoarthritis.      POSTOPERATIVE DIAGNOSIS:  Left knee osteoarthritis.      FINDINGS:  The patient was noted to have complete loss of cartilage and   bone-on-bone arthritis with associated osteophytes in the medial and patellofemoral compartments of   the knee with a significant synovitis and associated effusion.      PROCEDURE:  Left total knee replacement.      COMPONENTS USED:  DePuy Attunerotating platform posterior stabilized knee   system, a size 5N femur, 4 tibia, size 7 mm PS AOX insert, and 35 anatomic patellar   button.      SURGEON:  Pietro Cassis. Alvan Dame, M.D.      ASSISTANT:  Danae Orleans, PA-C.      ANESTHESIA:  Spinal.      SPECIMENS:  None.      COMPLICATION:  None.      DRAINS: None.  EBL: <100cc      TOURNIQUET TIME:   Total Tourniquet Time Documented: Thigh (Left) - 29 minutes Total: Thigh (Left) - 29 minutes  .      The patient was stable to the recovery room.      INDICATION FOR PROCEDURE:  Tammy Cooper is a 65 y.o. female patient of   mine.  The patient had been seen, evaluated, and treated conservatively in the   office with medication, activity modification, and injections.  The patient had   radiographic changes of bone-on-bone arthritis with endplate sclerosis and osteophytes noted.      The patient failed conservative measures including medication, injections, and activity modification, and at this point was ready for more definitive measures.   Based on the radiographic changes and failed conservative measures, the patient   decided to proceed with total knee replacement.  Risks of infection,   DVT,  component failure, need for revision surgery, postop course, and   expectations were all   discussed and reviewed.  Consent was obtained for benefit of pain   relief.      PROCEDURE IN DETAIL:  The patient was brought to the operative theater.   Once adequate anesthesia, preoperative antibiotics, 2 gm of Ancef, 1 gm of Tranexamic Acid, and 10 mg of Decadron administered, the patient was positioned supine with the left thigh tourniquet placed.  The  left lower extremity was prepped and draped in sterile fashion.  A time-   out was performed identifying the patient, planned procedure, and   extremity.      The left lower extremity was placed in the [facility] leg holder.  The leg was   exsanguinated, tourniquet elevated to 250 mmHg.  A midline incision was   made followed by  median parapatellar arthrotomy.  Following initial   exposure, attention was first directed to the patella.  Precut   measurement was noted to be 22 mm.  I resected down to 14 mm and used a   35 patellar button to restore patellar height as well as cover the cut   surface.      The lug holes were drilled and a metal shim was placed to protect the   patella from retractors and saw blades.      At this point, attention was now directed to the femur.  The femoral   canal was opened with a drill, irrigated to try to prevent fat emboli.  An   intramedullary rod was passed at 3 degrees valgus, 9 mm of bone was   resected off the distal femur.  Following this resection, the tibia was   subluxated anteriorly.  Using the extramedullary guide, 2 mm of bone was resected off   the proximal medial tibia.  We confirmed the gap would be   stable medially and laterally with a size 5 mm insert as well as confirmed   the cut was perpendicular in the coronal plane, checking with an alignment rod.      Once this was done, I sized the femur to be a size 5 in the anterior-   posterior dimension, chose a narrow component based on medial and    lateral dimension.  The size 5 rotation block was then pinned in   position anterior referenced using the C-clamp to set rotation.  The   anterior, posterior, and  chamfer cuts were made without difficulty nor   notching making certain that I was along the anterior cortex to help   with flexion gap stability.      The final box cut was made off the lateral aspect of distal femur.      At this point, the tibia was sized to be a size 4, the size 4 tray was   then pinned in position through the medial third of the tubercle,   drilled, and keel punched.  Trial reduction was now carried with a 5 femur,  4 tibia, a size 7 mm PS insert, and the 35 anatomic patella botton.  The knee was brought to   extension, full extension with good flexion stability with the patella   tracking through the trochlea without application of pressure.  Given   all these findings the femoral lug holes were drilled and then the trial components removed.  Final components were   opened and cement was mixed.  The knee was irrigated with normal saline   solution and pulse lavage.  The synovial lining was   then injected with 30 cc of 0.25% Marcaine with epinephrine and 1 cc of Toradol plus 30 cc of NS for a   total of 61 cc.      The knee was irrigated.  Final implants were then cemented onto clean and   dried cut surfaces of bone with the knee brought to extension with a size 7   mm trial insert.      Once the cement had fully cured, the excess cement was removed   throughout the knee.  I confirmed I was satisfied with the range of   motion and stability, and the final size 7 mm PS AOX insert was chosen.  It was   placed into the knee.      The tourniquet had been let down at 29  minutes.  No significant   hemostasis required.  The   extensor mechanism was then reapproximated using #1 Vicryl and #0 V-lock sutures with the knee   in flexion.  The   remaining wound was closed with 2-0 Vicryl and running 4-0  Monocryl.   The knee was cleaned, dried, dressed sterilely using Dermabond and   Aquacel dressing.  The patient was then   brought to recovery room in stable condition, tolerating the procedure   well.   Please note that Physician Assistant, Danae Orleans. PA-C, was present for the entirety of the case, and was utilized for pre-operative positioning, peri-operative retractor management, general facilitation of the procedure.  He was also utilized for primary wound closure at the end of the case.              Pietro Cassis Alvan Dame, M.D.    12/01/2015 2:47 PM

## 2015-12-01 NOTE — Anesthesia Preprocedure Evaluation (Signed)
Anesthesia Evaluation  Patient identified by MRN, date of birth, ID band Patient awake    Reviewed: Allergy & Precautions, NPO status , Patient's Chart, lab work & pertinent test results, reviewed documented beta blocker date and time   History of Anesthesia Complications (+) PONVNegative for: history of anesthetic complications  Airway Mallampati: II  TM Distance: <3 FB Neck ROM: Full    Dental  (+) Teeth Intact, Dental Advisory Given   Pulmonary neg pulmonary ROS,    breath sounds clear to auscultation       Cardiovascular hypertension, Pt. on medications (-) angina(-) Past MI and (-) CHF  Rhythm:Regular     Neuro/Psych PSYCHIATRIC DISORDERS Anxiety Depression TIA   GI/Hepatic negative GI ROS,   Endo/Other  Morbid obesity  Renal/GU negative Renal ROS     Musculoskeletal  (+) Arthritis ,   Abdominal   Peds  Hematology negative hematology ROS (+)   Anesthesia Other Findings   Reproductive/Obstetrics                             Lab Results  Component Value Date   WBC 5.2 11/25/2015   HGB 13.2 11/25/2015   HCT 39.8 11/25/2015   MCV 88.8 11/25/2015   PLT 281 11/25/2015   Lab Results  Component Value Date   CREATININE 0.61 11/25/2015   BUN 17 11/25/2015   NA 137 11/25/2015   K 4.0 11/25/2015   CL 104 11/25/2015   CO2 25 11/25/2015   Lab Results  Component Value Date   INR 1.00 07/17/2015    Anesthesia Physical  Anesthesia Plan  ASA: III  Anesthesia Plan: MAC and Spinal   Post-op Pain Management:    Induction: Intravenous  Airway Management Planned: Simple Face Mask  Additional Equipment:   Intra-op Plan:   Post-operative Plan:   Informed Consent: I have reviewed the patients History and Physical, chart, labs and discussed the procedure including the risks, benefits and alternatives for the proposed anesthesia with the patient or authorized representative who has  indicated his/her understanding and acceptance.     Plan Discussed with: CRNA and Surgeon  Anesthesia Plan Comments:         Anesthesia Quick Evaluation

## 2015-12-01 NOTE — Interval H&P Note (Signed)
History and Physical Interval Note:  12/01/2015 11:50 AM  Tammy Cooper  has presented today for surgery, with the diagnosis of LEFT KNEE OA  The various methods of treatment have been discussed with the patient and family. After consideration of risks, benefits and other options for treatment, the patient has consented to  Procedure(s): LEFT TOTAL KNEE ARTHROPLASTY (Left) as a surgical intervention .  The patient's history has been reviewed, patient examined, no change in status, stable for surgery.  I have reviewed the patient's chart and labs.  Questions were answered to the patient's satisfaction.     Mauri Pole

## 2015-12-02 LAB — BASIC METABOLIC PANEL
Anion gap: 5 (ref 5–15)
BUN: 12 mg/dL (ref 6–20)
CHLORIDE: 104 mmol/L (ref 101–111)
CO2: 28 mmol/L (ref 22–32)
Calcium: 9 mg/dL (ref 8.9–10.3)
Creatinine, Ser: 0.71 mg/dL (ref 0.44–1.00)
GFR calc Af Amer: >60 mL/min (ref >60)
GLUCOSE: 180 mg/dL — AB (ref 65–99)
POTASSIUM: 4.6 mmol/L (ref 3.5–5.1)
Sodium: 137 mmol/L (ref 135–145)

## 2015-12-02 LAB — CBC
HCT: 34.3 % — ABNORMAL LOW (ref 36.0–46.0)
Hemoglobin: 11.3 g/dL — ABNORMAL LOW (ref 12.0–15.0)
MCH: 29.6 pg (ref 26.0–34.0)
MCHC: 32.9 g/dL (ref 30.0–36.0)
MCV: 89.8 fL (ref 78.0–100.0)
PLATELETS: 255 10*3/uL (ref 150–400)
RBC: 3.82 MIL/uL — ABNORMAL LOW (ref 3.87–5.11)
RDW: 13.5 % (ref 11.5–15.5)
WBC: 11.9 10*3/uL — ABNORMAL HIGH (ref 4.0–10.5)

## 2015-12-02 MED ORDER — FERROUS SULFATE 325 (65 FE) MG PO TABS: 325.0000 mg | ORAL_TABLET | Freq: Three times a day (TID) | ORAL | 3 refills | Status: DC

## 2015-12-02 MED ORDER — HYDROCODONE-ACETAMINOPHEN 7.5-325 MG PO TABS: 1.0000 | ORAL_TABLET | ORAL | 0 refills | Status: DC | PRN

## 2015-12-02 MED ORDER — POLYETHYLENE GLYCOL 3350 17 G PO PACK: 17.0000 g | PACK | Freq: Two times a day (BID) | ORAL | 0 refills | Status: DC

## 2015-12-02 MED ORDER — DOCUSATE SODIUM 100 MG PO CAPS: 100.0000 mg | ORAL_CAPSULE | Freq: Two times a day (BID) | ORAL | 0 refills | Status: DC

## 2015-12-02 MED ORDER — TIZANIDINE HCL 4 MG PO TABS: 4.0000 mg | ORAL_TABLET | Freq: Four times a day (QID) | ORAL | 0 refills | Status: DC | PRN

## 2015-12-02 MED ORDER — ASPIRIN 81 MG PO CHEW: 81.0000 mg | CHEWABLE_TABLET | Freq: Two times a day (BID) | ORAL | 0 refills | Status: DC

## 2015-12-02 NOTE — Evaluation (Signed)
Physical Therapy Evaluation Patient Details Name: Tammy Cooper MRN: [id number] DOB: 31-Aug-1950 Today's Date: 12/02/2015   History of Present Illness  s/p L TKA  Clinical Impression  Pt s/p L TKR presents with decreased L LE strength/ROM and post op pain limiting functional mobility.  Pt should progress well to dc home with family assist and HHPT follow up.    Follow Up Recommendations Home health PT    Equipment Recommendations  None recommended by PT    Recommendations for Other Services OT consult     Precautions / Restrictions Precautions Precautions: Fall;Knee Restrictions Weight Bearing Restrictions: No Other Position/Activity Restrictions: WBAT      Mobility  Bed Mobility Overal bed mobility: Needs Assistance Bed Mobility: Supine to Sit     Supine to sit: Min guard Sit to supine: Supervision   General bed mobility comments: cues for sequence and min guard for L LE  Transfers Overall transfer level: Needs assistance Equipment used: Rolling walker (2 wheeled) Transfers: Sit to/from Stand Sit to Stand: Min guard         General transfer comment: cues for UE placement and LE management  Ambulation/Gait Ambulation/Gait assistance: Min assist;Min guard Ambulation Distance (Feet): 90 Feet Assistive device: Rolling walker (2 wheeled) Gait Pattern/deviations: Decreased step length - right;Decreased step length - left;Step-to pattern;Step-through pattern;Shuffle;Trunk flexed Gait velocity: decr Gait velocity interpretation: Below normal speed for age/gender General Gait Details: cues for sequence, posture and position from [employer]            Wheelchair Mobility    Modified Rankin (Stroke Patients Only)       Balance                                             Pertinent Vitals/Pain Pain Assessment: 0-10 Pain Score: 2  Pain Location: L knee Pain Descriptors / Indicators: Aching;Sore Pain Intervention(s):  Limited activity within patient's tolerance;Monitored during session;Premedicated before session;Ice applied    Home Living Family/patient expects to be discharged to:: Private residence Living Arrangements: Spouse/significant other Available Help at Discharge: Family Type of Home: House Home Access: Stairs to enter Entrance Stairs-Rails: None Entrance Stairs-Number of Steps: 3 Home Layout: Two level Home Equipment: Environmental consultant - 2 wheels;Cane - single point;Crutches      Prior Function Level of Independence: Independent               Hand Dominance        Extremity/Trunk Assessment   Upper Extremity Assessment: Overall WFL for tasks assessed           Lower Extremity Assessment: LLE deficits/detail   LLE Deficits / Details: 3/5 quads with IND SLR and AAROM at knee -10 - 75  Cervical / Trunk Assessment: Normal  Communication   Communication: No difficulties  Cognition Arousal/Alertness: Awake/alert Behavior During Therapy: WFL for tasks assessed/performed Overall Cognitive Status: Within Functional Limits for tasks assessed                      General Comments      Exercises Total Joint Exercises Ankle Circles/Pumps: AROM;Both;15 reps;Supine Quad Sets: AROM;Both;10 reps;Supine Heel Slides: AAROM;15 reps;Supine;Left Straight Leg Raises: AAROM;AROM;Left;10 reps;Supine      Assessment/Plan    PT Assessment Patient needs continued PT services  PT Diagnosis Difficulty walking   PT Problem List Decreased strength;Decreased range of motion;Decreased activity  tolerance;Decreased mobility;Decreased knowledge of use of DME;Pain  PT Treatment Interventions DME instruction;Gait training;Stair training;Functional mobility training;Therapeutic exercise;Therapeutic activities;Patient/family education   PT Goals (Current goals can be found in the Care Plan section) Acute Rehab PT Goals Patient Stated Goal: This knee turns out as good as the previous knee PT  Goal Formulation: With patient Time For Goal Achievement: 12/04/15 Potential to Achieve Goals: Good    Frequency 7X/week   Barriers to discharge        Co-evaluation               End of Session Equipment Utilized During Treatment: Gait belt Activity Tolerance: Patient tolerated treatment well Patient left: in chair;with call bell/phone within reach;with chair alarm set Nurse Communication: Mobility status         Time: [id number] PT Time Calculation (min) (ACUTE ONLY): 29 min   Charges:   PT Evaluation $PT Eval Low Complexity: 1 Procedure PT Treatments $Therapeutic Exercise: 8-22 mins   PT G Codes:        Tyger Wichman 2015/12/24, 12:35 PM

## 2015-12-02 NOTE — Progress Notes (Signed)
Physical Therapy Treatment Patient Details Name: Tammy Cooper MRN: [id number] DOB: 23-Dec-1950 Today's Date: 12/02/2015    History of Present Illness s/p L TKA    PT Comments    Pt progressing well with mobility and eager for dc home.  Reviewed stairs with written instructions provided.  Follow Up Recommendations  Home health PT     Equipment Recommendations  None recommended by PT    Recommendations for Other Services OT consult     Precautions / Restrictions Precautions Precautions: Fall;Knee Restrictions Weight Bearing Restrictions: No Other Position/Activity Restrictions: WBAT    Mobility  Bed Mobility Overal bed mobility: Needs Assistance Bed Mobility: Supine to Sit;Sit to Supine     Supine to sit: Supervision Sit to supine: Supervision   General bed mobility comments: cues for sequence and min guard for L LE  Transfers Overall transfer level: Needs assistance Equipment used: Rolling walker (2 wheeled) Transfers: Sit to/from Stand Sit to Stand: Supervision         General transfer comment: cues for UE placement and LE management  Ambulation/Gait Ambulation/Gait assistance: Min guard;Supervision Ambulation Distance (Feet): 100 Feet Assistive device: Rolling walker (2 wheeled) Gait Pattern/deviations: Step-to pattern;Step-through pattern;Decreased step length - right;Decreased step length - left;Shuffle;Trunk flexed Gait velocity: decr Gait velocity interpretation: Below normal speed for age/gender General Gait Details: cues for sequence, posture and position from RW   Stairs Stairs: Yes Stairs assistance: Min assist Stair Management: No rails;One rail Right;Step to pattern;Forwards;Backwards;With walker;With crutches Number of Stairs: 8 General stair comments: 4 stairs bkwd with RW, 4 stairs fwd with crutch and rail  Wheelchair Mobility    Modified Rankin (Stroke Patients Only)       Balance                                     Cognition Arousal/Alertness: Awake/alert Behavior During Therapy: WFL for tasks assessed/performed Overall Cognitive Status: Within Functional Limits for tasks assessed                      Exercises Total Joint Exercises Ankle Circles/Pumps: AROM;Both;15 reps;Supine Quad Sets: AROM;Both;10 reps;Supine Heel Slides: AAROM;15 reps;Supine;Left Straight Leg Raises: AAROM;AROM;Left;10 reps;Supine    General Comments        Pertinent Vitals/Pain Pain Assessment: 0-10 Pain Score: 3  Pain Location: L knee Pain Descriptors / Indicators: Aching;Sore Pain Intervention(s): Limited activity within patient's tolerance;Monitored during session;Premedicated before session;Ice applied    Home Living Family/patient expects to be discharged to:: Private residence Living Arrangements: Spouse/significant other Available Help at Discharge: Family Type of Home: House Home Access: Stairs to enter Entrance Stairs-Rails: None Home Layout: Two level Home Equipment: Environmental consultant - 2 wheels;Cane - single point;Crutches      Prior Function Level of Independence: Independent          PT Goals (current goals can now be found in the care plan section) Acute Rehab PT Goals Patient Stated Goal: This knee turns out as good as the previous knee PT Goal Formulation: With patient Time For Goal Achievement: 12/04/15 Potential to Achieve Goals: Good Progress towards PT goals: Progressing toward goals    Frequency  7X/week    PT Plan Current plan remains appropriate    Co-evaluation             End of Session Equipment Utilized During Treatment: Gait belt Activity Tolerance: Patient tolerated treatment well Patient left: with call  bell/phone within reach;with chair alarm set;in bed     Time: 1118-1150 PT Time Calculation (min) (ACUTE ONLY): 32 min  Charges:  $Gait Training: 8-22 mins $Therapeutic Exercise: 8-22 mins                    G Codes:       Early Ord 2015-12-18, 2:17 PM

## 2015-12-02 NOTE — Discharge Instructions (Signed)

## 2015-12-02 NOTE — Progress Notes (Signed)
     Subjective: 1 Day Post-Op Procedure(s) (LRB): LEFT TOTAL KNEE ARTHROPLASTY (Left)   Patient reports pain as mild, pain controlled. Feels that the pain is less after this surgery as compared to the right TKA.   No events throughout the night.  Ready to be discharged home.  Objective:   VITALS:   Vitals:   12/02/15 0530 12/02/15 0903  BP: (!) 133/53 (!) 116/40  Pulse: 65 63  Resp: 16 16  Temp: 97.8 F (36.6 C) 97.5 F (36.4 C)    Dorsiflexion/Plantar flexion intact Incision: dressing C/D/I No cellulitis present Compartment soft  LABS  Recent Labs  12/02/15 0427  HGB 11.3*  HCT 34.3*  WBC 11.9*  PLT 255     Recent Labs  12/02/15 0427  NA 137  K 4.6  BUN 12  CREATININE 0.71  GLUCOSE 180*     Assessment/Plan: 1 Day Post-Op Procedure(s) (LRB): LEFT TOTAL KNEE ARTHROPLASTY (Left) Foley cath d/c'ed Advance diet Up with therapy D/C IV fluids Discharge home with home health  Follow up in 2 weeks at [facility] Follow up with OLIN,Seraya Jobst D in 2 weeks.  Contact information:  [facility] [address] [place] [id number]    Obese (BMI 30-39.9) Estimated body mass index is 35.92 kg/m as calculated from the following:   Height as of this encounter: 5\' 3"  (1.6 m).   Weight as of this encounter: 92 kg (202 lb 12.8 oz). Patient also counseled that weight may inhibit the healing process Patient counseled that losing weight will help with future health issues       West Pugh. Nyliah Nierenberg   PAC  12/02/2015, 9:28 AM

## 2015-12-02 NOTE — Evaluation (Signed)
Occupational Therapy Evaluation Patient Details Name: Tammy Cooper MRN: [id number] DOB: May 25, 1950 Today's Date: 12/02/2015    History of Present Illness s/p L TKA   Clinical Impression   This 65 year old female was admitted for the above sx. All education was completed.  No further OT needs at this time    Follow Up Recommendations  No OT follow up    Equipment Recommendations  None recommended by OT    Recommendations for Other Services       Precautions / Restrictions Precautions Precautions: Fall;Knee Restrictions Weight Bearing Restrictions: No      Mobility Bed Mobility Overal bed mobility: Needs Assistance Bed Mobility: Supine to Sit;Sit to Supine     Supine to sit: Supervision Sit to supine: Supervision   General bed mobility comments: pt able to manage LLE herself:  HOB raised  Transfers Overall transfer level: Needs assistance Equipment used: Rolling walker (2 wheeled) Transfers: Sit to/from Stand Sit to Stand: Supervision         General transfer comment: cues for UE placement    Balance                                            ADL Overall ADL's : Needs assistance/impaired     Grooming: Supervision/safety;Wash/dry hands;Standing                   Armed forces technical officer: Supervision/safety;Ambulation;Comfort height toilet;RW;Grab bars   Toileting- Clothing Manipulation and Hygiene: Supervision/safety;Sit to/from stand   Tub/ Shower Transfer: Walk-in shower;Supervision/safety;Ambulation     General ADL Comments: pt has assistance for adls as needed. Cued for hand placement for sit to stand and for moving walker further to step between hands. Reviewed precautions.       Vision     Perception     Praxis      Pertinent Vitals/Pain Pain Assessment: No/denies pain     Hand Dominance     Extremity/Trunk Assessment Upper Extremity Assessment Upper Extremity Assessment: Overall WFL for tasks  assessed           Communication Communication Communication: No difficulties   Cognition Arousal/Alertness: Awake/alert Behavior During Therapy: WFL for tasks assessed/performed Overall Cognitive Status: Within Functional Limits for tasks assessed                     General Comments       Exercises       Shoulder Instructions      Home Living Family/patient expects to be discharged to:: Private residence Living Arrangements: Spouse/significant other                 Bathroom Shower/Tub: Occupational psychologist: Handicapped height     Home Equipment: Bedside commode;Shower seat          Prior Functioning/Environment Level of Independence: Independent             OT Diagnosis: Generalized weakness   OT Problem List:     OT Treatment/Interventions:      OT Goals(Current goals can be found in the care plan section) Acute Rehab OT Goals Patient Stated Goal: none stated OT Goal Formulation: All assessment and education complete, DC therapy  OT Frequency:     Barriers to D/C:            Co-evaluation  End of Session    Activity Tolerance: Patient tolerated treatment well Patient left: in bed;with call bell/phone within reach   Time: [id number] OT Time Calculation (min): 15 min Charges:  OT General Charges $OT Visit: 1 Procedure OT Evaluation $OT Eval Low Complexity: 1 Procedure G-Codes:    Rishi Vicario 12-28-15, 12:20 PM Lesle Chris, OTR/L [phone] 12-28-15

## 2015-12-02 NOTE — Care Management Note (Signed)
Case Management Note  Patient Details  Name: FREDIA CHITTENDEN MRN: [id number] Date of Birth: 1950-12-23  Subjective/Objective:                  LEFT TOTAL KNEE ARTHROPLASTY (Left)  Action/Plan: Discharge planning Expected Discharge Date: 12/01/08                 Expected Discharge Plan:  [place]  In-House Referral:     Discharge planning Services  CM Consult  Post Acute Care Choice:  Home Health Choice offered to:  Patient  DME Arranged:  N/A DME Agency:  NA  HH Arranged:  PT [place] Agency:  [facility]  Status of Service:  Completed, signed off  If discussed at [employer], dates discussed:    Additional Comments: CM met with pt in room to offer choice of home health agency. [facility] to render HHPT.  Referral given to [facility] rep, Tim with request for [facility] [place].  Pt has both rolling walker and 3n1 at home and denies need for additional DMe.  No other CM needs were communicated. Dellie Catholic, RN 12/02/2015, 10:09 AM

## 2015-12-08 NOTE — Discharge Summary (Signed)
Physician Discharge Summary  Patient ID: Tammy Cooper MRN: [id number] DOB/AGE: [birth year] 65 y.o.  Admit date: 12/01/2015 Discharge date: 12/02/2015   Procedures:  Procedure(s) (LRB): LEFT TOTAL KNEE ARTHROPLASTY (Left)  Attending Physician:  Dr. Paralee Cancel   Admission Diagnoses:   Left knee primary OA / pain  Discharge Diagnoses:  Principal Problem:   S/P left TKA Active Problems:   S/P knee replacement   Obese  Past Medical History:  Diagnosis Date  . Anxiety   . Arthritis   . Breast cancer ([place]) 05/28/14   Left breast in situ   . Complication of anesthesia   . Constipation    occasiopnal  . History of breast cancer 2016  . History of recurrent cystitis   . Hypercholesteremia   . Hypertension   . PONV (postoperative nausea and vomiting)    "a little nausea"  . Spinal stenosis   . Stroke [facility])    H/O TIA in 2006, MRA clear,arteriogram was done which was normal    HPI:    Tammy Cooper, 65 y.o. female, has a history of pain and functional disability in the left knee due to arthritis and has failed non-surgical conservative treatments for greater than 12 weeks to include NSAID's and/or analgesics, corticosteriod injections and activity modification.  Onset of symptoms was gradual, starting 7+ years ago with gradually worsening course since that time. The patient noted prior procedures on the knee to include  arthroplasty on the right knee in April 2017.  Patient currently rates pain in the left knee(s) at 9 out of 10 with activity. Patient has night pain, worsening of pain with activity and weight bearing, pain that interferes with activities of daily living and pain with passive range of motion.  Patient has evidence of periarticular osteophytes and joint space narrowing by imaging studies. There is no active infection.   Risks, benefits and expectations were discussed with the patient.  Risks including but not limited to the risk of anesthesia,  blood clots, nerve damage, blood vessel damage, failure of the prosthesis, infection and up to and including death.  Patient understand the risks, benefits and expectations and wishes to proceed with surgery.   PCP: Lynne Logan, MD   Discharged Condition: good  Hospital Course:  Patient underwent the above stated procedure on 12/01/2015. Patient tolerated the procedure well and brought to the recovery room in good condition and subsequently to the floor.  POD #1 BP: 116/40 ; Pulse: 63 ; Temp: 97.5 F (36.4 C) ; Resp: 16 Patient reports pain as mild, pain controlled. Feels that the pain is less after this surgery as compared to the right TKA.   No events throughout the night.  Ready to be discharged home. Dorsiflexion/plantar flexion intact, incision: dressing C/D/I, no cellulitis present and compartment soft.   LABS  Basename    HGB     11.3  HCT     34.3    Discharge Exam: General appearance: alert, cooperative and no distress Extremities: Homans sign is negative, no sign of DVT, no edema, redness or tenderness in the calves or thighs and no ulcers, gangrene or trophic changes  Disposition: Home with follow up in 2 weeks   Follow-up Information    Mauri Pole, MD. Schedule an appointment as soon as possible for a visit in 2 week(s).   Specialty:  Orthopedic Surgery Contact information: [address] [place] [address] [phone]        [facility] .  Why:  now known as [facility].  Mechele Claude has been requested for your physical therapist. Contact information: [place] [address] [phone]           Discharge Instructions    Call MD / Call 911    Complete by:  As directed   If you experience chest pain or shortness of breath, CALL 911 and be transported to the hospital emergency room.  If you develope a fever above 101 F, pus (white drainage) or increased drainage or redness at the wound, or calf  pain, call your surgeon's office.   Change dressing    Complete by:  As directed   Maintain surgical dressing until follow up in the clinic. If the edges start to pull up, may reinforce with tape. If the dressing is no longer working, may remove and cover with gauze and tape, but must keep the area dry and clean.  Call with any questions or concerns.   Constipation Prevention    Complete by:  As directed   Drink plenty of fluids.  Prune juice may be helpful.  You may use a stool softener, such as Colace (over the counter) 100 mg twice a day.  Use MiraLax (over the counter) for constipation as needed.   Diet - low sodium heart healthy    Complete by:  As directed   Discharge instructions    Complete by:  As directed   Maintain surgical dressing until follow up in the clinic. If the edges start to pull up, may reinforce with tape. If the dressing is no longer working, may remove and cover with gauze and tape, but must keep the area dry and clean.  Follow up in 2 weeks at [facility]. Call with any questions or concerns.   Increase activity slowly as tolerated    Complete by:  As directed   Weight bearing as tolerated with assist device (walker, cane, etc) as directed, use it as long as suggested by your surgeon or therapist, typically at least 4-6 weeks.   TED hose    Complete by:  As directed   Use stockings (TED hose) for 2 weeks on both leg(s).  You may remove them at night for sleeping.        Medication List    STOP taking these medications   aspirin 81 MG tablet Replaced by:  aspirin 81 MG chewable tablet     TAKE these medications   anastrozole 1 MG tablet Commonly known as:  ARIMIDEX TAKE 1 TABLET EACH DAY.   aspirin 81 MG chewable tablet Chew 1 tablet (81 mg total) by mouth 2 (two) times daily. Take for 4 weeks, then resume regular dose. Replaces:  aspirin 81 MG tablet   bisacodyl 5 MG EC tablet Commonly known as:  DULCOLAX Take 5 mg by mouth daily as needed for  mild constipation or moderate constipation.   Co-Enzyme Q10 200 MG Caps Take 300 mg by mouth daily.   docusate sodium 100 MG capsule Commonly known as:  COLACE Take 1 capsule (100 mg total) by mouth 2 (two) times daily. What changed:  when to take this   ferrous sulfate 325 (65 FE) MG tablet Take 1 tablet (325 mg total) by mouth 3 (three) times daily after meals.   HYDROcodone-acetaminophen 7.5-325 MG tablet Commonly known as:  NORCO Take 1-2 tablets by mouth every 4 (four) hours as needed for moderate pain.   multivitamin with minerals Tabs tablet  Take 1 tablet by mouth daily.   polyethylene glycol packet Commonly known as:  MIRALAX / GLYCOLAX Take 17 g by mouth 2 (two) times daily.   rosuvastatin 10 MG tablet Commonly known as:  CRESTOR Take 1 tablet (10 mg total) by mouth daily.   telmisartan-hydrochlorothiazide 40-12.5 MG tablet Commonly known as:  MICARDIS HCT Take 1 tablet by mouth daily.   tiZANidine 4 MG tablet Commonly known as:  ZANAFLEX Take 1 tablet (4 mg total) by mouth every 6 (six) hours as needed for muscle spasms.   venlafaxine XR 37.5 MG 24 hr capsule Commonly known as:  EFFEXOR-XR TAKE 1 CAPSULE IN THE MORNING WITH BREAKFAST.   ZETIA 10 MG tablet Generic drug:  ezetimibe TAKE 1 TABLET ONCE DAILY.        Signed: West Pugh. Taz Vanness   PA-C  12/08/2015, 11:29 AM

## 2015-12-19 ENCOUNTER — Other Ambulatory Visit: Payer: Self-pay | Admitting: Cardiology

## 2016-02-16 ENCOUNTER — Other Ambulatory Visit: Payer: Self-pay | Admitting: Cardiology

## 2016-02-23 NOTE — Assessment & Plan Note (Signed)
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 Prognosis: Based on [facility] prognostic nomogram, with antiestrogen therapy and radiation therapy her 5 year risk of recurrence of 1% in 10 year risk is a 2%. Adjuvant radiation completed 10/31/2014 Current treatment: Adjuvant anastrozole 1 mg daily ( chosen because of history of TIA) since 11/18/2014  Major depression: Much improved with Effexor XR 37.5 mg once daily. This has been helping  both depression and hot flashes.  Anastrozole toxicities: 1. Hot flashes: Much improved after Effexor was started 2. Mood swings are also improved  Breast Cancer Surveillance: 1. Breast exam 02/23/2016 did not reveal any suspicious findings 2. Mammogram 05/11/2015: No evidence of malignancy stable bilateral benign breast calcifications    She plans to do a left knee replacement surgery in 6 months.  Return to clinic in 1 yr for follow-up

## 2016-02-24 ENCOUNTER — Encounter: Payer: Self-pay | Admitting: Hematology and Oncology

## 2016-02-24 ENCOUNTER — Ambulatory Visit (HOSPITAL_BASED_OUTPATIENT_CLINIC_OR_DEPARTMENT_OTHER): Payer: Medicare Other | Admitting: Hematology and Oncology

## 2016-02-24 DIAGNOSIS — C50412 Malignant neoplasm of upper-outer quadrant of left female breast: Secondary | ICD-10-CM

## 2016-02-24 DIAGNOSIS — Z170 Estrogen receptor positive status [ER+]: Principal | ICD-10-CM

## 2016-02-24 DIAGNOSIS — N951 Menopausal and female climacteric states: Secondary | ICD-10-CM

## 2016-02-24 DIAGNOSIS — Z853 Personal history of malignant neoplasm of breast: Secondary | ICD-10-CM | POA: Diagnosis not present

## 2016-02-24 NOTE — Progress Notes (Signed)
Patient Care Team: Donald Prose, MD as PCP - General (Family Medicine) Sueanne Margarita, MD as Consulting Physician (Cardiology)  DIAGNOSIS:  Encounter Diagnosis  Name Primary?  . Malignant neoplasm of upper-outer quadrant of left breast in female, estrogen receptor positive ([place])     SUMMARY OF ONCOLOGIC HISTORY:   Breast cancer of upper-outer quadrant of left female breast ([place])   05/22/2014 Breast MRI    Left breast 12:00 position irregular nodular enhancement on 0.6 x 1.2 x 0.8 cm, no abnormal lymph nodes      07/31/2014 Surgery    Left breast lumpectomy: DCIS with calcifications 1 cm size ER 99%, PR 99%      09/29/2014 - 10/31/2014 Radiation Therapy    Adjuvant radiation      11/18/2014 -  Anti-estrogen oral therapy    anastrozole 1 mg daily 5 years       CHIEF COMPLIANT: Follow-up on anastrozole therapy  INTERVAL HISTORY: Tammy Cooper who goes by the name Tammy Cooper , who is here today for follow-up on anastrozole therapy. She reports that the hot flashes have improved with Effexor as well as cooling beads. She no longer has problems with depression or emotional problems because she is on Effexor. She denies any lumps or nodules in the breasts.  REVIEW OF SYSTEMS:   Constitutional: Denies fevers, chills or abnormal weight loss Eyes: Denies blurriness of vision Ears, nose, mouth, throat, and face: Denies mucositis or sore throat Respiratory: Denies cough, dyspnea or wheezes Cardiovascular: Denies palpitation, chest discomfort Gastrointestinal:  Denies nausea, heartburn or change in bowel habits Skin: Denies abnormal skin rashes Lymphatics: Denies new lymphadenopathy or easy bruising Neurological:Denies numbness, tingling or new weaknesses Behavioral/Psych: Mood is stable, no new changes  Extremities: No lower extremity edema Breast:  denies any pain or lumps or nodules in either breasts All other systems were reviewed with the patient and are negative.  I have  reviewed the past medical history, past surgical history, social history and family history with the patient and they are unchanged from previous note.  ALLERGIES:  is allergic to lipitor [atorvastatin].  MEDICATIONS:  Current Outpatient Prescriptions  Medication Sig Dispense Refill  . anastrozole (ARIMIDEX) 1 MG tablet TAKE 1 TABLET EACH DAY. 90 tablet 3  . aspirin 81 MG chewable tablet Chew 1 tablet (81 mg total) by mouth 2 (two) times daily. Take for 4 weeks, then resume regular dose. 60 tablet 0  . bisacodyl (DULCOLAX) 5 MG EC tablet Take 5 mg by mouth daily as needed for mild constipation or moderate constipation.    [name] Co-Enzyme Q10 200 MG CAPS Take 300 mg by mouth daily.     [name] docusate sodium (COLACE) 100 MG capsule Take 1 capsule (100 mg total) by mouth 2 (two) times daily. 10 capsule 0  . ezetimibe (ZETIA) 10 MG tablet Take 1 tablet (10 mg total) by mouth daily. 90 tablet 3  . ferrous sulfate 325 (65 FE) MG tablet Take 1 tablet (325 mg total) by mouth 3 (three) times daily after meals.  3  . HYDROcodone-acetaminophen (NORCO) 7.5-325 MG tablet Take 1-2 tablets by mouth every 4 (four) hours as needed for moderate pain. 100 tablet 0  . Multiple Vitamin (MULTIVITAMIN WITH MINERALS) TABS tablet Take 1 tablet by mouth daily.    . polyethylene glycol (MIRALAX / GLYCOLAX) packet Take 17 g by mouth 2 (two) times daily. 14 each 0  . rosuvastatin (CRESTOR) 10 MG tablet Take 1 tablet (10 mg total) by  mouth daily. 90 tablet 3  . telmisartan-hydrochlorothiazide (MICARDIS HCT) 40-12.5 MG tablet Take 1 tablet by mouth daily. 90 tablet 2  . tiZANidine (ZANAFLEX) 4 MG tablet Take 1 tablet (4 mg total) by mouth every 6 (six) hours as needed for muscle spasms. 40 tablet 0  . venlafaxine XR (EFFEXOR-XR) 37.5 MG 24 hr capsule TAKE 1 CAPSULE IN THE MORNING WITH BREAKFAST. 90 capsule 3   No current facility-administered medications for this visit.     PHYSICAL EXAMINATION: ECOG PERFORMANCE STATUS: 1 -  Symptomatic but completely ambulatory  Vitals:   02/24/16 0953  BP: (!) 136/53  Pulse: 71  Resp: 17  Temp: 98 F (36.7 C)   Filed Weights   02/24/16 0953  Weight: 204 lb 12.8 oz (92.9 kg)    GENERAL:alert, no distress and comfortable SKIN: skin color, texture, turgor are normal, no rashes or significant lesions EYES: normal, Conjunctiva are pink and non-injected, sclera clear OROPHARYNX:no exudate, no erythema and lips, buccal mucosa, and tongue normal  NECK: supple, thyroid normal size, non-tender, without nodularity LYMPH:  no palpable lymphadenopathy in the cervical, axillary or inguinal LUNGS: clear to auscultation and percussion with normal breathing effort HEART: regular rate & rhythm and no murmurs and no lower extremity edema ABDOMEN:abdomen soft, non-tender and normal bowel sounds MUSCULOSKELETAL:no cyanosis of digits and no clubbing  NEURO: alert & oriented x 3 with fluent speech, no focal motor/sensory deficits EXTREMITIES: No lower extremity edema BREAST: No palpable masses or nodules in either right or left breasts. No palpable axillary supraclavicular or infraclavicular adenopathy no breast tenderness or nipple discharge. (exam performed in the presence of a chaperone)  LABORATORY DATA:  I have reviewed the data as listed   Chemistry      Component Value Date/Time   NA 137 12/02/2015 0427   K 4.6 12/02/2015 0427   CL 104 12/02/2015 0427   CO2 28 12/02/2015 0427   BUN 12 12/02/2015 0427   CREATININE 0.71 12/02/2015 0427      Component Value Date/Time   CALCIUM 9.0 12/02/2015 0427   ALKPHOS 87 09/07/2015 1109   AST 22 09/07/2015 1109   ALT 21 09/07/2015 1109   BILITOT 0.5 09/07/2015 1109       Lab Results  Component Value Date   WBC 11.9 (H) 12/02/2015   HGB 11.3 (L) 12/02/2015   HCT 34.3 (L) 12/02/2015   MCV 89.8 12/02/2015   PLT 255 12/02/2015   NEUTROABS 3.8 12/07/2006     ASSESSMENT & PLAN:  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 Prognosis: Based on [facility] prognostic nomogram, with antiestrogen therapy and radiation therapy her 5 year risk of recurrence of 1% in 10 year risk is a 2%. Adjuvant radiation completed 10/31/2014 Current treatment: Adjuvant anastrozole 1 mg daily ( chosen because of history of TIA) since 11/18/2014  Major depression: Much improved with Effexor XR 37.5 mg once daily. This has been helping  both depression and hot flashes.  Anastrozole toxicities: 1. Hot flashes: Much improved after Effexor was started 2. Mood swings are also improved  Breast Cancer Surveillance: 1. Breast exam 02/23/2016 did not reveal any suspicious findings 2. Mammogram 05/11/2015: No evidence of malignancy stable bilateral benign breast calcifications    She completed bilateral knee replacement surgeries and is doing very well. She is planning on going to take her granddaughter age 68 to [place] for 10th  birthday party  Return to clinic in 1 yr for follow-up   No orders of the defined types were placed in this encounter.  The patient has a good understanding of the overall plan. she agrees with it. she will call with any problems that may develop before the next visit here.   Rulon Eisenmenger, MD 02/24/16

## 2016-05-02 ENCOUNTER — Other Ambulatory Visit: Payer: Self-pay | Admitting: Hematology and Oncology

## 2016-05-02 DIAGNOSIS — Z853 Personal history of malignant neoplasm of breast: Secondary | ICD-10-CM

## 2016-05-17 ENCOUNTER — Telehealth: Payer: Self-pay | Admitting: Cardiology

## 2016-05-17 NOTE — Telephone Encounter (Signed)
Patient reports that at about 0330 this AM, she was awakened with what felt like a "muscle spasm" in her heart. It felt like her heart was contracting forcefully and then would relax. The episode only lasted a few seconds and resolved. She reports this exact thing has happened about 5-6 times over the last year and she has never before reported it.  She states she has no other symptoms when the "spasm" occurs. She reports the episodes seem to happen while sleeping only.  She is taking her medications as instructed. She has no vital signs to report.  She is currently asymptomatic.  To Dr. Radford Pax for recommendations.

## 2016-05-17 NOTE — Telephone Encounter (Signed)
Follow Up:   Pt calling back again,says she is still waiting to hear something. She had wanted to be seen today or tomorrow.

## 2016-05-17 NOTE — Telephone Encounter (Signed)
New message  Pt call requesting to speak with RN. Pt states over the night she felt she was experiencing a heart cramp. Pt states it started around 3am 1/23 and it was a coming and going cramp. Pt states no other symptoms. please call back to discuss

## 2016-05-18 NOTE — Telephone Encounter (Signed)
Follow Up:   Pt said she was waiting to hear from you,please call today if possible. I told her you were in clinic

## 2016-05-18 NOTE — Telephone Encounter (Signed)
Called patient to follow-up. She reports she feels fine today and the "cramping" did not occur last night. Per Dr. Radford Pax, scheduled patient for evaluation tomorrow at 1500. Patient was grateful for call.

## 2016-05-18 NOTE — Telephone Encounter (Signed)
Please get in with extender 

## 2016-05-19 ENCOUNTER — Ambulatory Visit (INDEPENDENT_AMBULATORY_CARE_PROVIDER_SITE_OTHER): Payer: Medicare Other | Admitting: Physician Assistant

## 2016-05-19 ENCOUNTER — Encounter: Payer: Self-pay | Admitting: Physician Assistant

## 2016-05-19 VITALS — BP 140/78 | HR 68 | Ht 63.0 in | Wt 207.1 lb

## 2016-05-19 DIAGNOSIS — E785 Hyperlipidemia, unspecified: Secondary | ICD-10-CM

## 2016-05-19 DIAGNOSIS — I10 Essential (primary) hypertension: Secondary | ICD-10-CM

## 2016-05-19 DIAGNOSIS — R079 Chest pain, unspecified: Secondary | ICD-10-CM | POA: Diagnosis not present

## 2016-05-19 NOTE — Patient Instructions (Addendum)
Medication Instructions:  Your physician recommends that you continue on your current medications as directed. Please refer to the Current Medication list given to you today.   Labwork: None ordered  Testing/Procedures: Your physician has requested that you have en exercise stress myoview. For further information please visit [email or web address]. Please follow instruction sheet, as given.   Follow-Up: Your physician recommends that you schedule a follow-up appointment in: WILL BE BASED UPON THE TEST RESULTS   Any Other Special Instructions Will Be Listed Below (If Applicable).   Exercise Stress Electrocardiogram An exercise stress electrocardiogram is a test that is done to evaluate the blood supply to your heart. This test may also be called exercise stress electrocardiography. The test is done while you are walking on a treadmill. The goal of this test is to raise your heart rate. This test is done to find areas of poor blood flow to the heart by determining the extent of coronary artery disease (CAD). CAD is defined as narrowing in one or more heart (coronary) arteries of more than 70%. If you have an abnormal test result, this may mean that you are not getting adequate blood flow to your heart during exercise. Additional testing may be needed to understand why your test was abnormal. Tell a health care provider about:  Any allergies you have.  All medicines you are taking, including vitamins, herbs, eye drops, creams, and over-the-counter medicines.  Any problems you or family members have had with anesthetic medicines.  Any blood disorders you have.  Any surgeries you have had.  Any medical conditions you have.  Possibility of pregnancy, if this applies. What are the risks? Generally, this is a safe procedure. However, as with any procedure, complications can occur. Possible complications can include:  Pain or pressure in the following areas:  Chest.  Jaw or  neck.  Between your shoulder blades.  Radiating down your left arm.  Dizziness or light-headedness.  Shortness of breath.  Increased or irregular heartbeats.  Nausea or vomiting.  Heart attack (rare). What happens before the procedure?  Avoid all forms of caffeine 24 hours before your test or as directed by your health care provider. This includes coffee, tea (even decaffeinated tea), caffeinated sodas, chocolate, cocoa, and certain pain medicines.  Follow your health care provider's instructions regarding eating and drinking before the test.  Take your medicines as directed at regular times with water unless instructed otherwise. Exceptions may include:  If you have diabetes, ask how you are to take your insulin or pills. It is common to adjust insulin dosing the morning of the test.  If you are taking beta-blocker medicines, it is important to talk to your health care provider about these medicines well before the date of your test. Taking beta-blocker medicines may interfere with the test. In some cases, these medicines need to be changed or stopped 24 hours or more before the test.  If you wear a nitroglycerin patch, it may need to be removed prior to the test. Ask your health care provider if the patch should be removed before the test.  If you use an inhaler for any breathing condition, bring it with you to the test.  If you are an outpatient, bring a snack so you can eat right after the stress phase of the test.  Do not smoke for 4 hours prior to the test or as directed by your health care provider.  Do not apply lotions, powders, creams, or oils on your chest prior to  the test.  Wear loose-fitting clothes and comfortable shoes for the test. This test involves walking on a treadmill. What happens during the procedure?  Multiple patches (electrodes) will be put on your chest. If needed, small areas of your chest may have to be shaved to get better contact with the  electrodes. Once the electrodes are attached to your body, multiple wires will be attached to the electrodes and your heart rate will be monitored.  Your heart will be monitored both at rest and while exercising.  You will walk on a treadmill. The treadmill will be started at a slow pace. The treadmill speed and incline will gradually be increased to raise your heart rate. What happens after the procedure?  Your heart rate and blood pressure will be monitored after the test.  You may return to your normal schedule including diet, activities, and medicines, unless your health care provider tells you otherwise. This information is not intended to replace advice given to you by your health care provider. Make sure you discuss any questions you have with your health care provider. Document Released: 04/08/2000 Document Revised: 09/17/2015 Document Reviewed: 12/17/2012 Elsevier Interactive Patient Education  2017 [employer].   If you need a refill on your cardiac medications before your next appointment, please call your pharmacy.

## 2016-05-19 NOTE — Progress Notes (Signed)
Cardiology Office Note    Date:  05/19/2016   ID:  Tammy Cooper, DOB Jun 17, 1950, MRN [id number]  PCP:  Lynne Logan, MD  Cardiologist:  Dr. Radford Pax  CC: chest pain   History of Present Illness:  Tammy Cooper is a 66 y.o. female with a history of HTN, HLD, breast cancer s/p lumpectomy/chemoRT, possible TIA and obesity who presents to clinic for evaluation of chest pain.   She has followed with Dr. Radford Pax for HTN and HLD. No past history of CAD. No myoview or echo that I can find in records.  Today she presents to clinic for evaluation of chest pain. Last Monday she was awoken from sleep around 3 am with a "cramp" in her central/left chest. Felt like she couldn't take a deep breath. It tightened and let go and tightened again. It lasts seconds. Rates it 6/10 pain. No diaphoresis or nauseated. No radiation. This has happened to her once or twice before. No LE edema, orthopnea or PND. No dizziness or syncope. No blood in stool or urine. No palpitations.  She is working on starting on exercising. She just had her knees replaced so working with a Physiological scientist. Does walk up stairs and stays active around the house with no chest pain or pressure or cramping. No DOE.   Her mother passed of a MI at the age of 40.     Past Medical History:  Diagnosis Date  . Anxiety   . Arthritis   . Breast cancer ([place]) 05/28/14   Left breast in situ   . Complication of anesthesia   . Constipation    occasiopnal  . History of breast cancer 2016  . History of recurrent cystitis   . Hypercholesteremia   . Hypertension   . PONV (postoperative nausea and vomiting)    "a little nausea"  . Spinal stenosis   . Stroke [facility])    H/O TIA in 2006, MRA clear,arteriogram was done which was normal    Past Surgical History:  Procedure Laterality Date  . anal skin tag removal  10/21/10  . BACK SURGERY  07/12/2010   stenosis L4 + L5  . BLADDER TACK     WITH THE HYSTERECTOMY  . BREAST  LUMPECTOMY WITH RADIOACTIVE SEED LOCALIZATION Left 07/31/2014   Procedure: LEFT BREAST LUMPECTOMY WITH RADIOACTIVE SEED LOCALIZATION;  Surgeon: Coralie Keens, MD;  Location: [facility];  Service: General;  Laterality: Left;  . COLONOSCOPY    . HEMORROIDECTOMY    . KNEE ARTHROSCOPY  2010   left  . ROTATOR CUFF REPAIR  2005   rt shoulder  . TONSILLECTOMY    . TOTAL KNEE ARTHROPLASTY Right 08/04/2015   Procedure: RIGHT TOTAL KNEE ARTHROPLASTY;  Surgeon: Paralee Cancel, MD;  Location: [facility];  Service: Orthopedics;  Laterality: Right;  . TOTAL KNEE ARTHROPLASTY Left 12/01/2015   Procedure: LEFT TOTAL KNEE ARTHROPLASTY;  Surgeon: Paralee Cancel, MD;  Location: [facility];  Service: Orthopedics;  Laterality: Left;  [name] VAGINAL HYSTERECTOMY  2003   total hyst-BSO-Ant/post repair    Current Medications: Outpatient Medications Prior to Visit  Medication Sig Dispense Refill  . aspirin 81 MG chewable tablet Chew 1 tablet (81 mg total) by mouth 2 (two) times daily. Take for 4 weeks, then resume regular dose. 60 tablet 0  . Co-Enzyme Q10 200 MG CAPS Take 300 mg by mouth daily.     [name] ezetimibe (ZETIA) 10 MG tablet Take 1 tablet (10 mg total) by mouth daily.  90 tablet 3  . Multiple Vitamin (MULTIVITAMIN WITH MINERALS) TABS tablet Take 1 tablet by mouth daily.    . rosuvastatin (CRESTOR) 10 MG tablet Take 1 tablet (10 mg total) by mouth daily. 90 tablet 3  . telmisartan-hydrochlorothiazide (MICARDIS HCT) 40-12.5 MG tablet Take 1 tablet by mouth daily. 90 tablet 2  . tiZANidine (ZANAFLEX) 4 MG tablet Take 1 tablet (4 mg total) by mouth every 6 (six) hours as needed for muscle spasms. 40 tablet 0  . venlafaxine XR (EFFEXOR-XR) 37.5 MG 24 hr capsule TAKE 1 CAPSULE IN THE MORNING WITH BREAKFAST. 90 capsule 3  . anastrozole (ARIMIDEX) 1 MG tablet TAKE 1 TABLET EACH DAY. 90 tablet 3   No facility-administered medications prior to visit.      Allergies:   Lipitor [atorvastatin] and Adhesive [tape]    Social History   Social History  . Marital status: Married    Spouse name: N/A  . Number of children: N/A  . Years of education: N/A   Social History Main Topics  . Smoking status: Never Smoker  . Smokeless tobacco: Never Used  . Alcohol use Yes     Comment: ocassionally  . Drug use: No  . Sexual activity: Yes   Other Topics Concern  . None   Social History Narrative  . None     Family History:  The patient's family history includes Cancer in her father; Hypertension in her mother and sister.      ROS:   Please see the history of present illness.    ROS All other systems reviewed and are negative.   PHYSICAL EXAM:   VS:  BP 140/78   Pulse 68   Ht 5\' 3"  (1.6 m)   Wt 207 lb 1.9 oz (93.9 kg)   BMI 36.69 kg/m    GEN: Well nourished, well developed, in no acute distress, obese HEENT: normal  Neck: no JVD, carotid bruits, or masses Cardiac: RRR; no murmurs, rubs, or gallops,no edema  Respiratory:  clear to auscultation bilaterally, normal work of breathing GI: soft, nontender, nondistended, + BS MS: no deformity or atrophy  Skin: warm and dry, no rash Neuro:  Alert and Oriented x 3, Strength and sensation are intact Psych: euthymic mood, full affect    Wt Readings from Last 3 Encounters:  05/19/16 207 lb 1.9 oz (93.9 kg)  02/24/16 204 lb 12.8 oz (92.9 kg)  12/01/15 202 lb 12.8 oz (92 kg)      Studies/Labs Reviewed:   EKG:  EKG is ordered today.  The ekg ordered today demonstrates NSR HR 68  Recent Labs: 09/07/2015: ALT 21 12/02/2015: BUN 12; Creatinine, Ser 0.71; Hemoglobin 11.3; Platelets 255; Potassium 4.6; Sodium 137   Lipid Panel    Component Value Date/Time   CHOL 140 10/23/2015 0734   CHOL 160 09/02/2014 0730   TRIG 139 10/23/2015 0734   TRIG 206 (H) 09/02/2014 0730   HDL 53 10/23/2015 0734   HDL 55 09/02/2014 0730   CHOLHDL 2.6 10/23/2015 0734   CHOLHDL 3.1 CALC 05/29/2006 1013   VLDL 24 05/29/2006 1013   LDLCALC 59 10/23/2015 0734    LDLCALC 64 09/02/2014 0730    Additional studies/ records that were reviewed today include:  none   ASSESSMENT & PLAN:   Chest pain: very atypical for cardiac angina but will get ETT myoview given RFs for CAD (obesity, HTN, HLD, TIA) and for reassurance. ECG today NSR with no acute ST or TW changes. If myoview normal and  occurs more frequently we could try a CCB for possible vasospasm   HTN: BP rechecked at 120/70. Continue Mycardis  HLD: continue Crestor and Zetia. Followed by our lipid clinic  Medication Adjustments/Labs and Tests Ordered: Current medicines are reviewed at length with the patient today.  Concerns regarding medicines are outlined above.  Medication changes, Labs and Tests ordered today are listed in the Patient Instructions below. Patient Instructions  Medication Instructions:  Your physician recommends that you continue on your current medications as directed. Please refer to the Current Medication list given to you today.   Labwork: None ordered  Testing/Procedures: Your physician has requested that you have en exercise stress myoview. For further information please visit [email or web address]. Please follow instruction sheet, as given.   Follow-Up: Your physician recommends that you schedule a follow-up appointment in: WILL BE BASED UPON THE TEST RESULTS   Any Other Special Instructions Will Be Listed Below (If Applicable).   Exercise Stress Electrocardiogram An exercise stress electrocardiogram is a test that is done to evaluate the blood supply to your heart. This test may also be called exercise stress electrocardiography. The test is done while you are walking on a treadmill. The goal of this test is to raise your heart rate. This test is done to find areas of poor blood flow to the heart by determining the extent of coronary artery disease (CAD). CAD is defined as narrowing in one or more heart (coronary) arteries of more than 70%. If you have an abnormal  test result, this may mean that you are not getting adequate blood flow to your heart during exercise. Additional testing may be needed to understand why your test was abnormal. Tell a health care provider about:  Any allergies you have.  All medicines you are taking, including vitamins, herbs, eye drops, creams, and over-the-counter medicines.  Any problems you or family members have had with anesthetic medicines.  Any blood disorders you have.  Any surgeries you have had.  Any medical conditions you have.  Possibility of pregnancy, if this applies. What are the risks? Generally, this is a safe procedure. However, as with any procedure, complications can occur. Possible complications can include:  Pain or pressure in the following areas:  Chest.  Jaw or neck.  Between your shoulder blades.  Radiating down your left arm.  Dizziness or light-headedness.  Shortness of breath.  Increased or irregular heartbeats.  Nausea or vomiting.  Heart attack (rare). What happens before the procedure?  Avoid all forms of caffeine 24 hours before your test or as directed by your health care provider. This includes coffee, tea (even decaffeinated tea), caffeinated sodas, chocolate, cocoa, and certain pain medicines.  Follow your health care provider's instructions regarding eating and drinking before the test.  Take your medicines as directed at regular times with water unless instructed otherwise. Exceptions may include:  If you have diabetes, ask how you are to take your insulin or pills. It is common to adjust insulin dosing the morning of the test.  If you are taking beta-blocker medicines, it is important to talk to your health care provider about these medicines well before the date of your test. Taking beta-blocker medicines may interfere with the test. In some cases, these medicines need to be changed or stopped 24 hours or more before the test.  If you wear a nitroglycerin  patch, it may need to be removed prior to the test. Ask your health care provider if the patch should be removed before  the test.  If you use an inhaler for any breathing condition, bring it with you to the test.  If you are an outpatient, bring a snack so you can eat right after the stress phase of the test.  Do not smoke for 4 hours prior to the test or as directed by your health care provider.  Do not apply lotions, powders, creams, or oils on your chest prior to the test.  Wear loose-fitting clothes and comfortable shoes for the test. This test involves walking on a treadmill. What happens during the procedure?  Multiple patches (electrodes) will be put on your chest. If needed, small areas of your chest may have to be shaved to get better contact with the electrodes. Once the electrodes are attached to your body, multiple wires will be attached to the electrodes and your heart rate will be monitored.  Your heart will be monitored both at rest and while exercising.  You will walk on a treadmill. The treadmill will be started at a slow pace. The treadmill speed and incline will gradually be increased to raise your heart rate. What happens after the procedure?  Your heart rate and blood pressure will be monitored after the test.  You may return to your normal schedule including diet, activities, and medicines, unless your health care provider tells you otherwise. This information is not intended to replace advice given to you by your health care provider. Make sure you discuss any questions you have with your health care provider. Document Released: 04/08/2000 Document Revised: 09/17/2015 Document Reviewed: 12/17/2012 Elsevier Interactive Patient Education  2017 [employer].   If you need a refill on your cardiac medications before your next appointment, please call your pharmacy.      Signed, Angelena Form, PA-C  05/19/2016 3:31 PM    [facility]  [facility] [address], [place], [place]  [address] Phone: [phone]; Fax: [phone]

## 2016-05-20 ENCOUNTER — Telehealth (HOSPITAL_COMMUNITY): Payer: Self-pay | Admitting: *Deleted

## 2016-05-20 NOTE — Telephone Encounter (Signed)
Patient given detailed instructions per Myocardial Perfusion Study Information Sheet for the test on 05/23/16 at 10:00. Patient notified to arrive 15 minutes early and that it is imperative to arrive on time for appointment to keep from having the test rescheduled.  If you need to cancel or reschedule your appointment, please call the office within 24 hours of your appointment. Failure to do so may result in a cancellation of your appointment, and a $50 no show fee. Patient verbalized understanding.Tammy Cooper

## 2016-05-23 ENCOUNTER — Encounter (HOSPITAL_COMMUNITY): Payer: Medicare Other

## 2016-05-23 ENCOUNTER — Ambulatory Visit (HOSPITAL_COMMUNITY): Payer: Medicare Other | Attending: Cardiology

## 2016-05-23 DIAGNOSIS — R079 Chest pain, unspecified: Secondary | ICD-10-CM | POA: Diagnosis not present

## 2016-05-23 LAB — MYOCARDIAL PERFUSION IMAGING
CHL CUP MPHR: 152 {beats}/min
CHL CUP NUCLEAR SDS: 3
CHL CUP NUCLEAR SRS: 3
CHL CUP NUCLEAR SSS: 6
CHL CUP RESTING HR STRESS: 68 {beats}/min
CSEPED: 7 min
CSEPEW: 8.5 METS
CSEPPHR: 136 {beats}/min
Exercise duration (sec): 0 s
LHR: 0.36
LV dias vol: 84 mL (ref 46–106)
LV sys vol: 20 mL
NUC STRESS TID: 0.91
Percent HR: 89 %

## 2016-05-23 MED ORDER — TECHNETIUM TC 99M TETROFOSMIN IV KIT
31.6000 | PACK | Freq: Once | INTRAVENOUS | Status: AC | PRN
  Administered 2016-05-23: 31.6 via INTRAVENOUS
  Filled 2016-05-23: qty 32

## 2016-05-23 MED ORDER — TECHNETIUM TC 99M TETROFOSMIN IV KIT
10.6000 | PACK | Freq: Once | INTRAVENOUS | Status: AC | PRN
  Administered 2016-05-23: 10.6 via INTRAVENOUS
  Filled 2016-05-23: qty 11

## 2016-07-11 ENCOUNTER — Other Ambulatory Visit: Payer: Self-pay | Admitting: Hematology and Oncology

## 2016-07-11 DIAGNOSIS — C50412 Malignant neoplasm of upper-outer quadrant of left female breast: Secondary | ICD-10-CM

## 2016-07-12 ENCOUNTER — Ambulatory Visit
Admission: RE | Admit: 2016-07-12 | Discharge: 2016-07-12 | Disposition: A | Discharge status: Home / Self Care | Payer: Medicare Other | Source: Ambulatory Visit | Attending: Hematology and Oncology | Admitting: Hematology and Oncology

## 2016-07-12 DIAGNOSIS — Z853 Personal history of malignant neoplasm of breast: Secondary | ICD-10-CM

## 2016-08-23 ENCOUNTER — Encounter: Payer: Self-pay | Admitting: Hematology and Oncology

## 2016-08-23 ENCOUNTER — Ambulatory Visit (HOSPITAL_BASED_OUTPATIENT_CLINIC_OR_DEPARTMENT_OTHER): Payer: Medicare Other | Admitting: Hematology and Oncology

## 2016-08-23 DIAGNOSIS — N951 Menopausal and female climacteric states: Secondary | ICD-10-CM

## 2016-08-23 DIAGNOSIS — Z170 Estrogen receptor positive status [ER+]: Secondary | ICD-10-CM | POA: Diagnosis not present

## 2016-08-23 DIAGNOSIS — F329 Major depressive disorder, single episode, unspecified: Secondary | ICD-10-CM

## 2016-08-23 DIAGNOSIS — Z79811 Long term (current) use of aromatase inhibitors: Secondary | ICD-10-CM

## 2016-08-23 DIAGNOSIS — C50412 Malignant neoplasm of upper-outer quadrant of left female breast: Secondary | ICD-10-CM

## 2016-08-23 NOTE — Progress Notes (Signed)
Patient Care Team: Donald Prose, MD as PCP - General (Family Medicine) Sueanne Margarita, MD as Consulting Physician (Cardiology)  DIAGNOSIS:  Encounter Diagnosis  Name Primary?  . Malignant neoplasm of upper-outer quadrant of left breast in female, estrogen receptor positive ([place])     SUMMARY OF ONCOLOGIC HISTORY:   Breast cancer of upper-outer quadrant of left female breast ([place])   05/22/2014 Breast MRI    Left breast 12:00 position irregular nodular enhancement on 0.6 x 1.2 x 0.8 cm, no abnormal lymph nodes      07/31/2014 Surgery    Left breast lumpectomy: DCIS with calcifications 1 cm size ER 99%, PR 99%      09/29/2014 - 10/31/2014 Radiation Therapy    Adjuvant radiation      11/18/2014 -  Anti-estrogen oral therapy    anastrozole 1 mg daily 5 years       CHIEF COMPLIANT: Follow-up on anastrozole therapy  INTERVAL HISTORY: Tammy Cooper is a 66 year-old with above-mentioned history of breast cancer treated with lumpectomy and radiation is currently on anastrozole. She does complain of profound sweats and hot flashes but she is able to manage them fairly well. She does not want to stop the treatment or change any treatment plan. She had been to [place] and enjoyed the sites with her 17 year old granddaughter. She denies any lumps or nodules in breast. She has intermittent muscle spasms in the left breast. This led to a stress test which was normal.  REVIEW OF SYSTEMS:   Constitutional: Denies fevers, chills or abnormal weight loss Eyes: Denies blurriness of vision Ears, nose, mouth, throat, and face: Denies mucositis or sore throat Respiratory: Denies cough, dyspnea or wheezes Cardiovascular: Denies palpitation, chest discomfort Gastrointestinal:  Denies nausea, heartburn or change in bowel habits Skin: Denies abnormal skin rashes Lymphatics: Denies new lymphadenopathy or easy bruising Neurological:Denies numbness, tingling or new  weaknesses Behavioral/Psych: Mood is stable, no new changes  Extremities: No lower extremity edema Breast: Intermittent left breast pain All other systems were reviewed with the patient and are negative.  I have reviewed the past medical history, past surgical history, social history and family history with the patient and they are unchanged from previous note.  ALLERGIES:  is allergic to lipitor [atorvastatin] and adhesive [tape].  MEDICATIONS:  Current Outpatient Prescriptions  Medication Sig Dispense Refill  . anastrozole (ARIMIDEX) 1 MG tablet Take 1 mg by mouth daily.    [name] aspirin 81 MG chewable tablet Chew 1 tablet (81 mg total) by mouth 2 (two) times daily. Take for 4 weeks, then resume regular dose. 60 tablet 0  . Co-Enzyme Q10 200 MG CAPS Take 300 mg by mouth daily.     [name] ezetimibe (ZETIA) 10 MG tablet Take 1 tablet (10 mg total) by mouth daily. 90 tablet 3  . Multiple Vitamin (MULTIVITAMIN WITH MINERALS) TABS tablet Take 1 tablet by mouth daily.    . rosuvastatin (CRESTOR) 10 MG tablet Take 1 tablet (10 mg total) by mouth daily. 90 tablet 3  . telmisartan-hydrochlorothiazide (MICARDIS HCT) 40-12.5 MG tablet Take 1 tablet by mouth daily. 90 tablet 2  . tiZANidine (ZANAFLEX) 4 MG tablet Take 1 tablet (4 mg total) by mouth every 6 (six) hours as needed for muscle spasms. 40 tablet 0  . venlafaxine XR (EFFEXOR-XR) 37.5 MG 24 hr capsule TAKE 1 CAPSULE IN THE MORNING WITH BREAKFAST. 90 capsule 0   No current facility-administered medications for this visit.     PHYSICAL  EXAMINATION: ECOG PERFORMANCE STATUS: 1 - Symptomatic but completely ambulatory  There were no vitals filed for this visit. There were no vitals filed for this visit.  GENERAL:alert, no distress and comfortable SKIN: skin color, texture, turgor are normal, no rashes or significant lesions EYES: normal, Conjunctiva are pink and non-injected, sclera clear OROPHARYNX:no exudate, no erythema and lips, buccal mucosa,  and tongue normal  NECK: supple, thyroid normal size, non-tender, without nodularity LYMPH:  no palpable lymphadenopathy in the cervical, axillary or inguinal LUNGS: clear to auscultation and percussion with normal breathing effort HEART: regular rate & rhythm and no murmurs and no lower extremity edema ABDOMEN:abdomen soft, non-tender and normal bowel sounds MUSCULOSKELETAL:no cyanosis of digits and no clubbing  NEURO: alert & oriented x 3 with fluent speech, no focal motor/sensory deficits EXTREMITIES: No lower extremity edema  LABORATORY DATA:  I have reviewed the data as listed   Chemistry      Component Value Date/Time   NA 137 12/02/2015 0427   K 4.6 12/02/2015 0427   CL 104 12/02/2015 0427   CO2 28 12/02/2015 0427   BUN 12 12/02/2015 0427   CREATININE 0.71 12/02/2015 0427      Component Value Date/Time   CALCIUM 9.0 12/02/2015 0427   ALKPHOS 87 09/07/2015 1109   AST 22 09/07/2015 1109   ALT 21 09/07/2015 1109   BILITOT 0.5 09/07/2015 1109       Lab Results  Component Value Date   WBC 11.9 (H) 12/02/2015   HGB 11.3 (L) 12/02/2015   HCT 34.3 (L) 12/02/2015   MCV 89.8 12/02/2015   PLT 255 12/02/2015   NEUTROABS 3.8 12/07/2006    ASSESSMENT & PLAN:  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 Prognosis: Based on [facility] prognostic nomogram, with antiestrogen therapy and radiation therapy her 5 year risk of recurrence of 1% in 10 year risk is a 2%. Adjuvant radiation completed 10/31/2014 Current treatment: Adjuvant anastrozole 1 mg daily ( chosen because of history of TIA) since 11/18/2014  Major depression: Much improved withEffexor XR 37.5 mg once daily. This has been helping both depression and hot flashes.  Anastrozole toxicities: 1. Hot flashes: Much improved after Effexor was started 2. Mood swings are also improved  Breast Cancer  Surveillance: 1. Breast exam 08/23/2016 did not reveal any suspicious findings 2. Mammogram 07/12/2016: No evidence of malignancy stable bilateral benign breast calcifications most likely related to fat necrosis but a six-month follow-up mammogram is recommended breast density category C  Left breast pain: I discussed with her that it is not uncommon to have spasms of the pectoral muscle after radiation therapy.    She completed bilateral knee replacement surgeries and is doing very well. She is planning on going to take the river cruise on [place]  Return to clinic in 1 yr for follow-up  I spent 25 minutes talking to the patient of which more than half was spent in counseling and coordination of care.  No orders of the defined types were placed in this encounter.  The patient has a good understanding of the overall plan. she agrees with it. she will call with any problems that may develop before the next visit here.   Rulon Eisenmenger, MD 08/23/16

## 2016-08-23 NOTE — Assessment & Plan Note (Signed)
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 Prognosis: Based on [facility] prognostic nomogram, with antiestrogen therapy and radiation therapy her 5 year risk of recurrence of 1% in 10 year risk is a 2%. Adjuvant radiation completed 10/31/2014 Current treatment: Adjuvant anastrozole 1 mg daily ( chosen because of history of TIA) since 11/18/2014  Major depression: Much improved withEffexor XR 37.5 mg once daily. This has been helping both depression and hot flashes.  Anastrozole toxicities: 1. Hot flashes: Much improved after Effexor was started 2. Mood swings are also improved  Breast Cancer Surveillance: 1. Breast exam 08/23/2016 did not reveal any suspicious findings 2. Mammogram 07/12/2016: No evidence of malignancy stable bilateral benign breast calcifications most likely related to fat necrosis but a six-month follow-up mammogram is recommended breast density category C    She completed bilateral knee replacement surgeries and is doing very well. She is planning on going to take her granddaughter age 33 to [place] for 10th birthday party  Return to clinic in 1 yr for follow-up

## 2016-08-24 ENCOUNTER — Other Ambulatory Visit: Payer: Self-pay | Admitting: Cardiology

## 2016-10-10 ENCOUNTER — Other Ambulatory Visit: Payer: Self-pay | Admitting: Hematology and Oncology

## 2016-10-10 DIAGNOSIS — C50412 Malignant neoplasm of upper-outer quadrant of left female breast: Secondary | ICD-10-CM

## 2016-10-11 ENCOUNTER — Other Ambulatory Visit: Payer: Self-pay | Admitting: Emergency Medicine

## 2016-10-11 DIAGNOSIS — C50412 Malignant neoplasm of upper-outer quadrant of left female breast: Secondary | ICD-10-CM

## 2016-10-11 MED ORDER — VENLAFAXINE HCL ER 37.5 MG PO CP24: ORAL_CAPSULE | ORAL | 3 refills | Status: DC

## 2016-11-16 ENCOUNTER — Other Ambulatory Visit: Payer: Self-pay | Admitting: Cardiology

## 2016-12-02 ENCOUNTER — Other Ambulatory Visit: Payer: Self-pay | Admitting: Hematology and Oncology

## 2016-12-02 DIAGNOSIS — Z9889 Other specified postprocedural states: Secondary | ICD-10-CM

## 2016-12-13 ENCOUNTER — Other Ambulatory Visit: Payer: Self-pay | Admitting: Hematology and Oncology

## 2017-01-13 ENCOUNTER — Ambulatory Visit
Admission: RE | Admit: 2017-01-13 | Discharge: 2017-01-13 | Disposition: A | Discharge status: Home / Self Care | Payer: Medicare Other | Source: Ambulatory Visit | Attending: Hematology and Oncology | Admitting: Hematology and Oncology

## 2017-01-13 DIAGNOSIS — Z9889 Other specified postprocedural states: Secondary | ICD-10-CM

## 2017-01-16 ENCOUNTER — Other Ambulatory Visit: Payer: Self-pay | Admitting: Cardiology

## 2017-02-25 ENCOUNTER — Other Ambulatory Visit: Payer: Self-pay | Admitting: Cardiology

## 2017-03-19 ENCOUNTER — Other Ambulatory Visit: Payer: Self-pay | Admitting: Hematology and Oncology

## 2017-05-17 ENCOUNTER — Other Ambulatory Visit: Payer: Self-pay | Admitting: Cardiology

## 2017-06-09 ENCOUNTER — Other Ambulatory Visit: Payer: Self-pay | Admitting: Hematology and Oncology

## 2017-06-09 ENCOUNTER — Telehealth: Payer: Self-pay | Admitting: Hematology and Oncology

## 2017-06-09 DIAGNOSIS — R921 Mammographic calcification found on diagnostic imaging of breast: Secondary | ICD-10-CM

## 2017-06-09 NOTE — Telephone Encounter (Signed)
Gave avs and calendar for may  °

## 2017-06-24 ENCOUNTER — Other Ambulatory Visit: Payer: Self-pay | Admitting: Cardiology

## 2017-07-14 ENCOUNTER — Ambulatory Visit
Admission: RE | Admit: 2017-07-14 | Discharge: 2017-07-14 | Disposition: A | Discharge status: Home / Self Care | Payer: Medicare Other | Source: Ambulatory Visit | Attending: Hematology and Oncology | Admitting: Hematology and Oncology

## 2017-07-14 DIAGNOSIS — R921 Mammographic calcification found on diagnostic imaging of breast: Secondary | ICD-10-CM

## 2017-07-30 NOTE — Progress Notes (Addendum)
Cardiology Office Note:    Date:  07/31/2017   ID:  Jazzie Trampe Hamilton-Smith, DOB 1950-06-14, MRN [id number]  PCP:  Donald Prose, MD  Cardiologist:  No primary care provider on file.    Referring MD: Donald Prose, MD   Chief Complaint  Patient presents with  . Hypertension  . Hyperlipidemia    History of Present Illness:    Tammy Cooper is a 67 y.o. female with a hx of HTN and dyslipidemia.  She is here today for followup and is doing well.  She denies any  SOB, DOE, PND, orthopnea, LE edema, dizziness, palpitations or syncope. She occasionally has been having a sudden onset of cramping discomfort on the left side of her chest that is nonexertional and can wake her up at night.  She denies any associated N/diaphoresis or SOB.  Her PCP told her it was related to scar tissue from her prior radiation.  She is compliant with her meds and is tolerating meds with no SE.  She walks a mile daily in 20 minutes and is trying to increase her endurance daily.    Past Medical History:  Diagnosis Date  . Anxiety   . Arthritis   . Breast cancer ([place]) 05/28/14   Left breast in situ   . Complication of anesthesia   . Constipation    occasiopnal  . History of breast cancer 2016  . History of recurrent cystitis   . Hypercholesteremia   . Hypertension   . PONV (postoperative nausea and vomiting)    "a little nausea"  . Spinal stenosis   . Stroke [facility])    H/O TIA in 2006, MRA clear,arteriogram was done which was normal    Past Surgical History:  Procedure Laterality Date  . anal skin tag removal  10/21/10  . BACK SURGERY  07/12/2010   stenosis L4 + L5  . BLADDER TACK     WITH THE HYSTERECTOMY  . BREAST BIOPSY Left 06/13/2014   Benign  . BREAST BIOPSY Left 05/28/2014   High Risk  . BREAST LUMPECTOMY Left 07/31/2014  . BREAST LUMPECTOMY WITH RADIOACTIVE SEED LOCALIZATION Left 07/31/2014   Procedure: LEFT BREAST LUMPECTOMY WITH RADIOACTIVE SEED LOCALIZATION;  Surgeon: Coralie Keens, MD;  Location: [place];  Service: General;  Laterality: Left;  . COLONOSCOPY    . HEMORROIDECTOMY    . KNEE ARTHROSCOPY  2010   left  . ROTATOR CUFF REPAIR  2005   rt shoulder  . TONSILLECTOMY    . TOTAL KNEE ARTHROPLASTY Right 08/04/2015   Procedure: RIGHT TOTAL KNEE ARTHROPLASTY;  Surgeon: Paralee Cancel, MD;  Location: [facility];  Service: Orthopedics;  Laterality: Right;  . TOTAL KNEE ARTHROPLASTY Left 12/01/2015   Procedure: LEFT TOTAL KNEE ARTHROPLASTY;  Surgeon: Paralee Cancel, MD;  Location: [facility];  Service: Orthopedics;  Laterality: Left;  [name] VAGINAL HYSTERECTOMY  2003   total hyst-BSO-Ant/post repair    Current Medications: Current Meds  Medication Sig  . anastrozole (ARIMIDEX) 1 MG tablet Take 1 mg by mouth daily.  [name] aspirin EC 81 MG tablet Take 81 mg by mouth daily.  [name] Co-Enzyme Q10 200 MG CAPS Take 300 mg by mouth daily.   [name] ezetimibe (ZETIA) 10 MG tablet Take 10 mg by mouth daily.  . Multiple Vitamin (MULTIVITAMIN WITH MINERALS) TABS tablet Take 1 tablet by mouth daily.  . rosuvastatin (CRESTOR) 10 MG tablet Take 10 mg by mouth daily.  [name] telmisartan-hydrochlorothiazide (MICARDIS HCT) 40-12.5 MG tablet Take 1  tablet by mouth daily. Please keep upcoming appt for future refills. Thank you  . [DISCONTINUED] anastrozole (ARIMIDEX) 1 MG tablet TAKE 1 TABLET EACH DAY.  . [DISCONTINUED] aspirin 81 MG chewable tablet Chew 1 tablet (81 mg total) by mouth 2 (two) times daily. Take for 4 weeks, then resume regular dose. (Patient taking differently: Chew 81 mg by mouth daily. )  . [DISCONTINUED] ezetimibe (ZETIA) 10 MG tablet TAKE 1 TABLET ONCE DAILY.  . [DISCONTINUED] rosuvastatin (CRESTOR) 10 MG tablet TAKE 1 TABLET EACH DAY.     Allergies:   Lipitor [atorvastatin] and Adhesive [tape]   Social History   Socioeconomic History  . Marital status: Married    Spouse name: Not on file  . Number of children: Not on file  . Years of education: Not on file  .  Highest education level: Not on file  Occupational History  . Not on file  Social Needs  . Financial resource strain: Not on file  . Food insecurity:    Worry: Not on file    Inability: Not on file  . Transportation needs:    Medical: Not on file    Non-medical: Not on file  Tobacco Use  . Smoking status: Never Smoker  . Smokeless tobacco: Never Used  Substance and Sexual Activity  . Alcohol use: Yes    Comment: ocassionally  . Drug use: No  . Sexual activity: Yes  Lifestyle  . Physical activity:    Days per week: Not on file    Minutes per session: Not on file  . Stress: Not on file  Relationships  . Social connections:    Talks on phone: Not on file    Gets together: Not on file    Attends religious service: Not on file    Active member of club or organization: Not on file    Attends meetings of clubs or organizations: Not on file    Relationship status: Not on file  Other Topics Concern  . Not on file  Social History Narrative  . Not on file     Family History: The patient's family history includes Cancer in her father; Hypertension in her mother and sister. There is no history of Heart attack.  ROS:   Please see the history of present illness.    Review of Systems  Psychiatric/Behavioral: The patient is nervous/anxious.     All other systems reviewed and negative.   EKGs/Labs/Other Studies Reviewed:    The following studies were reviewed today: none  EKG:  EKG is  ordered today.  The ekg ordered today demonstrates NSR with no ST changes  Recent Labs: No results found for requested labs within last 8760 hours.   Recent Lipid Panel    Component Value Date/Time   CHOL 140 10/23/2015 0734   CHOL 160 09/02/2014 0730   TRIG 139 10/23/2015 0734   TRIG 206 (H) 09/02/2014 0730   HDL 53 10/23/2015 0734   HDL 55 09/02/2014 0730   CHOLHDL 2.6 10/23/2015 0734   CHOLHDL 3.1 CALC 05/29/2006 1013   VLDL 24 05/29/2006 1013   LDLCALC 59 10/23/2015 0734    LDLCALC 64 09/02/2014 0730    Physical Exam:    VS:  BP 124/68   Pulse 67   Ht 5\' 3"  (1.6 m)   Wt 218 lb (98.9 kg)   BMI 38.62 kg/m     Wt Readings from Last 3 Encounters:  07/31/17 218 lb (98.9 kg)  08/23/16 220 lb 6.4 oz (  100 kg)  05/19/16 207 lb 1.9 oz (93.9 kg)     GEN:  Well nourished, well developed in no acute distress HEENT: Normal NECK: No JVD; No carotid bruits LYMPHATICS: No lymphadenopathy CARDIAC: RRR, no murmurs, rubs, gallops RESPIRATORY:  Clear to auscultation without rales, wheezing or rhonchi  ABDOMEN: Soft, non-tender, non-distended MUSCULOSKELETAL:  No edema; No deformity  SKIN: Warm and dry NEUROLOGIC:  Alert and oriented x 3 PSYCHIATRIC:  Normal affect   ASSESSMENT:    1. Essential hypertension   2. Pure hypercholesterolemia   3. Chest pain, unspecified type    PLAN:    In order of problems listed above:  1.  HTN - BP is well controlled on exam today.  She will continue on Micardis HCT 40-12.5mg  daily.    2.  Hyperlipidemia - She will continue on Crestor 10mg  daily and Zetia 10mg  daily.  I will get an FLP and ALT today.    Medication Adjustments/Labs and Tests Ordered: Current medicines are reviewed at length with the patient today.  Concerns regarding medicines are outlined above.  Orders Placed This Encounter  Procedures  . EKG 12-Lead   No orders of the defined types were placed in this encounter.   Signed, Fransico Him, MD  07/31/2017 9:12 AM    [place]

## 2017-07-31 ENCOUNTER — Ambulatory Visit: Payer: Medicare Other | Admitting: Cardiology

## 2017-07-31 ENCOUNTER — Encounter: Payer: Self-pay | Admitting: Cardiology

## 2017-07-31 VITALS — BP 124/68 | HR 67 | Ht 63.0 in | Wt 218.0 lb

## 2017-07-31 DIAGNOSIS — R079 Chest pain, unspecified: Secondary | ICD-10-CM | POA: Diagnosis not present

## 2017-07-31 DIAGNOSIS — E7800 Pure hypercholesterolemia, unspecified: Secondary | ICD-10-CM

## 2017-07-31 DIAGNOSIS — I10 Essential (primary) hypertension: Secondary | ICD-10-CM | POA: Diagnosis not present

## 2017-07-31 LAB — BASIC METABOLIC PANEL
BUN/Creatinine Ratio: 23 (ref 12–28)
BUN: 20 mg/dL (ref 8–27)
CALCIUM: 9.6 mg/dL (ref 8.7–10.3)
CHLORIDE: 99 mmol/L (ref 96–106)
CO2: 23 mmol/L (ref 20–29)
Creatinine, Ser: 0.87 mg/dL (ref 0.57–1.00)
GFR calc non Af Amer: 68 mL/min/{1.73_m2} (ref >59)
GFR, EST AFRICAN AMERICAN: 79 mL/min/{1.73_m2} (ref >59)
Glucose: 113 mg/dL — ABNORMAL HIGH (ref 65–99)
POTASSIUM: 4.1 mmol/L (ref 3.5–5.2)
SODIUM: 139 mmol/L (ref 134–144)

## 2017-07-31 LAB — LIPID PANEL
Chol/HDL Ratio: 3.1 ratio (ref 0.0–4.4)
Cholesterol, Total: 149 mg/dL (ref 100–199)
HDL: 48 mg/dL (ref >39)
LDL Calculated: 69 mg/dL (ref 0–99)
TRIGLYCERIDES: 158 mg/dL — AB (ref 0–149)
VLDL CHOLESTEROL CAL: 32 mg/dL (ref 5–40)

## 2017-07-31 LAB — HEPATIC FUNCTION PANEL
ALT: 28 IU/L (ref 0–32)
AST: 28 IU/L (ref 0–40)
Albumin: 4.8 g/dL (ref 3.6–4.8)
Alkaline Phosphatase: 123 IU/L — ABNORMAL HIGH (ref 39–117)
BILIRUBIN TOTAL: 0.4 mg/dL (ref 0.0–1.2)
Bilirubin, Direct: 0.12 mg/dL (ref 0.00–0.40)
Total Protein: 7 g/dL (ref 6.0–8.5)

## 2017-07-31 MED ORDER — METOPROLOL TARTRATE 50 MG PO TABS: 50.0000 mg | ORAL_TABLET | Freq: Once | ORAL | 0 refills | Status: DC

## 2017-07-31 MED ORDER — TELMISARTAN-HCTZ 40-12.5 MG PO TABS: 1.0000 | ORAL_TABLET | Freq: Every day | ORAL | 3 refills | Status: DC

## 2017-07-31 MED ORDER — EZETIMIBE 10 MG PO TABS: 10.0000 mg | ORAL_TABLET | Freq: Every day | ORAL | 3 refills | Status: DC

## 2017-07-31 MED ORDER — ROSUVASTATIN CALCIUM 10 MG PO TABS: 10.0000 mg | ORAL_TABLET | Freq: Every day | ORAL | 3 refills | Status: DC

## 2017-07-31 NOTE — Patient Instructions (Signed)
Medication Instructions:  Your physician recommends that you continue on your current medications as directed. Please refer to the Current Medication list given to you today.  If you need a refill on your cardiac medications, please contact your pharmacy first.  Labwork: Today for basic metabolic panel, liver function test and cholesterol   Testing/Procedures: Your physician has requested that you have cardiac CT. Cardiac computed tomography (CT) is a painless test that uses an x-ray machine to take clear, detailed pictures of your heart. For further information please visit [email or web address]. Please follow instruction sheet as given.   Follow-Up: Your physician wants you to follow-up in: 1 year with Dr. Radford Pax. You will receive a reminder letter in the mail two months in advance. If you don't receive a letter, please call our office to schedule the follow-up appointment.  Any Other Special Instructions Will Be Listed Below (If Applicable).   Thank you for choosing [facility], RN  [phone]  If you need a refill on your cardiac medications before your next appointment, please call your pharmacy.

## 2017-08-23 ENCOUNTER — Ambulatory Visit: Payer: Medicare Other | Admitting: Hematology and Oncology

## 2017-08-28 ENCOUNTER — Telehealth: Payer: Self-pay | Admitting: Hematology and Oncology

## 2017-08-28 ENCOUNTER — Inpatient Hospital Stay: Payer: Medicare Other | Attending: Hematology and Oncology | Admitting: Hematology and Oncology

## 2017-08-28 DIAGNOSIS — N951 Menopausal and female climacteric states: Secondary | ICD-10-CM | POA: Insufficient documentation

## 2017-08-28 DIAGNOSIS — Z923 Personal history of irradiation: Secondary | ICD-10-CM | POA: Insufficient documentation

## 2017-08-28 DIAGNOSIS — Z170 Estrogen receptor positive status [ER+]: Secondary | ICD-10-CM | POA: Insufficient documentation

## 2017-08-28 DIAGNOSIS — D0512 Intraductal carcinoma in situ of left breast: Secondary | ICD-10-CM | POA: Insufficient documentation

## 2017-08-28 DIAGNOSIS — F329 Major depressive disorder, single episode, unspecified: Secondary | ICD-10-CM | POA: Diagnosis not present

## 2017-08-28 DIAGNOSIS — Z79811 Long term (current) use of aromatase inhibitors: Secondary | ICD-10-CM | POA: Diagnosis not present

## 2017-08-28 DIAGNOSIS — C50412 Malignant neoplasm of upper-outer quadrant of left female breast: Secondary | ICD-10-CM

## 2017-08-28 MED ORDER — ANASTROZOLE 1 MG PO TABS: 1.0000 mg | ORAL_TABLET | Freq: Every day | ORAL | 3 refills | Status: DC

## 2017-08-28 NOTE — Progress Notes (Signed)
Patient Care Team: Donald Prose, MD as PCP - General (Family Medicine) Sueanne Margarita, MD as Consulting Physician (Cardiology)  DIAGNOSIS:  Encounter Diagnosis  Name Primary?  . Malignant neoplasm of upper-outer quadrant of left breast in female, estrogen receptor positive (Tammy Cooper)     SUMMARY OF ONCOLOGIC HISTORY:   Breast cancer of upper-outer quadrant of left female breast (Tammy Cooper)   05/22/2014 Breast MRI    Left breast 12:00 position irregular nodular enhancement on 0.6 x 1.2 x 0.8 cm, no abnormal lymph nodes      07/31/2014 Surgery    Left breast lumpectomy: DCIS with calcifications 1 cm size ER 99%, PR 99%      09/29/2014 - 10/31/2014 Radiation Therapy    Adjuvant radiation      11/18/2014 -  Anti-estrogen oral therapy    anastrozole 1 mg daily 5 years       CHIEF COMPLIANT: Follow-up on anastrozole therapy  INTERVAL HISTORY: Tammy Cooper is a 67 year old with above-mentioned history of left breast cancer treated with lumpectomy radiation is currently on antiestrogen therapy with anastrozole.  She appears to be tolerating anastrozole extremely well.   Mood swings which got better with the Effexor.  She denies any lumps or nodules in the breast.  Chronic breast tenderness related to prior treatment. Continues to have severe hot flashes but she is not willing to change her treatment plan.  REVIEW OF SYSTEMS:   Constitutional: Denies fevers, chills or abnormal weight loss Eyes: Denies blurriness of vision Ears, nose, mouth, throat, and face: Denies mucositis or sore throat Respiratory: Denies cough, dyspnea or wheezes Cardiovascular: Denies palpitation, chest discomfort Gastrointestinal:  Denies nausea, heartburn or change in bowel habits Skin: Denies abnormal skin rashes Lymphatics: Denies new lymphadenopathy or easy bruising Neurological:Denies numbness, tingling or new weaknesses Behavioral/Psych: Mood is stable, no new changes  Extremities: No lower extremity  edema Breast: Mild breast discomfort All other systems were reviewed with the patient and are negative.  I have reviewed the past medical history, past surgical history, social history and family history with the patient and they are unchanged from previous note.  ALLERGIES:  is allergic to lipitor [atorvastatin] and adhesive [tape].  MEDICATIONS:  Current Outpatient Medications  Medication Sig Dispense Refill  . anastrozole (ARIMIDEX) 1 MG tablet Take 1 tablet (1 mg total) by mouth daily. 90 tablet 3  . aspirin EC 81 MG tablet Take 81 mg by mouth daily.    [name] Co-Enzyme Q10 200 MG CAPS Take 300 mg by mouth daily.     [name] ezetimibe (ZETIA) 10 MG tablet Take 1 tablet (10 mg total) by mouth daily. 90 tablet 3  . Multiple Vitamin (MULTIVITAMIN WITH MINERALS) TABS tablet Take 1 tablet by mouth daily.    . rosuvastatin (CRESTOR) 10 MG tablet Take 1 tablet (10 mg total) by mouth daily. 90 tablet 3  . telmisartan-hydrochlorothiazide (MICARDIS HCT) 40-12.5 MG tablet Take 1 tablet by mouth daily. Please keep upcoming appt for future refills. Thank you 90 tablet 3   No current facility-administered medications for this visit.     PHYSICAL EXAMINATION: ECOG PERFORMANCE STATUS: 1 - Symptomatic but completely ambulatory  Vitals:   08/28/17 0934  BP: (!) 120/51  Pulse: (!) 54  Resp: 18  Temp: 98.4 F (36.9 C)  SpO2: 95%   Filed Weights   08/28/17 0934  Weight: 220 lb 3.2 oz (99.9 kg)    GENERAL:alert, no distress and comfortable SKIN: skin color, texture, turgor are normal, no  rashes or significant lesions EYES: normal, Conjunctiva are pink and non-injected, sclera clear OROPHARYNX:no exudate, no erythema and lips, buccal mucosa, and tongue normal  NECK: supple, thyroid normal size, non-tender, without nodularity LYMPH:  no palpable lymphadenopathy in the cervical, axillary or inguinal LUNGS: clear to auscultation and percussion with normal breathing effort HEART: regular rate & rhythm  and no murmurs and no lower extremity edema ABDOMEN:abdomen soft, non-tender and normal bowel sounds MUSCULOSKELETAL:no cyanosis of digits and no clubbing  NEURO: alert & oriented x 3 with fluent speech, no focal motor/sensory deficits EXTREMITIES: No lower extremity edema BREAST: No palpable masses or nodules in either right or left breasts. No palpable axillary supraclavicular or infraclavicular adenopathy no breast tenderness or nipple discharge. (exam performed in the presence of a chaperone)  LABORATORY DATA:  I have reviewed the data as listed CMP Latest Ref Rng & Units 07/31/2017 12/02/2015 11/25/2015  Glucose 65 - 99 mg/dL 113(H) 180(H) 94  BUN 8 - 27 mg/dL 20 12 17   Creatinine 0.57 - 1.00 mg/dL 0.87 0.71 0.61  Sodium 134 - 144 mmol/L 139 137 137  Potassium 3.5 - 5.2 mmol/L 4.1 4.6 4.0  Chloride 96 - 106 mmol/L 99 104 104  CO2 20 - 29 mmol/L 23 28 25   Calcium 8.7 - 10.3 mg/dL 9.6 9.0 9.5  Total Protein 6.0 - 8.5 g/dL 7.0 - -  Total Bilirubin 0.0 - 1.2 mg/dL 0.4 - -  Alkaline Phos 39 - 117 IU/L 123(H) - -  AST 0 - 40 IU/L 28 - -  ALT 0 - 32 IU/L 28 - -    Lab Results  Component Value Date   WBC 11.9 (H) 12/02/2015   HGB 11.3 (L) 12/02/2015   HCT 34.3 (L) 12/02/2015   MCV 89.8 12/02/2015   PLT 255 12/02/2015   NEUTROABS 3.8 12/07/2006    ASSESSMENT & PLAN:  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 Prognosis: Based on [facility] prognostic nomogram, with antiestrogen therapy and radiation therapy her 5 year risk of recurrence of 1% in 10 year risk is a 2%. Adjuvant radiation completed 10/31/2014 Current treatment: Adjuvant anastrozole 1 mg daily ( chosen because of history of TIA) since 11/18/2014  Major depression: Much improved withEffexor XR 37.5 mg once daily. This has been helping both depression and hot flashes.  Anastrozole toxicities: 1. Hot flashes:  Continues to be a significant problem but she is willing to put up with them. 2. Mood swings: improved  with Effexor.   Breast Cancer Surveillance: 1. Breast exam  08/28/2017 did not reveal any suspicious findings 2. Mammogram  07/14/2017: No evidence of malignancy stable breast density category C   She is planning on going to take the river cruise on [place]  Return to clinic in 1 yrfor follow-up  No orders of the defined types were placed in this encounter.  The patient has a good understanding of the overall plan. she agrees with it. she will call with any problems that may develop before the next visit here.   Harriette Ohara, MD 08/28/17

## 2017-08-28 NOTE — Assessment & Plan Note (Signed)
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 Prognosis: Based on [facility] prognostic nomogram, with antiestrogen therapy and radiation therapy her 5 year risk of recurrence of 1% in 10 year risk is a 2%. Adjuvant radiation completed 10/31/2014 Current treatment: Adjuvant anastrozole 1 mg daily ( chosen because of history of TIA) since 11/18/2014  Major depression: Much improved withEffexor XR 37.5 mg once daily. This has been helping both depression and hot flashes.  Anastrozole toxicities: 1. Hot flashes: Much improved after Effexor was started 2. Mood swings are also improved  Breast Cancer Surveillance: 1. Breast exam  08/28/2017 did not reveal any suspicious findings 2. Mammogram  07/14/2017: No evidence of malignancy stable breast density category C   She is planning on going to take the river cruise on [place]  Return to clinic in 1 yrfor follow-up

## 2017-08-28 NOTE — Telephone Encounter (Signed)
Patient declined avs and calendar of upcoming may 2020 appointments.

## 2017-09-12 ENCOUNTER — Ambulatory Visit (HOSPITAL_COMMUNITY)
Admission: RE | Admit: 2017-09-12 | Discharge: 2017-09-12 | Disposition: A | Discharge status: Home / Self Care | Payer: Medicare Other | Source: Ambulatory Visit | Attending: Cardiology | Admitting: Cardiology

## 2017-09-12 DIAGNOSIS — R079 Chest pain, unspecified: Secondary | ICD-10-CM | POA: Diagnosis present

## 2017-09-12 DIAGNOSIS — I700 Atherosclerosis of aorta: Secondary | ICD-10-CM | POA: Diagnosis not present

## 2017-09-12 DIAGNOSIS — R0789 Other chest pain: Secondary | ICD-10-CM | POA: Diagnosis not present

## 2017-09-12 MED ORDER — NITROGLYCERIN 0.4 MG SL SUBL
SUBLINGUAL_TABLET | SUBLINGUAL | Status: AC
  Filled 2017-09-12: qty 1

## 2017-09-12 MED ORDER — IOPAMIDOL (ISOVUE-370) INJECTION 76%
INTRAVENOUS | Status: AC
  Filled 2017-09-12: qty 100

## 2017-09-12 MED ORDER — IOPAMIDOL (ISOVUE-370) INJECTION 76%
100.0000 mL | Freq: Once | INTRAVENOUS | Status: AC | PRN
  Administered 2017-09-12: 80 mL via INTRAVENOUS

## 2018-08-17 ENCOUNTER — Other Ambulatory Visit: Payer: Self-pay | Admitting: Hematology and Oncology

## 2018-08-17 DIAGNOSIS — Z853 Personal history of malignant neoplasm of breast: Secondary | ICD-10-CM

## 2018-08-24 ENCOUNTER — Other Ambulatory Visit: Payer: Self-pay | Admitting: Cardiology

## 2018-08-28 NOTE — Progress Notes (Signed)
Patient Care Team: Donald Prose, MD as PCP - General (Family Medicine) Sueanne Margarita, MD as Consulting Physician (Cardiology)  DIAGNOSIS:    ICD-10-CM   1. Malignant neoplasm of upper-outer quadrant of left breast in female, estrogen receptor positive ([place]) C50.412    Z17.0     SUMMARY OF ONCOLOGIC HISTORY:   Breast cancer of upper-outer quadrant of left female breast ([place])   05/22/2014 Breast MRI    Left breast 12:00 position irregular nodular enhancement on 0.6 x 1.2 x 0.8 cm, no abnormal lymph nodes    07/31/2014 Surgery    Left breast lumpectomy: DCIS with calcifications 1 cm size ER 99%, PR 99%    09/29/2014 - 10/31/2014 Radiation Therapy    Adjuvant radiation    11/18/2014 -  Anti-estrogen oral therapy    anastrozole 1 mg daily 5 years     CHIEF COMPLIANT: Follow-up on anastrozole therapy  INTERVAL HISTORY: Tammy Cooper is a 68 y.o. with above-mentioned history of left breast cancer treated with lumpectomy, radiation, and who is currently on antiestrogen therapy with anastrozole. I last saw her a year ago. She presents to the clinic today for annual follow-up. Her mammogram is scheduled for 08/30/18.  She is taking care of her grandchild 28 months old and has 2 other grandchildren [place].  She denies any lumps or nodules in the breast.  REVIEW OF SYSTEMS:   Constitutional: Denies fevers, chills or abnormal weight loss Eyes: Denies blurriness of vision Ears, nose, mouth, throat, and face: Denies mucositis or sore throat Respiratory: Denies cough, dyspnea or wheezes Cardiovascular: Denies palpitation, chest discomfort Gastrointestinal: Denies nausea, heartburn or change in bowel habits Skin: Denies abnormal skin rashes Lymphatics: Denies new lymphadenopathy or easy bruising Neurological: Denies numbness, tingling or new weaknesses Behavioral/Psych: Mood is stable, no new changes  Extremities: No lower extremity edema Breast: denies any pain or lumps or  nodules in either breasts All other systems were reviewed with the patient and are negative.  I have reviewed the past medical history, past surgical history, social history and family history with the patient and they are unchanged from previous note.  ALLERGIES:  is allergic to lipitor [atorvastatin] and adhesive [tape].  MEDICATIONS:  Current Outpatient Medications  Medication Sig Dispense Refill  . anastrozole (ARIMIDEX) 1 MG tablet Take 1 tablet (1 mg total) by mouth daily. 90 tablet 3  . aspirin EC 81 MG tablet Take 81 mg by mouth daily.    [name] Co-Enzyme Q10 200 MG CAPS Take 300 mg by mouth daily.     [name] ezetimibe (ZETIA) 10 MG tablet Take 1 tablet (10 mg total) by mouth daily. 90 tablet 3  . Multiple Vitamin (MULTIVITAMIN WITH MINERALS) TABS tablet Take 1 tablet by mouth daily.    . rosuvastatin (CRESTOR) 10 MG tablet Take 1 tablet (10 mg total) by mouth daily. 90 tablet 3  . telmisartan-hydrochlorothiazide (MICARDIS HCT) 40-12.5 MG tablet Take 1 tablet by mouth daily. Please call and schedule an appointment for further refills 1st attempt 90 tablet 0   No current facility-administered medications for this visit.     PHYSICAL EXAMINATION: ECOG PERFORMANCE STATUS: 0 - Asymptomatic  Vitals:   08/29/18 0925  BP: (!) 139/50  Pulse: 71  Resp: 17  Temp: 98.2 F (36.8 C)  SpO2: 98%   Filed Weights   08/29/18 0925  Weight: 221 lb (100.2 kg)    GENERAL: alert, no distress and comfortable SKIN: skin color, texture, turgor are normal, no rashes  or significant lesions EYES: normal, Conjunctiva are pink and non-injected, sclera clear OROPHARYNX: no exudate, no erythema and lips, buccal mucosa, and tongue normal  NECK: supple, thyroid normal size, non-tender, without nodularity LYMPH: no palpable lymphadenopathy in the cervical, axillary or inguinal LUNGS: clear to auscultation and percussion with normal breathing effort HEART: regular rate & rhythm and no murmurs and no lower  extremity edema ABDOMEN: abdomen soft, non-tender and normal bowel sounds MUSCULOSKELETAL: no cyanosis of digits and no clubbing  NEURO: alert & oriented x 3 with fluent speech, no focal motor/sensory deficits EXTREMITIES: No lower extremity edema   LABORATORY DATA:  I have reviewed the data as listed CMP Latest Ref Rng & Units 07/31/2017 12/02/2015 11/25/2015  Glucose 65 - 99 mg/dL 113(H) 180(H) 94  BUN 8 - 27 mg/dL 20 12 17   Creatinine 0.57 - 1.00 mg/dL 0.87 0.71 0.61  Sodium 134 - 144 mmol/L 139 137 137  Potassium 3.5 - 5.2 mmol/L 4.1 4.6 4.0  Chloride 96 - 106 mmol/L 99 104 104  CO2 20 - 29 mmol/L 23 28 25   Calcium 8.7 - 10.3 mg/dL 9.6 9.0 9.5  Total Protein 6.0 - 8.5 g/dL 7.0 - -  Total Bilirubin 0.0 - 1.2 mg/dL 0.4 - -  Alkaline Phos 39 - 117 IU/L 123(H) - -  AST 0 - 40 IU/L 28 - -  ALT 0 - 32 IU/L 28 - -    Lab Results  Component Value Date   WBC 11.9 (H) 12/02/2015   HGB 11.3 (L) 12/02/2015   HCT 34.3 (L) 12/02/2015   MCV 89.8 12/02/2015   PLT 255 12/02/2015   NEUTROABS 3.8 12/07/2006    ASSESSMENT & PLAN:  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 Prognosis: Based on [facility] prognostic nomogram, with antiestrogen therapy and radiation therapy her 5 year risk of recurrence of 1% in 10 year risk is a 2%. Adjuvant radiation completed 10/31/2014 Current treatment: Adjuvant anastrozole 1 mg daily ( chosen because of history of TIA) since 11/18/2014  Major depression: Much improved withEffexor XR 37.5 mg once daily. This has been helping both depression and hot flashes.  Anastrozole toxicities: 1. Hot flashes: Continues to be a significant problem but she is willing to put up with them. 2. Mood swings: improved  with Effexor.   Breast Cancer Surveillance: 1. Breast exam 5/6/2020did not reveal any suspicious findings 2. Mammogram 07/15/2018: No evidence  of malignancy stable breast density category C  She is planning on going to [place] on [address]  Return to clinic in 1 yrfor follow-up    No orders of the defined types were placed in this encounter.  The patient has a good understanding of the overall plan. she agrees with it. she will call with any problems that may develop before the next visit here.  Nicholas Lose, MD 08/29/2018  Julious Oka Dorshimer am acting as scribe for Dr. Nicholas Lose.  I have reviewed the above documentation for accuracy and completeness, and I agree with the above.

## 2018-08-29 ENCOUNTER — Inpatient Hospital Stay: Payer: Medicare Other | Attending: Hematology and Oncology | Admitting: Hematology and Oncology

## 2018-08-29 ENCOUNTER — Other Ambulatory Visit: Payer: Self-pay

## 2018-08-29 DIAGNOSIS — C50412 Malignant neoplasm of upper-outer quadrant of left female breast: Secondary | ICD-10-CM

## 2018-08-29 DIAGNOSIS — Z170 Estrogen receptor positive status [ER+]: Secondary | ICD-10-CM | POA: Diagnosis not present

## 2018-08-29 DIAGNOSIS — N951 Menopausal and female climacteric states: Secondary | ICD-10-CM | POA: Diagnosis not present

## 2018-08-29 DIAGNOSIS — F329 Major depressive disorder, single episode, unspecified: Secondary | ICD-10-CM | POA: Insufficient documentation

## 2018-08-29 DIAGNOSIS — Z923 Personal history of irradiation: Secondary | ICD-10-CM | POA: Diagnosis not present

## 2018-08-29 DIAGNOSIS — D0512 Intraductal carcinoma in situ of left breast: Secondary | ICD-10-CM | POA: Diagnosis not present

## 2018-08-29 DIAGNOSIS — Z8673 Personal history of transient ischemic attack (TIA), and cerebral infarction without residual deficits: Secondary | ICD-10-CM | POA: Diagnosis not present

## 2018-08-29 DIAGNOSIS — Z79811 Long term (current) use of aromatase inhibitors: Secondary | ICD-10-CM | POA: Insufficient documentation

## 2018-08-29 MED ORDER — ANASTROZOLE 1 MG PO TABS: 1.0000 mg | ORAL_TABLET | Freq: Every day | ORAL | 3 refills | Status: DC

## 2018-08-29 NOTE — Assessment & Plan Note (Signed)
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 Prognosis: Based on [facility] prognostic nomogram, with antiestrogen therapy and radiation therapy her 5 year risk of recurrence of 1% in 10 year risk is a 2%. Adjuvant radiation completed 10/31/2014 Current treatment: Adjuvant anastrozole 1 mg daily ( chosen because of history of TIA) since 11/18/2014  Major depression: Much improved withEffexor XR 37.5 mg once daily. This has been helping both depression and hot flashes.  Anastrozole toxicities: 1. Hot flashes: Continues to be a significant problem but she is willing to put up with them. 2. Mood swings: improved  with Effexor.   Breast Cancer Surveillance: 1. Breast exam 5/6/2020did not reveal any suspicious findings 2. Mammogram 07/15/2018: No evidence of malignancy stable breast density category C  She is planning on going to [place] on [address]  Return to clinic in 1 yrfor follow-up

## 2018-08-30 ENCOUNTER — Ambulatory Visit
Admission: RE | Admit: 2018-08-30 | Discharge: 2018-08-30 | Disposition: A | Discharge status: Home / Self Care | Payer: Medicare Other | Source: Ambulatory Visit | Attending: Hematology and Oncology | Admitting: Hematology and Oncology

## 2018-08-30 DIAGNOSIS — Z853 Personal history of malignant neoplasm of breast: Secondary | ICD-10-CM

## 2018-09-04 ENCOUNTER — Telehealth: Payer: Self-pay | Admitting: Cardiology

## 2018-09-04 NOTE — Telephone Encounter (Signed)
Pt. Says that she would rather have a phone visit. She also says that she is due for blood work and she wants to know if we can schedule her for blood work the same day as her virtual visit.     Virtual Visit Pre-Appointment Phone Call  "(Name), I am calling you today to discuss your upcoming appointment. We are currently trying to limit exposure to the virus that causes COVID-19 by seeing patients at home rather than in the office."  1. "What is the BEST phone number to call the day of the visit?" - include this in appointment notes  2. Do you have or have access to (through a family member/friend) a smartphone with video capability that we can use for your visit?" a. If yes - list this number in appt notes as cell (if different from BEST phone #) and list the appointment type as a VIDEO visit in appointment notes b. If no - list the appointment type as a PHONE visit in appointment notes  3. Confirm consent - "In the setting of the current Covid19 crisis, you are scheduled for a (phone or video) visit with your provider on (date) at (time).  Just as we do with many in-office visits, in order for you to participate in this visit, we must obtain consent.  If you'd like, I can send this to your mychart (if signed up) or email for you to review.  Otherwise, I can obtain your verbal consent now.  All virtual visits are billed to your insurance company just like a normal visit would be.  By agreeing to a virtual visit, we'd like you to understand that the technology does not allow for your provider to perform an examination, and thus may limit your provider's ability to fully assess your condition. If your provider identifies any concerns that need to be evaluated in person, we will make arrangements to do so.  Finally, though the technology is pretty good, we cannot assure that it will always work on either your or our end, and in the setting of a video visit, we may have to convert it to a phone-only  visit.  In either situation, we cannot ensure that we have a secure connection.  Are you willing to proceed?"  YES  4. Advise patient to be prepared - "Two hours prior to your appointment, go ahead and check your blood pressure, pulse, oxygen saturation, and your weight (if you have the equipment to check those) and write them all down. When your visit starts, your provider will ask you for this information. If you have an Apple Watch or Kardia device, please plan to have heart rate information ready on the day of your appointment. Please have a pen and paper handy nearby the day of the visit as well."  5. Give patient instructions for MyChart download to smartphone OR Doximity/Doxy.me as below if video visit (depending on what platform provider is using)  6. Inform patient they will receive a phone call 15 minutes prior to their appointment time (may be from unknown caller ID) so they should be prepared to answer    TELEPHONE CALL NOTE  Carylon Tamburro Hamilton-Smith has been deemed a candidate for a follow-up tele-health visit to limit community exposure during the Covid-19 pandemic. I spoke with the patient via phone to ensure availability of phone/video source, confirm preferred email & phone number, and discuss instructions and expectations.  I reminded Dalani Mette Hamilton-Smith to be prepared with any vital sign  and/or heart rhythm information that could potentially be obtained via home monitoring, at the time of her visit. I reminded Maggie Senseney Hamilton-Smith to expect a phone call prior to her visit.  Minus Liberty 09/04/2018 4:02 PM   INSTRUCTIONS FOR DOWNLOADING THE MYCHART APP TO SMARTPHONE  - The patient must first make sure to have activated MyChart and know their login information - If Apple, go to [employer] and type in MyChart in the search bar and download the app. If Android, ask patient to go to [employer] and type in [place] in the search bar and download the app. The app  is free but as with any other app downloads, their phone may require them to verify saved payment information or Apple/Android password.  - The patient will need to then log into the app with their MyChart username and password, and select [facility] as their healthcare provider to link the account. When it is time for your visit, go to the MyChart app, find appointments, and click Begin Video Visit. Be sure to Select Allow for your device to access the Microphone and Camera for your visit. You will then be connected, and your provider will be with you shortly.  **If they have any issues connecting, or need assistance please contact MyChart service desk [phone])**  **If using a computer, in order to ensure the best quality for their visit they will need to use either of the following Internet Browsers: [employer], or Google Chrome**  IF USING DOXIMITY or DOXY.ME - The patient will receive a link just prior to their visit by text.     FULL LENGTH CONSENT FOR TELE-HEALTH VISIT   I hereby voluntarily request, consent and authorize [facility] and its employed or contracted physicians, physician assistants, nurse practitioners or other licensed health care professionals (the Practitioner), to provide me with telemedicine health care services (the Services") as deemed necessary by the treating Practitioner. I acknowledge and consent to receive the Services by the Practitioner via telemedicine. I understand that the telemedicine visit will involve communicating with the Practitioner through live audiovisual communication technology and the disclosure of certain medical information by electronic transmission. I acknowledge that I have been given the opportunity to request an in-person assessment or other available alternative prior to the telemedicine visit and am voluntarily participating in the telemedicine visit.  I understand that I have the right to withhold or withdraw my  consent to the use of telemedicine in the course of my care at any time, without affecting my right to future care or treatment, and that the Practitioner or I may terminate the telemedicine visit at any time. I understand that I have the right to inspect all information obtained and/or recorded in the course of the telemedicine visit and may receive copies of available information for a reasonable fee.  I understand that some of the potential risks of receiving the Services via telemedicine include:   Delay or interruption in medical evaluation due to technological equipment failure or disruption;  Information transmitted may not be sufficient (e.g. poor resolution of images) to allow for appropriate medical decision making by the Practitioner; and/or   In rare instances, security protocols could fail, causing a breach of personal health information.  Furthermore, I acknowledge that it is my responsibility to provide information about my medical history, conditions and care that is complete and accurate to the best of my ability. I acknowledge that Practitioner's advice, recommendations, and/or decision may be based  on factors not within their control, such as incomplete or inaccurate data provided by me or distortions of diagnostic images or specimens that may result from electronic transmissions. I understand that the practice of medicine is not an exact science and that Practitioner makes no warranties or guarantees regarding treatment outcomes. I acknowledge that I will receive a copy of this consent concurrently upon execution via email to the email address I last provided but may also request a printed copy by calling the office of [facility].    I understand that my insurance will be billed for this visit.   I have read or had this consent read to me.  I understand the contents of this consent, which adequately explains the benefits and risks of the Services being provided via telemedicine.    I have been provided ample opportunity to ask questions regarding this consent and the Services and have had my questions answered to my satisfaction.  I give my informed consent for the services to be provided through the use of telemedicine in my medical care  By participating in this telemedicine visit I agree to the above.

## 2018-09-05 NOTE — Progress Notes (Signed)
Virtual Visit via Video Note   This visit type was conducted due to national recommendations for restrictions regarding the COVID-19 Pandemic (e.g. social distancing) in an effort to limit this patient's exposure and mitigate transmission in our community.  Due to her co-morbid illnesses, this patient is at least at moderate risk for complications without adequate follow up.  This format is felt to be most appropriate for this patient at this time.  All issues noted in this document were discussed and addressed.  A limited physical exam was performed with this format.  Please refer to the patient's chart for her consent to telehealth for [facility].   Evaluation Performed:  Follow-up visit  This visit type was conducted due to national recommendations for restrictions regarding the COVID-19 Pandemic (e.g. social distancing).  This format is felt to be most appropriate for this patient at this time.  All issues noted in this document were discussed and addressed.  No physical exam was performed (except for noted visual exam findings with Video Visits).  Please refer to the patient's chart (MyChart message for video visits and phone note for telephone visits) for the patient's consent to telehealth for [facility].  Date:  09/06/2018   ID:  [place], DOB 12-07-1950, MRN [id number]  Patient Location:  Home  Provider location:   [place]  PCP:  Donald Prose, MD  Cardiologist:   Electrophysiologist:  None   Chief Complaint:  Hypertension and Hyperlipidemia  History of Present Illness:    Tammy Cooper is a 68 y.o. female who presents via audio/video conferencing for a telehealth visit today.    Tammy Cooper is a 68 y.o. female with a hx of HTN and dyslipidemia.  She is here today for followup and is doing well.  She denies any chest pain or pressure, SOB, DOE, PND, orthopnea, LE edema, dizziness, palpitations or syncope. She is compliant with  her meds and is tolerating meds with no SE.    The patient does not have symptoms concerning for COVID-19 infection (fever, chills, cough, or new shortness of breath).    Prior CV studies:   The following studies were reviewed today:  none  Past Medical History:  Diagnosis Date  . Anxiety   . Arthritis   . Breast cancer ([place]) 05/28/14   Left breast in situ   . Complication of anesthesia   . Constipation    occasiopnal  . History of breast cancer 2016  . History of recurrent cystitis   . Hypercholesteremia   . Hypertension   . PONV (postoperative nausea and vomiting)    "a little nausea"  . Spinal stenosis   . Stroke [facility])    H/O TIA in 2006, MRA clear,arteriogram was done which was normal   Past Surgical History:  Procedure Laterality Date  . anal skin tag removal  10/21/10  . BACK SURGERY  07/12/2010   stenosis L4 + L5  . BLADDER TACK     WITH THE HYSTERECTOMY  . BREAST BIOPSY Left 06/13/2014   Benign  . BREAST BIOPSY Left 05/28/2014   High Risk  . BREAST LUMPECTOMY Left 07/31/2014  . BREAST LUMPECTOMY WITH RADIOACTIVE SEED LOCALIZATION Left 07/31/2014   Procedure: LEFT BREAST LUMPECTOMY WITH RADIOACTIVE SEED LOCALIZATION;  Surgeon: Coralie Keens, MD;  Location: [facility];  Service: General;  Laterality: Left;  . COLONOSCOPY    . HEMORROIDECTOMY    . KNEE ARTHROSCOPY  2010   left  . ROTATOR CUFF  REPAIR  2005   rt shoulder  . TONSILLECTOMY    . TOTAL KNEE ARTHROPLASTY Right 08/04/2015   Procedure: RIGHT TOTAL KNEE ARTHROPLASTY;  Surgeon: Paralee Cancel, MD;  Location: [facility];  Service: Orthopedics;  Laterality: Right;  . TOTAL KNEE ARTHROPLASTY Left 12/01/2015   Procedure: LEFT TOTAL KNEE ARTHROPLASTY;  Surgeon: Paralee Cancel, MD;  Location: [facility];  Service: Orthopedics;  Laterality: Left;  [name] VAGINAL HYSTERECTOMY  2003   total hyst-BSO-Ant/post repair     Current Meds  Medication Sig  . anastrozole (ARIMIDEX) 1 MG tablet Take 1 tablet (1 mg total)  by mouth daily.  [name] aspirin EC 81 MG tablet Take 81 mg by mouth daily.  [name] Co-Enzyme Q10 200 MG CAPS Take 300 mg by mouth daily.   [name] ezetimibe (ZETIA) 10 MG tablet Take 1 tablet (10 mg total) by mouth daily.  . Multiple Vitamin (MULTIVITAMIN WITH MINERALS) TABS tablet Take 1 tablet by mouth daily.  . rosuvastatin (CRESTOR) 10 MG tablet Take 1 tablet (10 mg total) by mouth daily.  [name] telmisartan-hydrochlorothiazide (MICARDIS HCT) 40-12.5 MG tablet Take 1 tablet by mouth daily. Please call and schedule an appointment for further refills 1st attempt     Allergies:   Lipitor [atorvastatin] and Adhesive [tape]   Social History   Tobacco Use  . Smoking status: Never Smoker  . Smokeless tobacco: Never Used  Substance Use Topics  . Alcohol use: Yes    Comment: ocassionally  . Drug use: No     Family Hx: The patient's family history includes CAD in her brother; Cancer in her father; Hypertension in her mother and sister. There is no history of Heart attack.  ROS:   Please see the history of present illness.     All other systems reviewed and are negative.   Labs/Other Tests and Data Reviewed:    Recent Labs: No results found for requested labs within last 8760 hours.   Recent Lipid Panel Lab Results  Component Value Date/Time   CHOL 149 07/31/2017 09:39 AM   CHOL 140 10/23/2015 07:34 AM   CHOL 160 09/02/2014 07:30 AM   TRIG 158 (H) 07/31/2017 09:39 AM   TRIG 139 10/23/2015 07:34 AM   TRIG 206 (H) 09/02/2014 07:30 AM   HDL 48 07/31/2017 09:39 AM   HDL 53 10/23/2015 07:34 AM   HDL 55 09/02/2014 07:30 AM   CHOLHDL 3.1 07/31/2017 09:39 AM   CHOLHDL 2.6 10/23/2015 07:34 AM   CHOLHDL 3.1 CALC 05/29/2006 10:13 AM   LDLCALC 69 07/31/2017 09:39 AM   LDLCALC 59 10/23/2015 07:34 AM   LDLCALC 64 09/02/2014 07:30 AM    Wt Readings from Last 3 Encounters:  09/06/18 217 lb (98.4 kg)  08/29/18 221 lb (100.2 kg)  08/28/17 220 lb 3.2 oz (99.9 kg)     Objective:    Vital Signs:  Ht  5\' 3"  (1.6 m)   Wt 217 lb (98.4 kg)   BMI 38.44 kg/m    CONSTITUTIONAL:  Well nourished, well developed female in no acute distress.  EYES: anicteric MOUTH: oral mucosa is pink RESPIRATORY: Normal respiratory effort, symmetric expansion CARDIOVASCULAR: No peripheral edema SKIN: No rash, lesions or ulcers MUSCULOSKELETAL: no digital cyanosis NEURO: Cranial Nerves II-XII grossly intact, moves all extremities PSYCH: Intact judgement and insight.  A&O x 3, Mood/affect appropriate   ASSESSMENT & PLAN:    1.  Hypertension - She occasionally checks her BP and it is always normal.  She will continue on Telmisartan-HCT  40-12.5mg  daily.  I will check a BMET at the time of her lipids.   2.  Hyperlipidemia - Her LDL goal is < 70.  Her LDL was a year ago.  I will repeat an FLP and ALT in August once COVID 19 has improved.  She will continue on Zetia 10mg  daily and Crestor 10mg  daily.    3.  Obesity -She thinks that she has gained some weight. I have encouraged her to get into a routine exercise program and cut back on carbs and portions.   4.  COVID-19 Education:The signs and symptoms of COVID-19 were discussed with the patient and how to seek care for testing (follow up with PCP or arrange E-visit).  The importance of social distancing was discussed today.  Patient Risk:   After full review of this patient's clinical status, I feel that they are at least moderate risk at this time.  Time:   Today, I have spent 15 minutes directly with the patient on video discussing medical problems including HTN and lipids.  We also reviewed the symptoms of COVID 19 and the ways to protect against contracting the virus with telehealth technology.  I spent an additional 5  minutes reviewing patient's chart including labs.  Medication Adjustments/Labs and Tests Ordered: Current medicines are reviewed at length with the patient today.  Concerns regarding medicines are outlined above.  Tests Ordered: No orders of  the defined types were placed in this encounter.  Medication Changes: No orders of the defined types were placed in this encounter.   Disposition:  Follow up in 1 year(s)  Signed, Fransico Him, MD  09/06/2018 8:16 AM    [facility]

## 2018-09-06 ENCOUNTER — Other Ambulatory Visit: Payer: Self-pay

## 2018-09-06 ENCOUNTER — Encounter: Payer: Self-pay | Admitting: Cardiology

## 2018-09-06 ENCOUNTER — Telehealth (INDEPENDENT_AMBULATORY_CARE_PROVIDER_SITE_OTHER): Payer: Medicare Other | Admitting: Cardiology

## 2018-09-06 VITALS — Ht 63.0 in | Wt 217.0 lb

## 2018-09-06 DIAGNOSIS — I10 Essential (primary) hypertension: Secondary | ICD-10-CM

## 2018-09-06 DIAGNOSIS — E7800 Pure hypercholesterolemia, unspecified: Secondary | ICD-10-CM

## 2018-09-06 MED ORDER — ROSUVASTATIN CALCIUM 10 MG PO TABS: 10.0000 mg | ORAL_TABLET | Freq: Every day | ORAL | 3 refills | Status: DC

## 2018-09-06 MED ORDER — TELMISARTAN-HCTZ 40-12.5 MG PO TABS: 1.0000 | ORAL_TABLET | Freq: Every day | ORAL | 3 refills | Status: DC

## 2018-09-06 MED ORDER — EZETIMIBE 10 MG PO TABS: 10.0000 mg | ORAL_TABLET | Freq: Every day | ORAL | 3 refills | Status: DC

## 2018-09-06 NOTE — Patient Instructions (Addendum)
Medication Instructions:  Your physician recommends that you continue on your current medications as directed. Please refer to the Current Medication list given to you today.  If you need a refill on your cardiac medications before your next appointment, please call your pharmacy.   Lab work: Your physician recommends that you return for a FASTING lipid profile, basic metabolic panel and ALT on 12/12/18 at 7:45 AM   If you have labs (blood work) drawn today and your tests are completely normal, you will receive your results only by: [name] MyChart Message (if you have MyChart) OR . A paper copy in the mail If you have any lab test that is abnormal or we need to change your treatment, we will call you to review the results.  Testing/Procedures: None ordered  Follow-Up: At [facility], you and your health needs are our priority.  As part of our continuing mission to provide you with exceptional heart care, we have created designated Provider Care Teams.  These Care Teams include your primary Cardiologist (physician) and Advanced Practice Providers (APPs -  Physician Assistants and Nurse Practitioners) who all work together to provide you with the care you need, when you need it. . You will need a follow up appointment in 1 year.  Please call our office 2 months in advance to schedule this appointment.  You may see Fransico Him, MD or one of the following Advanced Practice Providers on your designated Care Team:   . Lyda Jester, PA-C . Dayna Dunn, PA-C . Ermalinda Barrios, PA-C  Any Other Special Instructions Will Be Listed Below (If Applicable).

## 2018-12-12 ENCOUNTER — Other Ambulatory Visit: Payer: Medicare Other | Admitting: *Deleted

## 2018-12-12 ENCOUNTER — Other Ambulatory Visit: Payer: Self-pay

## 2018-12-12 DIAGNOSIS — E7800 Pure hypercholesterolemia, unspecified: Secondary | ICD-10-CM

## 2018-12-12 DIAGNOSIS — I10 Essential (primary) hypertension: Secondary | ICD-10-CM

## 2018-12-12 LAB — LIPID PANEL
Chol/HDL Ratio: 2.9 ratio (ref 0.0–4.4)
Cholesterol, Total: 144 mg/dL (ref 100–199)
HDL: 49 mg/dL (ref >39)
LDL Calculated: 70 mg/dL (ref 0–99)
Triglycerides: 126 mg/dL (ref 0–149)
VLDL Cholesterol Cal: 25 mg/dL (ref 5–40)

## 2018-12-12 LAB — BASIC METABOLIC PANEL
BUN/Creatinine Ratio: 21 (ref 12–28)
BUN: 16 mg/dL (ref 8–27)
CO2: 21 mmol/L (ref 20–29)
Calcium: 9.8 mg/dL (ref 8.7–10.3)
Chloride: 101 mmol/L (ref 96–106)
Creatinine, Ser: 0.76 mg/dL (ref 0.57–1.00)
GFR calc Af Amer: 92 mL/min/{1.73_m2} (ref >59)
GFR calc non Af Amer: 80 mL/min/{1.73_m2} (ref >59)
Glucose: 112 mg/dL — ABNORMAL HIGH (ref 65–99)
Potassium: 4.2 mmol/L (ref 3.5–5.2)
Sodium: 141 mmol/L (ref 134–144)

## 2018-12-12 LAB — ALT: ALT: 25 IU/L (ref 0–32)

## 2018-12-13 ENCOUNTER — Telehealth: Payer: Self-pay

## 2018-12-13 NOTE — Telephone Encounter (Signed)
The patient has been notified of the result and verbalized understanding.  All questions (if any) were answered. meds refilled. Wilma Flavin, RN 12/13/2018 8:25 AM

## 2019-02-20 ENCOUNTER — Other Ambulatory Visit: Payer: Self-pay | Admitting: Hematology and Oncology

## 2019-02-20 ENCOUNTER — Other Ambulatory Visit: Payer: Self-pay | Admitting: Family Medicine

## 2019-02-20 DIAGNOSIS — E2839 Other primary ovarian failure: Secondary | ICD-10-CM

## 2019-02-20 DIAGNOSIS — Z9889 Other specified postprocedural states: Secondary | ICD-10-CM

## 2019-04-24 ENCOUNTER — Ambulatory Visit: Payer: Medicare Other | Attending: Internal Medicine

## 2019-04-24 DIAGNOSIS — Z20822 Contact with and (suspected) exposure to covid-19: Secondary | ICD-10-CM

## 2019-04-25 LAB — NOVEL CORONAVIRUS, NAA: SARS-CoV-2, NAA: DETECTED — AB

## 2019-04-27 ENCOUNTER — Telehealth: Payer: Self-pay | Admitting: Infectious Diseases

## 2019-04-27 ENCOUNTER — Other Ambulatory Visit: Payer: Self-pay | Admitting: Infectious Diseases

## 2019-04-27 DIAGNOSIS — U071 COVID-19: Secondary | ICD-10-CM

## 2019-04-27 NOTE — Progress Notes (Signed)
See phone note from 04/27/19

## 2019-04-27 NOTE — Telephone Encounter (Signed)
  I connected by phone with Tammy Cooper on 04/27/2019 at 11:38 AM to discuss the potential use of an new treatment for mild to moderate COVID-19 viral infection in non-hospitalized patients.  This patient is a 69 y.o. female that meets the FDA criteria for Emergency Use Authorization of bamlanivimab or casirivimab\imdevimab.  Has a (+) direct SARS-CoV-2 viral test result  Has mild or moderate COVID-19   Is ? 71 years of age and weighs ? 40 kg  Is NOT hospitalized due to COVID-19  Is NOT requiring oxygen therapy or requiring an increase in baseline oxygen flow rate due to COVID-19  Is within 10 days of symptom onset  Has at least one of the high risk factor(s) for progression to severe COVID-19 and/or hospitalization as defined in EUA.  Specific high risk criteria : >/= 71 yo   I have spoken and communicated the following to the patient or parent/caregiver:  1. FDA has authorized the emergency use of bamlanivimab and casirivimab\imdevimab for the treatment of mild to moderate COVID-19 in adults and pediatric patients with positive results of direct SARS-CoV-2 viral testing who are 55 years of age and older weighing at least 40 kg, and who are at high risk for progressing to severe COVID-19 and/or hospitalization.  2. The significant known and potential risks and benefits of bamlanivimab and casirivimab\imdevimab, and the extent to which such potential risks and benefits are unknown.  3. Information on available alternative treatments and the risks and benefits of those alternatives, including clinical trials.  4. Patients treated with bamlanivimab and casirivimab\imdevimab should continue to self-isolate and use infection control measures (e.g., wear mask, isolate, social distance, avoid sharing personal items, clean and disinfect "high touch" surfaces, and frequent handwashing) according to CDC guidelines.   5. The patient or parent/caregiver has the option to accept or  refuse bamlanivimab or casirivimab\imdevimab .  After reviewing this information with the patient, The patient agreed to proceed with receiving the bamlanimivab infusion and will be provided a copy of the Fact sheet prior to receiving the infusion.Tammy Cooper 04/27/2019 11:38 AM

## 2019-04-30 ENCOUNTER — Other Ambulatory Visit: Payer: Medicare Other

## 2019-05-01 ENCOUNTER — Ambulatory Visit (HOSPITAL_COMMUNITY)
Admission: RE | Admit: 2019-05-01 | Discharge: 2019-05-01 | Disposition: A | Discharge status: Home / Self Care | Payer: Medicare Other | Source: Ambulatory Visit | Attending: Pulmonary Disease | Admitting: Pulmonary Disease

## 2019-05-01 DIAGNOSIS — U071 COVID-19: Secondary | ICD-10-CM | POA: Diagnosis present

## 2019-05-01 DIAGNOSIS — Z23 Encounter for immunization: Secondary | ICD-10-CM | POA: Diagnosis not present

## 2019-05-01 MED ORDER — METHYLPREDNISOLONE SODIUM SUCC 125 MG IJ SOLR: 125.0000 mg | Freq: Once | INTRAMUSCULAR | Status: DC | PRN

## 2019-05-01 MED ORDER — SODIUM CHLORIDE 0.9 % IV SOLN
INTRAVENOUS | Status: DC | PRN
  Administered 2019-05-01: 17:00:00 250 mL via INTRAVENOUS

## 2019-05-01 MED ORDER — EPINEPHRINE 0.3 MG/0.3ML IJ SOAJ: 0.3000 mg | Freq: Once | INTRAMUSCULAR | Status: DC | PRN

## 2019-05-01 MED ORDER — FAMOTIDINE IN NACL 20-0.9 MG/50ML-% IV SOLN: 20.0000 mg | Freq: Once | INTRAVENOUS | Status: DC | PRN

## 2019-05-01 MED ORDER — ALBUTEROL SULFATE HFA 108 (90 BASE) MCG/ACT IN AERS: 2.0000 | INHALATION_SPRAY | Freq: Once | RESPIRATORY_TRACT | Status: DC | PRN

## 2019-05-01 MED ORDER — DIPHENHYDRAMINE HCL 50 MG/ML IJ SOLN: 50.0000 mg | Freq: Once | INTRAMUSCULAR | Status: DC | PRN

## 2019-05-01 MED ORDER — SODIUM CHLORIDE 0.9 % IV SOLN
700.0000 mg | Freq: Once | INTRAVENOUS | Status: AC
  Administered 2019-05-01: 700 mg via INTRAVENOUS
  Filled 2019-05-01: qty 20

## 2019-05-01 NOTE — Progress Notes (Signed)
  Diagnosis: COVID-19  Physician:Dr Joya Gaskins   Procedure: [facility] Med: bamlanivimab infusion - Provided patient with bamlanimivab fact sheet for patients, parents and caregivers prior to infusion.  Complications: No immediate complications noted.  Discharge: Discharged home   Dewaine Oats 05/01/2019

## 2019-05-01 NOTE — Discharge Instructions (Signed)

## 2019-05-10 ENCOUNTER — Encounter: Payer: Self-pay | Admitting: Hematology and Oncology

## 2019-06-11 ENCOUNTER — Ambulatory Visit
Admission: RE | Admit: 2019-06-11 | Discharge: 2019-06-11 | Disposition: A | Discharge status: Home / Self Care | Payer: Medicare PPO | Source: Ambulatory Visit | Attending: Family Medicine | Admitting: Family Medicine

## 2019-06-11 ENCOUNTER — Other Ambulatory Visit: Payer: Self-pay

## 2019-06-11 DIAGNOSIS — E2839 Other primary ovarian failure: Secondary | ICD-10-CM

## 2019-08-01 ENCOUNTER — Encounter: Payer: Self-pay | Admitting: Hematology and Oncology

## 2019-08-28 DIAGNOSIS — E7800 Pure hypercholesterolemia, unspecified: Secondary | ICD-10-CM | POA: Diagnosis not present

## 2019-08-29 ENCOUNTER — Ambulatory Visit: Payer: Medicare Other | Admitting: Hematology and Oncology

## 2019-09-02 ENCOUNTER — Ambulatory Visit
Admission: RE | Admit: 2019-09-02 | Discharge: 2019-09-02 | Disposition: A | Discharge status: Home / Self Care | Payer: Medicare PPO | Source: Ambulatory Visit | Attending: Hematology and Oncology | Admitting: Hematology and Oncology

## 2019-09-02 ENCOUNTER — Other Ambulatory Visit: Payer: Self-pay

## 2019-09-02 DIAGNOSIS — Z9889 Other specified postprocedural states: Secondary | ICD-10-CM

## 2019-09-02 DIAGNOSIS — R922 Inconclusive mammogram: Secondary | ICD-10-CM | POA: Diagnosis not present

## 2019-09-02 DIAGNOSIS — Z853 Personal history of malignant neoplasm of breast: Secondary | ICD-10-CM | POA: Diagnosis not present

## 2019-09-04 NOTE — Progress Notes (Signed)
Patient Care Team: Donald Prose, MD as PCP - General (Family Medicine) Sueanne Margarita, MD as Consulting Physician (Cardiology)  DIAGNOSIS:    ICD-10-CM   1. Malignant neoplasm of upper-outer quadrant of left breast in female, estrogen receptor positive ([place])  C50.412    Z17.0     SUMMARY OF ONCOLOGIC HISTORY: Oncology History  Breast cancer of upper-outer quadrant of left female breast ([place])  05/22/2014 Breast MRI   Left breast 12:00 position irregular nodular enhancement on 0.6 x 1.2 x 0.8 cm, no abnormal lymph nodes   07/31/2014 Surgery   Left breast lumpectomy: DCIS with calcifications 1 cm size ER 99%, PR 99%   09/29/2014 - 10/31/2014 Radiation Therapy   Adjuvant radiation   11/18/2014 -  Anti-estrogen oral therapy   anastrozole 1 mg daily 5 years     CHIEF COMPLIANT: Follow-up of left breast cancer on anastrozole therapy  INTERVAL HISTORY: Tammy Cooper is a 69 y.o. with above-mentioned history of left breast cancer treated with lumpectomy, radiation, and who is currently on antiestrogen therapy with anastrozole. Bone density scan on 06/11/19 showed normal bone density with a T-score of -0.9 at the forearm radius. Mammogram on 09/02/19 showed no evidence of malignancy bilaterally. She presents to the clinic today for annual follow-up.  She is doing extremely well.  She lost 60 pounds by eating healthy and exercising regularly.  ALLERGIES:  is allergic to lipitor [atorvastatin] and adhesive [tape].  MEDICATIONS:  Current Outpatient Medications  Medication Sig Dispense Refill  . anastrozole (ARIMIDEX) 1 MG tablet Take 1 tablet (1 mg total) by mouth daily. 90 tablet 3  . aspirin EC 81 MG tablet Take 81 mg by mouth daily.    [name] Co-Enzyme Q10 200 MG CAPS Take 300 mg by mouth daily.     [name] ezetimibe (ZETIA) 10 MG tablet Take 1 tablet (10 mg total) by mouth daily. 90 tablet 3  . Multiple Vitamin (MULTIVITAMIN WITH MINERALS) TABS tablet Take 1 tablet by mouth daily.    .  rosuvastatin (CRESTOR) 10 MG tablet Take 1 tablet (10 mg total) by mouth daily. 90 tablet 3  . telmisartan-hydrochlorothiazide (MICARDIS HCT) 40-12.5 MG tablet Take 1 tablet by mouth daily. 90 tablet 3   No current facility-administered medications for this visit.    PHYSICAL EXAMINATION: ECOG PERFORMANCE STATUS: 0 - Asymptomatic  Vitals:   09/05/19 1021  BP: (!) 145/47  Pulse: (!) 44  Resp: 18  Temp: 98.2 F (36.8 C)  SpO2: 100%   Filed Weights   09/05/19 1021  Weight: 161 lb 3.2 oz (73.1 kg)    BREAST: No palpable masses or nodules in either right or left breasts. No palpable axillary supraclavicular or infraclavicular adenopathy no breast tenderness or nipple discharge. (exam performed in the presence of a chaperone)  LABORATORY DATA:  I have reviewed the data as listed CMP Latest Ref Rng & Units 12/12/2018 07/31/2017 12/02/2015  Glucose 65 - 99 mg/dL 112(H) 113(H) 180(H)  BUN 8 - 27 mg/dL 16 20 12   Creatinine 0.57 - 1.00 mg/dL 0.76 0.87 0.71  Sodium 134 - 144 mmol/L 141 139 137  Potassium 3.5 - 5.2 mmol/L 4.2 4.1 4.6  Chloride 96 - 106 mmol/L 101 99 104  CO2 20 - 29 mmol/L 21 23 28   Calcium 8.7 - 10.3 mg/dL 9.8 9.6 9.0  Total Protein 6.0 - 8.5 g/dL - 7.0 -  Total Bilirubin 0.0 - 1.2 mg/dL - 0.4 -  Alkaline Phos 39 - 117 IU/L -  123(H) -  AST 0 - 40 IU/L - 28 -  ALT 0 - 32 IU/L 25 28 -    Lab Results  Component Value Date   WBC 11.9 (H) 12/02/2015   HGB 11.3 (L) 12/02/2015   HCT 34.3 (L) 12/02/2015   MCV 89.8 12/02/2015   PLT 255 12/02/2015   NEUTROABS 3.8 12/07/2006    ASSESSMENT & PLAN:  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 Prognosis: Based on [facility] prognostic nomogram, with antiestrogen therapy and radiation therapy her 5 year risk of recurrence of 1% in 10 year risk is a 2%. Adjuvant radiation completed 10/31/2014 Current treatment: Adjuvant  anastrozole 1 mg daily ( chosen because of history of TIA) since 11/18/2014  Major depression: Much improved withEffexor XR 37.5 mg once daily. This has been helping both depression and hot flashes.  Anastrozole toxicities: 1. Hot flashes:Continues to be a significant problem but she is willing to put up with them. 2. Mood swings:improved with Effexor.  She completed 5 years of antiestrogen therapy.  She will discontinue it at this time.  Survivorship: Patient lost 60 pounds by following a diet.  She is also exercising regularly especially with swimming.  Breast Cancer Surveillance: 1. Breast exam5/13/2021: Benign 2. Mammogram5/01/2020: No evidence of malignancy stable breast density category C  Return to clinic on an as-needed basis.    No orders of the defined types were placed in this encounter.  The patient has a good understanding of the overall plan. she agrees with it. she will call with any problems that may develop before the next visit here.  Total time spent: 20 mins including face to face time and time spent for planning, charting and coordination of care  Nicholas Lose, MD 09/05/2019  I, Cloyde Reams Dorshimer, am acting as scribe for Dr. Nicholas Lose.  I have reviewed the above documentation for accuracy and completeness, and I agree with the above.

## 2019-09-05 ENCOUNTER — Other Ambulatory Visit: Payer: Self-pay

## 2019-09-05 ENCOUNTER — Inpatient Hospital Stay: Payer: Medicare PPO | Attending: Hematology and Oncology | Admitting: Hematology and Oncology

## 2019-09-05 DIAGNOSIS — F951 Chronic motor or vocal tic disorder: Secondary | ICD-10-CM | POA: Insufficient documentation

## 2019-09-05 DIAGNOSIS — C50412 Malignant neoplasm of upper-outer quadrant of left female breast: Secondary | ICD-10-CM | POA: Diagnosis not present

## 2019-09-05 DIAGNOSIS — F329 Major depressive disorder, single episode, unspecified: Secondary | ICD-10-CM | POA: Insufficient documentation

## 2019-09-05 DIAGNOSIS — Z170 Estrogen receptor positive status [ER+]: Secondary | ICD-10-CM | POA: Diagnosis not present

## 2019-09-05 DIAGNOSIS — D0511 Intraductal carcinoma in situ of right breast: Secondary | ICD-10-CM | POA: Diagnosis not present

## 2019-09-05 DIAGNOSIS — F39 Unspecified mood [affective] disorder: Secondary | ICD-10-CM | POA: Insufficient documentation

## 2019-09-05 DIAGNOSIS — Z79811 Long term (current) use of aromatase inhibitors: Secondary | ICD-10-CM | POA: Insufficient documentation

## 2019-09-05 DIAGNOSIS — Z923 Personal history of irradiation: Secondary | ICD-10-CM | POA: Insufficient documentation

## 2019-09-05 NOTE — Assessment & Plan Note (Signed)
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 Prognosis: Based on [facility] prognostic nomogram, with antiestrogen therapy and radiation therapy her 5 year risk of recurrence of 1% in 10 year risk is a 2%. Adjuvant radiation completed 10/31/2014 Current treatment: Adjuvant anastrozole 1 mg daily ( chosen because of history of TIA) since 11/18/2014  Major depression: Much improved withEffexor XR 37.5 mg once daily. This has been helping both depression and hot flashes.  Anastrozole toxicities: 1. Hot flashes:Continues to be a significant problem but she is willing to put up with them. 2. Mood swings:improved with Effexor.   Breast Cancer Surveillance: 1. Breast exam5/13/2021: Benign 2. Mammogram5/01/2020: No evidence of malignancy stable breast density category C  [place]  Return to clinic in 1 yrfor follow-up

## 2019-09-12 DIAGNOSIS — M7542 Impingement syndrome of left shoulder: Secondary | ICD-10-CM | POA: Diagnosis not present

## 2019-09-12 DIAGNOSIS — M25551 Pain in right hip: Secondary | ICD-10-CM | POA: Diagnosis not present

## 2019-09-12 DIAGNOSIS — M7602 Gluteal tendinitis, left hip: Secondary | ICD-10-CM | POA: Diagnosis not present

## 2019-09-12 DIAGNOSIS — M25512 Pain in left shoulder: Secondary | ICD-10-CM | POA: Diagnosis not present

## 2019-09-25 ENCOUNTER — Other Ambulatory Visit: Payer: Self-pay | Admitting: Cardiology

## 2019-09-26 ENCOUNTER — Encounter: Payer: Self-pay | Admitting: Cardiology

## 2019-09-26 ENCOUNTER — Other Ambulatory Visit: Payer: Self-pay

## 2019-09-26 ENCOUNTER — Ambulatory Visit: Payer: Medicare PPO | Admitting: Cardiology

## 2019-09-26 VITALS — BP 124/52 | HR 58 | Ht 63.0 in | Wt 155.8 lb

## 2019-09-26 DIAGNOSIS — I10 Essential (primary) hypertension: Secondary | ICD-10-CM | POA: Diagnosis not present

## 2019-09-26 DIAGNOSIS — E7800 Pure hypercholesterolemia, unspecified: Secondary | ICD-10-CM

## 2019-09-26 MED ORDER — ROSUVASTATIN CALCIUM 10 MG PO TABS: ORAL_TABLET | ORAL | 3 refills | Status: DC

## 2019-09-26 MED ORDER — TELMISARTAN 40 MG PO TABS
40.0000 mg | ORAL_TABLET | Freq: Every day | ORAL | 3 refills | Status: DC
Start: 2019-09-26 — End: 2020-11-18

## 2019-09-26 MED ORDER — EZETIMIBE 10 MG PO TABS: 10.0000 mg | ORAL_TABLET | Freq: Every day | ORAL | 3 refills | Status: DC

## 2019-09-26 MED ORDER — ASPIRIN EC 81 MG PO TBEC: 81.0000 mg | DELAYED_RELEASE_TABLET | Freq: Every day | ORAL | 3 refills | Status: AC

## 2019-09-26 MED ORDER — CO-ENZYME Q10 200 MG PO CAPS: 300.0000 mg | ORAL_CAPSULE | Freq: Every day | ORAL | 3 refills | Status: DC

## 2019-09-26 NOTE — Addendum Note (Signed)
Addended by: Antonieta Iba on: 09/26/2019 12:20 PM   Modules accepted: Orders

## 2019-09-26 NOTE — Addendum Note (Signed)
Addended by: Antonieta Iba on: 09/26/2019 12:25 PM   Modules accepted: Orders

## 2019-09-26 NOTE — Patient Instructions (Signed)
Please take your blood pressure daily for one week and send [place] a message with your readings.   Medication Instructions:  Your physician has recommended you make the following change in your medication:  1) STOP taking Mycardis-HCTZ 2) START taking Mycartis 40 mg daily  *If you need a refill on your cardiac medications before your next appointment, please call your pharmacy*  Follow-Up: At [facility], you and your health needs are our priority.  As part of our continuing mission to provide you with exceptional heart care, we have created designated Provider Care Teams.  These Care Teams include your primary Cardiologist (physician) and Advanced Practice Providers (APPs -  Physician Assistants and Nurse Practitioners) who all work together to provide you with the care you need, when you need it.  We recommend signing up for the patient portal called "MyChart".  Sign up information is provided on this After Visit Summary.  MyChart is used to connect with patients for Virtual Visits (Telemedicine).  Patients are able to view lab/test results, encounter notes, upcoming appointments, etc.  Non-urgent messages can be sent to your provider as well.   To learn more about what you can do with MyChart, go to [email or web address].    Your next appointment:   1 year(s)  The format for your next appointment:   In Person  Provider:   You may see Fransico Him, MD or one of the following Advanced Practice Providers on your designated Care Team:    Melina Copa, PA-C  Ermalinda Barrios, PA-C

## 2019-09-26 NOTE — Progress Notes (Signed)
Date:  09/26/2019   ID:  Tammy Cooper, DOB 01/11/1951, MRN [id number]   PCP:  Donald Prose, MD  Cardiologist:  Fransico Him, MD Electrophysiologist:  None   Chief Complaint:  Hypertension and Hyperlipidemia  History of Present Illness:    Tammy Cooper is a 69 y.o. female with a hx of HTN and dyslipidemia.  She is here today for followup and is doing well.  She has lost over 60lbs with [place] and is feeling great!  She denies any chest pain or pressure, SOB, DOE, PND, orthopnea, LE edema, dizziness, palpitations or syncope. She is compliant with her meds and is tolerating meds with no SE.    Prior CV studies:   The following studies were reviewed today:  None  Past Medical History:  Diagnosis Date  . Anxiety   . Arthritis   . Breast cancer ([place]) 05/28/14   Left breast in situ   . Complication of anesthesia   . Constipation    occasiopnal  . History of breast cancer 2016  . History of recurrent cystitis   . Hypercholesteremia   . Hypertension   . PONV (postoperative nausea and vomiting)    "a little nausea"  . Spinal stenosis   . Stroke [facility])    H/O TIA in 2006, MRA clear,arteriogram was done which was normal   Past Surgical History:  Procedure Laterality Date  . anal skin tag removal  10/21/10  . BACK SURGERY  07/12/2010   stenosis L4 + L5  . BLADDER TACK     WITH THE HYSTERECTOMY  . BREAST BIOPSY Left 06/13/2014   Benign  . BREAST BIOPSY Left 05/28/2014   High Risk  . BREAST LUMPECTOMY Left 07/31/2014  . BREAST LUMPECTOMY WITH RADIOACTIVE SEED LOCALIZATION Left 07/31/2014   Procedure: LEFT BREAST LUMPECTOMY WITH RADIOACTIVE SEED LOCALIZATION;  Surgeon: Coralie Keens, MD;  Location: [place];  Service: General;  Laterality: Left;  . COLONOSCOPY    . HEMORROIDECTOMY    . KNEE ARTHROSCOPY  2010   left  . ROTATOR CUFF REPAIR  2005   rt shoulder  . TONSILLECTOMY    . TOTAL KNEE ARTHROPLASTY Right 08/04/2015   Procedure: RIGHT TOTAL KNEE ARTHROPLASTY;  Surgeon: Paralee Cancel, MD;  Location: [facility];  Service: Orthopedics;  Laterality: Right;  . TOTAL KNEE ARTHROPLASTY Left 12/01/2015   Procedure: LEFT TOTAL KNEE ARTHROPLASTY;  Surgeon: Paralee Cancel, MD;  Location: [facility];  Service: Orthopedics;  Laterality: Left;  [name] VAGINAL HYSTERECTOMY  2003   total hyst-BSO-Ant/post repair     Current Meds  Medication Sig  . aspirin EC 81 MG tablet Take 81 mg by mouth daily.  [name] Co-Enzyme Q10 200 MG CAPS Take 300 mg by mouth daily.   [name] ezetimibe (ZETIA) 10 MG tablet Take 1 tablet (10 mg total) by mouth daily.  . Multiple Vitamin (MULTIVITAMIN WITH MINERALS) TABS tablet Take 1 tablet by mouth daily.  . rosuvastatin (CRESTOR) 10 MG tablet Take 1 tablet (10 mg total) by mouth daily.  [name] telmisartan-hydrochlorothiazide (MICARDIS HCT) 40-12.5 MG tablet Take 1 tablet by mouth daily.     Allergies:   Lipitor [atorvastatin] and Adhesive [tape]   Social History   Tobacco Use  . Smoking status: Never Smoker  . Smokeless tobacco: Never Used  Substance Use Topics  . Alcohol use: Yes    Comment: ocassionally  . Drug use: No     Family Hx: The patient's family history includes CAD in her  brother; Cancer in her father; Hypertension in her mother and sister. There is no history of Heart attack.  ROS:   Please see the history of present illness.     All other systems reviewed and are negative.   Labs/Other Tests and Data Reviewed:    Recent Labs: 12/12/2018: ALT 25; BUN 16; Creatinine, Ser 0.76; Potassium 4.2; Sodium 141   Recent Lipid Panel Lab Results  Component Value Date/Time   CHOL 144 12/12/2018 07:55 AM   CHOL 140 10/23/2015 07:34 AM   CHOL 160 09/02/2014 07:30 AM   TRIG 126 12/12/2018 07:55 AM   TRIG 139 10/23/2015 07:34 AM   TRIG 206 (H) 09/02/2014 07:30 AM   HDL 49 12/12/2018 07:55 AM   HDL 53 10/23/2015 07:34 AM   HDL 55 09/02/2014 07:30 AM   CHOLHDL 2.9 12/12/2018 07:55 AM   CHOLHDL 2.6  10/23/2015 07:34 AM   CHOLHDL 3.1 CALC 05/29/2006 10:13 AM   LDLCALC 70 12/12/2018 07:55 AM   LDLCALC 59 10/23/2015 07:34 AM   LDLCALC 64 09/02/2014 07:30 AM    Wt Readings from Last 3 Encounters:  09/26/19 155 lb 12.8 oz (70.7 kg)  09/05/19 161 lb 3.2 oz (73.1 kg)  09/06/18 217 lb (98.4 kg)     Objective:    Vital Signs:  BP (!) 124/52   Pulse (!) 58   Ht 5\' 3"  (1.6 m)   Wt 155 lb 12.8 oz (70.7 kg)   SpO2 99%   BMI 27.60 kg/m    GEN: Well nourished, well developed in no acute distress HEENT: Normal NECK: No JVD; No carotid bruits LYMPHATICS: No lymphadenopathy CARDIAC:RRR, no murmurs, rubs, gallops RESPIRATORY:  Clear to auscultation without rales, wheezing or rhonchi  ABDOMEN: Soft, non-tender, non-distended MUSCULOSKELETAL:  No edema; No deformity  SKIN: Warm and dry NEUROLOGIC:  Alert and oriented x 3 PSYCHIATRIC:  Normal affect    ASSESSMENT & PLAN:    1.  Hypertension  -BP controlled on exam -she has had some issues with dizziness on occasion likely related to weight loss and being on BP meds -I will stop the diuretic and continue on Micardis 40mg  daily -I have asked her to check her BP daily for a week and call with results  2.  Hyperlipidemia  - Her LDL goal is < 70.   -LDL was 61 on 5/5/202  -She will continue on Zetia 10mg  daily and Crestor 10mg  daily.    3.  Obesity -she started the [place] back in August and has lost over 60lb   Medication Adjustments/Labs and Tests Ordered: Current medicines are reviewed at length with the patient today.  Concerns regarding medicines are outlined above.  Tests Ordered: No orders of the defined types were placed in this encounter.  Medication Changes: No orders of the defined types were placed in this encounter.   Disposition:  Follow up in 1 year(s)  Signed, Fransico Him, MD  09/26/2019 12:09 PM    [place]

## 2019-10-03 DIAGNOSIS — L239 Allergic contact dermatitis, unspecified cause: Secondary | ICD-10-CM | POA: Diagnosis not present

## 2019-10-09 DIAGNOSIS — N390 Urinary tract infection, site not specified: Secondary | ICD-10-CM | POA: Diagnosis not present

## 2019-10-10 DIAGNOSIS — D485 Neoplasm of uncertain behavior of skin: Secondary | ICD-10-CM | POA: Diagnosis not present

## 2019-10-10 DIAGNOSIS — L570 Actinic keratosis: Secondary | ICD-10-CM | POA: Diagnosis not present

## 2019-11-30 DIAGNOSIS — R350 Frequency of micturition: Secondary | ICD-10-CM | POA: Diagnosis not present

## 2019-11-30 DIAGNOSIS — R300 Dysuria: Secondary | ICD-10-CM | POA: Diagnosis not present

## 2020-01-03 DIAGNOSIS — R319 Hematuria, unspecified: Secondary | ICD-10-CM | POA: Diagnosis not present

## 2020-01-03 DIAGNOSIS — I10 Essential (primary) hypertension: Secondary | ICD-10-CM | POA: Diagnosis not present

## 2020-01-03 DIAGNOSIS — R300 Dysuria: Secondary | ICD-10-CM | POA: Diagnosis not present

## 2020-01-27 DIAGNOSIS — R310 Gross hematuria: Secondary | ICD-10-CM | POA: Diagnosis not present

## 2020-02-19 DIAGNOSIS — D0512 Intraductal carcinoma in situ of left breast: Secondary | ICD-10-CM | POA: Diagnosis not present

## 2020-02-19 DIAGNOSIS — I700 Atherosclerosis of aorta: Secondary | ICD-10-CM | POA: Diagnosis not present

## 2020-02-19 DIAGNOSIS — S39012A Strain of muscle, fascia and tendon of lower back, initial encounter: Secondary | ICD-10-CM | POA: Diagnosis not present

## 2020-02-19 DIAGNOSIS — Z23 Encounter for immunization: Secondary | ICD-10-CM | POA: Diagnosis not present

## 2020-02-19 DIAGNOSIS — Z8673 Personal history of transient ischemic attack (TIA), and cerebral infarction without residual deficits: Secondary | ICD-10-CM | POA: Diagnosis not present

## 2020-02-19 DIAGNOSIS — I10 Essential (primary) hypertension: Secondary | ICD-10-CM | POA: Diagnosis not present

## 2020-02-19 DIAGNOSIS — Z0000 Encounter for general adult medical examination without abnormal findings: Secondary | ICD-10-CM | POA: Diagnosis not present

## 2020-02-19 DIAGNOSIS — E7800 Pure hypercholesterolemia, unspecified: Secondary | ICD-10-CM | POA: Diagnosis not present

## 2020-02-19 DIAGNOSIS — Z1389 Encounter for screening for other disorder: Secondary | ICD-10-CM | POA: Diagnosis not present

## 2020-05-05 DIAGNOSIS — R059 Cough, unspecified: Secondary | ICD-10-CM | POA: Diagnosis not present

## 2020-05-05 DIAGNOSIS — J069 Acute upper respiratory infection, unspecified: Secondary | ICD-10-CM | POA: Diagnosis not present

## 2020-06-10 DIAGNOSIS — L718 Other rosacea: Secondary | ICD-10-CM | POA: Diagnosis not present

## 2020-06-10 DIAGNOSIS — L814 Other melanin hyperpigmentation: Secondary | ICD-10-CM | POA: Diagnosis not present

## 2020-06-10 DIAGNOSIS — L309 Dermatitis, unspecified: Secondary | ICD-10-CM | POA: Diagnosis not present

## 2020-06-10 DIAGNOSIS — L820 Inflamed seborrheic keratosis: Secondary | ICD-10-CM | POA: Diagnosis not present

## 2020-06-10 DIAGNOSIS — L304 Erythema intertrigo: Secondary | ICD-10-CM | POA: Diagnosis not present

## 2020-06-10 DIAGNOSIS — L821 Other seborrheic keratosis: Secondary | ICD-10-CM | POA: Diagnosis not present

## 2020-06-23 DIAGNOSIS — K635 Polyp of colon: Secondary | ICD-10-CM | POA: Diagnosis not present

## 2020-06-23 DIAGNOSIS — L309 Dermatitis, unspecified: Secondary | ICD-10-CM | POA: Diagnosis not present

## 2020-06-23 DIAGNOSIS — R310 Gross hematuria: Secondary | ICD-10-CM | POA: Diagnosis not present

## 2020-09-11 ENCOUNTER — Other Ambulatory Visit: Payer: Self-pay | Admitting: Hematology and Oncology

## 2020-09-11 DIAGNOSIS — Z1231 Encounter for screening mammogram for malignant neoplasm of breast: Secondary | ICD-10-CM

## 2020-09-23 DIAGNOSIS — Z8673 Personal history of transient ischemic attack (TIA), and cerebral infarction without residual deficits: Secondary | ICD-10-CM

## 2020-09-23 HISTORY — DX: Personal history of transient ischemic attack (TIA), and cerebral infarction without residual deficits: Z86.73

## 2020-09-29 DIAGNOSIS — M549 Dorsalgia, unspecified: Secondary | ICD-10-CM | POA: Diagnosis not present

## 2020-09-29 DIAGNOSIS — E6609 Other obesity due to excess calories: Secondary | ICD-10-CM | POA: Diagnosis not present

## 2020-09-29 DIAGNOSIS — I10 Essential (primary) hypertension: Secondary | ICD-10-CM | POA: Diagnosis not present

## 2020-10-16 ENCOUNTER — Telehealth: Payer: Self-pay | Admitting: Cardiology

## 2020-10-16 DIAGNOSIS — H25813 Combined forms of age-related cataract, bilateral: Secondary | ICD-10-CM | POA: Diagnosis not present

## 2020-10-16 DIAGNOSIS — H34231 Retinal artery branch occlusion, right eye: Secondary | ICD-10-CM | POA: Diagnosis not present

## 2020-10-16 DIAGNOSIS — H47019 Ischemic optic neuropathy, unspecified eye: Secondary | ICD-10-CM

## 2020-10-16 NOTE — Telephone Encounter (Signed)
Spoke with the patient who reports that she was having blurry vision in her right eye and went to the eye doctor today. 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 She states that she saw Dr. Sandrea Matte who recommended that she follow up with cardiology ASAP for carotid US. Patient states that Dr. Sammuel Bailiff is supposed to be calling us and faxing over office visit notes.

## 2020-10-16 NOTE — Telephone Encounter (Signed)
Left message for patient to call back  

## 2020-10-16 NOTE — Telephone Encounter (Signed)
Urgent appt is needed. 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 Was told by her eye dr she needs to be seen asap. Please advise

## 2020-10-19 ENCOUNTER — Other Ambulatory Visit: Payer: Self-pay | Admitting: *Deleted

## 2020-10-19 NOTE — Addendum Note (Signed)
Addended by: Antonieta Iba on: 10/19/2020 08:49 AM   Modules accepted: Orders

## 2020-10-19 NOTE — Telephone Encounter (Signed)
Spoke with the patient and advised her on testing ordered by Dr. Radford Pax. Orders have been placed. Patient verbalized understanding.

## 2020-10-20 ENCOUNTER — Ambulatory Visit (HOSPITAL_COMMUNITY)
Admission: RE | Admit: 2020-10-20 | Discharge: 2020-10-20 | Disposition: A | Discharge status: Home / Self Care | Payer: Medicare PPO | Source: Ambulatory Visit | Attending: Internal Medicine | Admitting: Internal Medicine

## 2020-10-20 ENCOUNTER — Ambulatory Visit (HOSPITAL_BASED_OUTPATIENT_CLINIC_OR_DEPARTMENT_OTHER): Payer: Medicare PPO

## 2020-10-20 ENCOUNTER — Other Ambulatory Visit: Payer: Self-pay

## 2020-10-20 DIAGNOSIS — H47011 Ischemic optic neuropathy, right eye: Secondary | ICD-10-CM

## 2020-10-20 DIAGNOSIS — H47019 Ischemic optic neuropathy, unspecified eye: Secondary | ICD-10-CM

## 2020-10-20 DIAGNOSIS — I10 Essential (primary) hypertension: Secondary | ICD-10-CM | POA: Diagnosis not present

## 2020-10-20 DIAGNOSIS — E785 Hyperlipidemia, unspecified: Secondary | ICD-10-CM | POA: Insufficient documentation

## 2020-10-20 LAB — ECHOCARDIOGRAM COMPLETE
Area-P 1/2: 3.58 cm2
S' Lateral: 2.8 cm

## 2020-10-21 ENCOUNTER — Encounter: Payer: Self-pay | Admitting: Cardiology

## 2020-10-21 ENCOUNTER — Telehealth: Payer: Self-pay

## 2020-10-21 DIAGNOSIS — I6521 Occlusion and stenosis of right carotid artery: Secondary | ICD-10-CM | POA: Insufficient documentation

## 2020-10-21 DIAGNOSIS — E7800 Pure hypercholesterolemia, unspecified: Secondary | ICD-10-CM

## 2020-10-21 NOTE — Telephone Encounter (Signed)
-----   Message from Sueanne Margarita, MD sent at 10/21/2020  2:23 PM EDT ----- Carotid dopplers showed 1-39% right ICA stenosis and left subclavian stenosis - please find out if she has any left arm cramping or numbness when using it

## 2020-10-21 NOTE — Telephone Encounter (Signed)
Spoke to the patient in regards to MyChart message over the phone/.

## 2020-10-21 NOTE — Telephone Encounter (Signed)
The patient has been notified of the result and verbalized understanding.  All questions (if any) were answered. Antonieta Iba, RN 10/21/2020 5:03 PM  Patient will come by lab work on 7/01. She denies pain/numbness in her left arm.

## 2020-10-23 ENCOUNTER — Ambulatory Visit (INDEPENDENT_AMBULATORY_CARE_PROVIDER_SITE_OTHER): Payer: Medicare PPO

## 2020-10-23 ENCOUNTER — Other Ambulatory Visit: Payer: Self-pay

## 2020-10-23 ENCOUNTER — Other Ambulatory Visit: Payer: Medicare PPO | Admitting: *Deleted

## 2020-10-23 DIAGNOSIS — H47019 Ischemic optic neuropathy, unspecified eye: Secondary | ICD-10-CM | POA: Diagnosis not present

## 2020-10-23 DIAGNOSIS — E7800 Pure hypercholesterolemia, unspecified: Secondary | ICD-10-CM

## 2020-10-23 DIAGNOSIS — I480 Paroxysmal atrial fibrillation: Secondary | ICD-10-CM

## 2020-10-23 LAB — LIPID PANEL
Chol/HDL Ratio: 1.9 ratio (ref 0.0–4.4)
Cholesterol, Total: 133 mg/dL (ref 100–199)
HDL: 69 mg/dL (ref >39)
LDL Chol Calc (NIH): 54 mg/dL (ref 0–99)
Triglycerides: 38 mg/dL (ref 0–149)
VLDL Cholesterol Cal: 10 mg/dL (ref 5–40)

## 2020-10-23 LAB — ALT: ALT: 20 IU/L (ref 0–32)

## 2020-11-01 ENCOUNTER — Other Ambulatory Visit: Payer: Self-pay | Admitting: Cardiology

## 2020-11-09 ENCOUNTER — Other Ambulatory Visit: Payer: Self-pay

## 2020-11-09 ENCOUNTER — Ambulatory Visit
Admission: RE | Admit: 2020-11-09 | Discharge: 2020-11-09 | Disposition: A | Discharge status: Home / Self Care | Payer: Medicare PPO | Source: Ambulatory Visit | Attending: Hematology and Oncology | Admitting: Hematology and Oncology

## 2020-11-09 DIAGNOSIS — Z1231 Encounter for screening mammogram for malignant neoplasm of breast: Secondary | ICD-10-CM

## 2020-11-18 ENCOUNTER — Other Ambulatory Visit: Payer: Self-pay | Admitting: Cardiology

## 2020-11-24 ENCOUNTER — Other Ambulatory Visit: Payer: Self-pay | Admitting: Cardiology

## 2020-11-24 DIAGNOSIS — I480 Paroxysmal atrial fibrillation: Secondary | ICD-10-CM

## 2020-11-24 DIAGNOSIS — H47019 Ischemic optic neuropathy, unspecified eye: Secondary | ICD-10-CM

## 2020-11-27 ENCOUNTER — Telehealth: Payer: Self-pay

## 2020-11-27 DIAGNOSIS — H47019 Ischemic optic neuropathy, unspecified eye: Secondary | ICD-10-CM

## 2020-11-27 NOTE — Telephone Encounter (Signed)
-----   Message from Sueanne Margarita, MD sent at 11/26/2020  4:38 PM EDT ----- Please  call her ophthalmologist who dx the ocular CVA to find out if they think it was cardioembolic and if yes then refer to EP for ILR

## 2020-11-27 NOTE — Telephone Encounter (Signed)
Spoke with triage nurse at Dr. Latanya Maudlin office. She states that per note that Dr. Stacie Glaze did not mention that stroke was cardioembolic but it was suspected so wanted cardiac workup.

## 2020-11-30 NOTE — Telephone Encounter (Signed)
Referral has been placed for patient to see EP. Patient is aware.

## 2020-12-08 ENCOUNTER — Other Ambulatory Visit: Payer: Self-pay

## 2020-12-08 ENCOUNTER — Ambulatory Visit: Payer: Medicare PPO | Admitting: Cardiology

## 2020-12-08 ENCOUNTER — Encounter: Payer: Self-pay | Admitting: Cardiology

## 2020-12-08 VITALS — BP 126/70 | HR 57 | Ht 63.0 in | Wt 193.2 lb

## 2020-12-08 DIAGNOSIS — E7800 Pure hypercholesterolemia, unspecified: Secondary | ICD-10-CM

## 2020-12-08 DIAGNOSIS — I6521 Occlusion and stenosis of right carotid artery: Secondary | ICD-10-CM

## 2020-12-08 DIAGNOSIS — H47019 Ischemic optic neuropathy, unspecified eye: Secondary | ICD-10-CM

## 2020-12-08 DIAGNOSIS — I10 Essential (primary) hypertension: Secondary | ICD-10-CM

## 2020-12-08 MED ORDER — ROSUVASTATIN CALCIUM 10 MG PO TABS: 10.0000 mg | ORAL_TABLET | Freq: Every day | ORAL | 3 refills | Status: DC

## 2020-12-08 MED ORDER — EZETIMIBE 10 MG PO TABS: 10.0000 mg | ORAL_TABLET | Freq: Every day | ORAL | 3 refills | Status: DC

## 2020-12-08 MED ORDER — TELMISARTAN 40 MG PO TABS: 40.0000 mg | ORAL_TABLET | Freq: Every day | ORAL | 3 refills | Status: DC

## 2020-12-08 NOTE — Patient Instructions (Signed)
Medication Instructions:  Your physician recommends that you continue on your current medications as directed. Please refer to the Current Medication list given to you today.  *If you need a refill on your cardiac medications before your next appointment, please call your pharmacy*   Lab Work: TODAY: BMET If you have labs (blood work) drawn today and your tests are completely normal, you will receive your results only by: [place] (if you have MyChart) OR A paper copy in the mail If you have any lab test that is abnormal or we need to change your treatment, we will call you to review the results.   Follow-Up: At [facility], you and your health needs are our priority.  As part of our continuing mission to provide you with exceptional heart care, we have created designated Provider Care Teams.  These Care Teams include your primary Cardiologist (physician) and Advanced Practice Providers (APPs -  Physician Assistants and Nurse Practitioners) who all work together to provide you with the care you need, when you need it.   Your next appointment:   1 year(s)  The format for your next appointment:   In Person  Provider:   You may see Fransico Him, MD or one of the following Advanced Practice Providers on your designated Care Team:   Melina Copa, PA-C Ermalinda Barrios, PA-C

## 2020-12-08 NOTE — Addendum Note (Signed)
Addended by: Antonieta Iba on: 12/08/2020 11:34 AM   Modules accepted: Orders

## 2020-12-08 NOTE — Progress Notes (Signed)
Date:  12/08/2020   ID:  Tammy Cooper, DOB 02-Mar-1951, MRN [id number]   PCP:  Donald Prose, MD  Cardiologist:  Fransico Him, MD Electrophysiologist:  None   Chief Complaint:  Hypertension and Hyperlipidemia  History of Present Illness:    Tammy Cooper is a 70 y.o. female with a hx of HTN, carotid stenosis and dyslipidemia.  She is here today for followup and is doing well.  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  2D echo was normal, carotid dopplers showed 1-39% RICA stenosis and event monitor was normal.   She denies any chest pain or pressure, SOB, DOE, PND, orthopnea, LE edema, dizziness, palpitations or syncope.  She is compliant with her meds and is tolerating meds with no SE.     Prior CV studies:   The following studies were reviewed today:  EKG, 2D echo, event monitor, carotid dopplers  Past Medical History:  Diagnosis Date   Anxiety    Arthritis    Breast cancer ([place]) 05/28/2014   Left breast in situ    Carotid stenosis, asymptomatic, right    1-39% right ICA stenosis and left subclavian stenosis   Complication of anesthesia    Constipation    occasiopnal   History of breast cancer 04/25/2014   History of recurrent cystitis    Hypercholesteremia    Hypertension    PONV (postoperative nausea and vomiting)    "a little nausea"   Spinal stenosis    Stroke [facility])    H/O TIA in 2006, MRA clear,arteriogram was done which was normal   Past Surgical History:  Procedure Laterality Date   anal skin tag removal  10/21/10   BACK SURGERY  07/12/2010   stenosis L4 + L5   BLADDER TACK     WITH THE HYSTERECTOMY   BREAST BIOPSY Left 06/13/2014   Benign   BREAST BIOPSY Left 05/28/2014   High Risk   BREAST LUMPECTOMY Left 07/31/2014   BREAST LUMPECTOMY WITH RADIOACTIVE SEED LOCALIZATION Left 07/31/2014   Procedure: LEFT BREAST LUMPECTOMY WITH RADIOACTIVE SEED LOCALIZATION;  Surgeon: Coralie Keens, MD;   Location: [place];  Service: General;  Laterality: Left;   COLONOSCOPY     HEMORROIDECTOMY     KNEE ARTHROSCOPY  2010   left   ROTATOR CUFF REPAIR  2005   rt shoulder   TONSILLECTOMY     TOTAL KNEE ARTHROPLASTY Right 08/04/2015   Procedure: RIGHT TOTAL KNEE ARTHROPLASTY;  Surgeon: Paralee Cancel, MD;  [facility];  Service: Orthopedics;  Laterality: Right;   TOTAL KNEE ARTHROPLASTY Left 12/01/2015   Procedure: LEFT TOTAL KNEE ARTHROPLASTY;  Surgeon: Paralee Cancel, MD;  [facility];  Service: Orthopedics;  Laterality: Left;   VAGINAL HYSTERECTOMY  2003   total hyst-BSO-Ant/post repair     Current Meds  Medication Sig   aspirin EC 81 MG tablet Take 1 tablet (81 mg total) by mouth daily.   Co-Enzyme Q10 200 MG CAPS Take 300 mg by mouth daily.   ezetimibe (ZETIA) 10 MG tablet TAKE 1 TABLET ONCE DAILY.   Multiple Vitamin (MULTIVITAMIN WITH MINERALS) TABS tablet Take 1 tablet by mouth daily.   rosuvastatin (CRESTOR) 10 MG tablet TAKE 1 TABLET EACH DAY.   telmisartan (MICARDIS) 40 MG tablet TAKE 1 TABLET ONCE DAILY.     Allergies:   Lipitor [atorvastatin] and Adhesive [tape]   Social History   Tobacco Use   Smoking status: Never  Smokeless tobacco: Never  Vaping Use   Vaping Use: Never used  Substance Use Topics   Alcohol use: Yes    Comment: ocassionally   Drug use: No     Family Hx: The patient's family history includes CAD in her brother; Cancer in her father; Hypertension in her mother and sister. There is no history of Heart attack.  ROS:   Please see the history of present illness.     All other systems reviewed and are negative.   Labs/Other Tests and Data Reviewed:    Recent Labs: 10/23/2020: ALT 20   Recent Lipid Panel Lab Results  Component Value Date/Time   CHOL 133 10/23/2020 07:44 AM   CHOL 140 10/23/2015 07:34 AM   CHOL 160 09/02/2014 07:30 AM   TRIG 38 10/23/2020 07:44 AM   TRIG 139 10/23/2015 07:34 AM   TRIG 206 (H)  09/02/2014 07:30 AM   HDL 69 10/23/2020 07:44 AM   HDL 53 10/23/2015 07:34 AM   HDL 55 09/02/2014 07:30 AM   CHOLHDL 1.9 10/23/2020 07:44 AM   CHOLHDL 2.6 10/23/2015 07:34 AM   CHOLHDL 3.1 CALC 05/29/2006 10:13 AM   LDLCALC 54 10/23/2020 07:44 AM   LDLCALC 59 10/23/2015 07:34 AM   LDLCALC 64 09/02/2014 07:30 AM    Wt Readings from Last 3 Encounters:  12/08/20 193 lb 3.2 oz (87.6 kg)  09/26/19 155 lb 12.8 oz (70.7 kg)  09/05/19 161 lb 3.2 oz (73.1 kg)     Objective:    Vital Signs:  BP 126/70   Pulse (!) 57   Ht '5\' 3"'$  (1.6 m)   Wt 193 lb 3.2 oz (87.6 kg)   SpO2 94%   BMI 34.22 kg/m    GEN: Well nourished, well developed in no acute distress HEENT: Normal NECK: No JVD; No carotid bruits LYMPHATICS: No lymphadenopathy CARDIAC:RRR, no murmurs, rubs, gallops RESPIRATORY:  Clear to auscultation without rales, wheezing or rhonchi  ABDOMEN: Soft, non-tender, non-distended MUSCULOSKELETAL:  No edema; No deformity  SKIN: Warm and dry NEUROLOGIC:  Alert and oriented x 3 PSYCHIATRIC:  Normal affect    EKG was performed in the office today and showed sinus bradycardia with no ST changes ASSESSMENT & PLAN:    1.  Hypertension  -Bp is adequately controlled on exam today -Continue prescription drug management with Micardis '40mg'$  daily>>refilled today -check BMET today  2.  Hyperlipidemia  -Her LDL goal is < 70.   -I have personally reviewed and interpreted outside LDL 54, HDL 69, ALT 20 in July 2022  -Continue prescription drug management with Zetia '10mg'$  daily and Crestor '10mg'$  daily >>refilled  3.  Obesity -she started the [place] back in August and has lost over 60lb bt has gained 30lbs back  4.  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  Concerns regarding medicines are outlined above.  Tests Ordered: Orders Placed This Encounter  Procedures   EKG 12-Lead    Medication Changes: No orders of the defined types were placed in this encounter.   Disposition:  Follow up in 1 year(s)  Signed, Fransico Him, MD  12/08/2020 11:27 AM    [facility]

## 2020-12-09 LAB — BASIC METABOLIC PANEL
BUN/Creatinine Ratio: 23 (ref 12–28)
BUN: 15 mg/dL (ref 8–27)
CO2: 21 mmol/L (ref 20–29)
Calcium: 9.6 mg/dL (ref 8.7–10.3)
Chloride: 102 mmol/L (ref 96–106)
Creatinine, Ser: 0.65 mg/dL (ref 0.57–1.00)
Glucose: 93 mg/dL (ref 65–99)
Potassium: 4.5 mmol/L (ref 3.5–5.2)
Sodium: 142 mmol/L (ref 134–144)
eGFR: 93 mL/min/{1.73_m2} (ref >59)

## 2020-12-22 DIAGNOSIS — H34231 Retinal artery branch occlusion, right eye: Secondary | ICD-10-CM | POA: Diagnosis not present

## 2021-01-11 NOTE — Progress Notes (Signed)
Electrophysiology Office Note:    Date:  01/12/2021   ID:  Tammy Cooper, DOB 13-Jan-1951, MRN [id number]  PCP:  Donald Prose, MD  [facility] Cardiologist:  Fransico Him, MD  [facility] Electrophysiologist:  Vickie Epley, MD   Referring MD: Sueanne Margarita, MD   Chief Complaint: Cryptogenic stroke  History of Present Illness:    Tammy Cooper is a 70 y.o. female who presents for an evaluation of cryptogenic stroke at the request of Dr. Radford Pax. Their medical history includes cryptogenic optic stroke, hypertension, hypercholesterolemia, spinal stenosis.  The patient was last seen by Dr. Radford Pax on December 08, 2020.  At that appointment the patient reported that her ophthalmologist felt like her ocular stroke in the past was cardioembolic.  The patient never been diagnosed with atrial fibrillation.  Past Medical History:  Diagnosis Date   Anxiety    Arthritis    Breast cancer ([place]) 05/28/2014   Left breast in situ    Carotid stenosis, asymptomatic, right    1-39% right ICA stenosis and left subclavian stenosis   Complication of anesthesia    Constipation    occasiopnal   History of breast cancer 04/25/2014   History of recurrent cystitis    Hypercholesteremia    Hypertension    PONV (postoperative nausea and vomiting)    "a little nausea"   Spinal stenosis    Stroke [facility])    H/O TIA in 2006, MRA clear,arteriogram was done which was normal    Past Surgical History:  Procedure Laterality Date   anal skin tag removal  10/21/10   BACK SURGERY  07/12/2010   stenosis L4 + L5   BLADDER TACK     WITH THE HYSTERECTOMY   BREAST BIOPSY Left 06/13/2014   Benign   BREAST BIOPSY Left 05/28/2014   High Risk   BREAST LUMPECTOMY Left 07/31/2014   BREAST LUMPECTOMY WITH RADIOACTIVE SEED LOCALIZATION Left 07/31/2014   Procedure: LEFT BREAST LUMPECTOMY WITH RADIOACTIVE SEED LOCALIZATION;  Surgeon: Coralie Keens, MD;  Location: [place];   Service: General;  Laterality: Left;   COLONOSCOPY     HEMORROIDECTOMY     KNEE ARTHROSCOPY  2010   left   ROTATOR CUFF REPAIR  2005   rt shoulder   TONSILLECTOMY     TOTAL KNEE ARTHROPLASTY Right 08/04/2015   Procedure: RIGHT TOTAL KNEE ARTHROPLASTY;  Surgeon: Paralee Cancel, MD;  Location: [facility];  Service: Orthopedics;  Laterality: Right;   TOTAL KNEE ARTHROPLASTY Left 12/01/2015   Procedure: LEFT TOTAL KNEE ARTHROPLASTY;  Surgeon: Paralee Cancel, MD;  Location: [facility];  Service: Orthopedics;  Laterality: Left;   VAGINAL HYSTERECTOMY  2003   total hyst-BSO-Ant/post repair    Current Medications: Current Meds  Medication Sig   aspirin EC 81 MG tablet Take 1 tablet (81 mg total) by mouth daily.   Co-Enzyme Q10 200 MG CAPS Take 300 mg by mouth daily.   ezetimibe (ZETIA) 10 MG tablet Take 1 tablet (10 mg total) by mouth daily.   Multiple Vitamin (MULTIVITAMIN WITH MINERALS) TABS tablet Take 1 tablet by mouth daily.   rosuvastatin (CRESTOR) 10 MG tablet Take 1 tablet (10 mg total) by mouth daily.   telmisartan (MICARDIS) 40 MG tablet Take 1 tablet (40 mg total) by mouth daily.     Allergies:   Lipitor [atorvastatin] and Adhesive [tape]   Social History   Socioeconomic History   Marital status: Married    Spouse name: Not on file  Number of children: Not on file   Years of education: Not on file   Highest education level: Not on file  Occupational History   Not on file  Tobacco Use   Smoking status: Never   Smokeless tobacco: Never  Vaping Use   Vaping Use: Never used  Substance and Sexual Activity   Alcohol use: Yes    Comment: ocassionally   Drug use: No   Sexual activity: Yes  Other Topics Concern   Not on file  Social History Narrative   Not on file   Social Determinants of Health   Financial Resource Strain: Not on file  Food Insecurity: Not on file  Transportation Needs: Not on file  Physical Activity: Not on file  Stress: Not on file  Social Connections: Not  on file     Family History: The patient's family history includes CAD in her brother; Cancer in her father; Hypertension in her mother and sister. There is no history of Heart attack.  ROS:   Please see the history of present illness.    All other systems reviewed and are negative.  EKGs/Labs/Other Studies Reviewed:    The following studies were reviewed today:  October 20, 2020 echo Left ventricular function normal, 65% Right ventricular function normal Mildly dilated left atrium No significant valvular abnormalities  November 26, 2020 preventives monitor No arrhythmias, no evidence of atrial fibrillation     Recent Labs: 10/23/2020: ALT 20 12/08/2020: BUN 15; Creatinine, Ser 0.65; Potassium 4.5; Sodium 142  Recent Lipid Panel    Component Value Date/Time   CHOL 133 10/23/2020 0744   CHOL 140 10/23/2015 0734   CHOL 160 09/02/2014 0730   TRIG 38 10/23/2020 0744   TRIG 139 10/23/2015 0734   TRIG 206 (H) 09/02/2014 0730   HDL 69 10/23/2020 0744   HDL 53 10/23/2015 0734   HDL 55 09/02/2014 0730   CHOLHDL 1.9 10/23/2020 0744   CHOLHDL 2.6 10/23/2015 0734   CHOLHDL 3.1 CALC 05/29/2006 1013   VLDL 24 05/29/2006 1013   LDLCALC 54 10/23/2020 0744   LDLCALC 59 10/23/2015 0734   LDLCALC 64 09/02/2014 0730    Physical Exam:    VS:  BP 132/60   Pulse (!) 57   Ht 5\' 3"  (1.6 m)   Wt 194 lb 12.8 oz (88.4 kg)   SpO2 95%   BMI 34.51 kg/m     Wt Readings from Last 3 Encounters:  01/12/21 194 lb 12.8 oz (88.4 kg)  12/08/20 193 lb 3.2 oz (87.6 kg)  09/26/19 155 lb 12.8 oz (70.7 kg)     GEN:  Well nourished, well developed in no acute distress HEENT: Normal NECK: No JVD; No carotid bruits LYMPHATICS: No lymphadenopathy CARDIAC: RRR, no murmurs, rubs, gallops RESPIRATORY:  Clear to auscultation without rales, wheezing or rhonchi  ABDOMEN: Soft, non-tender, non-distended MUSCULOSKELETAL:  No edema; No deformity  SKIN: Warm and dry NEUROLOGIC:  Alert and oriented x  3 PSYCHIATRIC:  Normal affect   ASSESSMENT:    1. Cryptogenic stroke ([place])   2. Essential hypertension   3. Pure hypercholesterolemia    PLAN:    In order of problems listed above:   #Cryptogenic stroke No atrial fibrillation is of been detected.  Discussed using a loop recorder for ongoing monitoring/surveillance for atrial fibrillation and she wishes to proceed.  I discussed the procedure in detail including the risks.  #Hypertension Controlled.  Continue current regimen.  #Hyperlipidemia Continue rosuvastatin    Follow-up with me  as needed.       Medication Adjustments/Labs and Tests Ordered: Current medicines are reviewed at length with the patient today.  Concerns regarding medicines are outlined above.  No orders of the defined types were placed in this encounter.  No orders of the defined types were placed in this encounter.    Signed, Hilton Cork. Quentin Ore, MD, [facility], [facility] 01/12/2021 12:41 PM    Electrophysiology [facility]    -------------------------------------------------------------------------------------   SURGEON:  Lars Mage, MD    PREPROCEDURE DIAGNOSIS:  Cryptogenic stroke    POSTPROCEDURE DIAGNOSIS:  Cryptogenic stroke     PROCEDURES:   1. Implantable loop recorder implantation    INTRODUCTION:  Tammy Cooper is a 70 y.o. patient with a history of cryptogenic stroke. Inpatient telemetry has been reviewed and not shown atrial fibrillation. The patient therefore presents today for implantable loop implantation.     DESCRIPTION OF PROCEDURE:  Informed written consent was obtained.  The patient required no sedation for the procedure today.  Mapping over the patient's chest was performed to identify the area where electrograms were most prominent for ILR recording.  This area was found to be the left parasternal region over the 4th intercostal space. The patients left chest was therefore prepped and  draped in the usual sterile fashion. The skin overlying the left parasternal region was infiltrated with lidocaine for local analgesia.  A 0.5-cm incision was made over the left parasternal region over the 3rd intercostal space.  A subcutaneous ILR pocket was fashioned using a combination of sharp and blunt dissection.  A Medtronic Reveal Linq model [id number]) implantable loop recorder was then placed into the pocket  R waves were very prominent and measured >0.34mV.  Steri- Strips and a sterile dressing were then applied.  There were no early apparent complications.     CONCLUSIONS:   1. Successful implantation of a Medtronic Reveal LINQ implantable loop recorder for a history of cryptogenic stroke  2. No early apparent complications.   Lars Mage, MD 06/22/2020 3:51 PM

## 2021-01-12 ENCOUNTER — Ambulatory Visit: Payer: Medicare PPO | Admitting: Cardiology

## 2021-01-12 ENCOUNTER — Other Ambulatory Visit: Payer: Self-pay

## 2021-01-12 VITALS — BP 132/60 | HR 57 | Ht 63.0 in | Wt 194.8 lb

## 2021-01-12 DIAGNOSIS — I10 Essential (primary) hypertension: Secondary | ICD-10-CM

## 2021-01-12 DIAGNOSIS — I639 Cerebral infarction, unspecified: Secondary | ICD-10-CM | POA: Diagnosis not present

## 2021-01-12 DIAGNOSIS — E7800 Pure hypercholesterolemia, unspecified: Secondary | ICD-10-CM | POA: Diagnosis not present

## 2021-01-12 NOTE — Patient Instructions (Addendum)
Medication Instructions:  Your physician recommends that you continue on your current medications as directed. Please refer to the Current Medication list given to you today.  Labwork: None ordered.  Testing/Procedures: None ordered.  Follow-Up:  Your physician wants you to follow-up in: as needed with Dr. Quentin Ore.     Implantable Loop Recorder Placement, Care After This sheet gives you information about how to care for yourself after your procedure. Your health care provider may also give you more specific instructions. If you have problems or questions, contact your health care provider. What can I expect after the procedure? After the procedure, it is common to have: Soreness or discomfort near the incision. Some swelling or bruising near the incision.  Follow these instructions at home: Incision care   Leave your outer dressing on for 72 hours.  After 72 hours you can remove your outer dressing and shower. Leave adhesive strips in place. These skin closures may need to stay in place for 1-2 weeks. If adhesive strip edges start to loosen and curl up, you may trim the loose edges.  You may remove the strips if they have not fallen off after 2 weeks. Check your incision area every day for signs of infection. Check for: Redness, swelling, or pain. Fluid or blood. Warmth. Pus or a bad smell. Do not take baths, swim, or use a hot tub until your incision is completely healed. If your wound site starts to bleed apply pressure.      If you have any questions/concerns please call the device clinic at [phone].  Activity  Return to your normal activities.  General instructions Follow instructions from your health care provider about how to manage your implantable loop recorder and transmit the information. Learn how to activate a recording if this is necessary for your type of device. You may go through a metal detection gate, and you may let someone hold a metal detector over  your chest. Show your ID card. Do not have an MRI unless you check with your health care provider first. Take over-the-counter and prescription medicines only as told by your health care provider. Keep all follow-up visits as told by your health care provider. This is important. Contact a health care provider if: You have redness, swelling, or pain around your incision. You have a fever. You have pain that is not relieved by your pain medicine. You have triggered your device because of fainting (syncope) or because of a heartbeat that feels like it is racing, slow, fluttering, or skipping (palpitations). Get help right away if you have: Chest pain. Difficulty breathing. Summary After the procedure, it is common to have soreness or discomfort near the incision. Change your dressing as told by your health care provider. Follow instructions from your health care provider about how to manage your implantable loop recorder and transmit the information. Keep all follow-up visits as told by your health care provider. This is important. This information is not intended to replace advice given to you by your health care provider. Make sure you discuss any questions you have with your health care provider. Document Released: 03/23/2015 Document Revised: 05/27/2017 Document Reviewed: 05/27/2017 Elsevier Patient Education  2020 [employer].

## 2021-02-01 DIAGNOSIS — U071 COVID-19: Secondary | ICD-10-CM | POA: Diagnosis not present

## 2021-02-15 DIAGNOSIS — K7689 Other specified diseases of liver: Secondary | ICD-10-CM | POA: Diagnosis not present

## 2021-02-15 DIAGNOSIS — I700 Atherosclerosis of aorta: Secondary | ICD-10-CM | POA: Diagnosis not present

## 2021-02-15 DIAGNOSIS — R310 Gross hematuria: Secondary | ICD-10-CM | POA: Diagnosis not present

## 2021-02-15 DIAGNOSIS — N2889 Other specified disorders of kidney and ureter: Secondary | ICD-10-CM | POA: Diagnosis not present

## 2021-02-16 ENCOUNTER — Ambulatory Visit (INDEPENDENT_AMBULATORY_CARE_PROVIDER_SITE_OTHER): Payer: Medicare PPO

## 2021-02-16 DIAGNOSIS — I639 Cerebral infarction, unspecified: Secondary | ICD-10-CM | POA: Diagnosis not present

## 2021-02-16 LAB — CUP PACEART REMOTE DEVICE CHECK
Date Time Interrogation Session: 20221025142500
Implantable Pulse Generator Implant Date: 20220920

## 2021-02-22 DIAGNOSIS — R310 Gross hematuria: Secondary | ICD-10-CM | POA: Diagnosis not present

## 2021-02-23 DIAGNOSIS — Z171 Estrogen receptor negative status [ER-]: Secondary | ICD-10-CM

## 2021-02-23 DIAGNOSIS — C50412 Malignant neoplasm of upper-outer quadrant of left female breast: Secondary | ICD-10-CM

## 2021-02-23 HISTORY — DX: Estrogen receptor negative status (ER-): Z17.1

## 2021-02-23 HISTORY — DX: Estrogen receptor negative status (ER-): C50.412

## 2021-02-24 NOTE — Progress Notes (Signed)
Carelink Summary Report / Loop Recorder 

## 2021-02-26 ENCOUNTER — Other Ambulatory Visit: Payer: Self-pay

## 2021-02-26 ENCOUNTER — Ambulatory Visit: Payer: Medicare PPO

## 2021-02-26 ENCOUNTER — Other Ambulatory Visit: Payer: Self-pay | Admitting: *Deleted

## 2021-02-26 ENCOUNTER — Encounter: Payer: Self-pay | Admitting: *Deleted

## 2021-02-26 ENCOUNTER — Telehealth: Payer: Self-pay

## 2021-02-26 ENCOUNTER — Other Ambulatory Visit: Payer: Self-pay | Admitting: Hematology and Oncology

## 2021-02-26 DIAGNOSIS — G459 Transient cerebral ischemic attack, unspecified: Secondary | ICD-10-CM

## 2021-02-26 DIAGNOSIS — C50412 Malignant neoplasm of upper-outer quadrant of left female breast: Secondary | ICD-10-CM

## 2021-02-26 DIAGNOSIS — N6320 Unspecified lump in the left breast, unspecified quadrant: Secondary | ICD-10-CM

## 2021-02-26 NOTE — Progress Notes (Signed)
Received call from pt with complaint of new lump in left breast.  Per MD pt needing Diagnostic Mammo with Tomo of left breast.  Orders placed, appt scheduled and pt notified of date and time. Pt states she has recently had a loop recorder placed and is concerned that the nodule may be related to the loop recorder.  Pt will f/u with cardiologist for their input and will alert our office.

## 2021-02-26 NOTE — Telephone Encounter (Signed)
Successful telephone encounter to patient who strongly feels implanted loop recorder implanted 01/12/21 has caused breast lump in the 2 o'clock position on left breast. Know history of breast cancer with past lumpectomy in same location. Patient has called oncologist who scheduled mammogram and breast ultrasound 03/24/21. Patient is offered device clinic appointment to check for relation to loop just to provide her with piece of mind. Patient will present to clinic today ASAP for assessment.

## 2021-02-26 NOTE — Telephone Encounter (Signed)
The patient states since she got implanted she has a lump in her breast. It is the size of a golf ball and it is sore. No coloration. It is something she can feel and thinks that something is not right. I asked her can she take a picture of the lump? She states she can but it will not show up on a picture. I told her the nurse will give her a call back at [phone].

## 2021-02-26 NOTE — Progress Notes (Signed)
Received call from Trish Fountain, RN with Cardiology who states pt loop recorder was evaluated in office today and the left breast lump is not related to the insertion of the device.  RN placed call to [place] [facility] to schedule pt sooner than 11/30.  Opening available for 11/7 and pt appt moved.  RN placed call to pt to confirm date and time of appt.  RN also scheduled MD f/u to review results of mammogram and [place].

## 2021-02-26 NOTE — Progress Notes (Signed)
Patient presents to device clinic today to have loop recorder placement assessed. Loop implanted 01/12/21 for cryptogenic stroke. Patient is a know breast cancer survivor in same breast with lumpectomy at 2 o'[clock position. Patient noted large painful firm lump`"popped up" approx 3 weeks ago. Described as golf ball size however when assessed today feels larger than golfball. Left lateral side of breast firm in appearance and palpation. Consulted with Dr. Quentin Ore who agrees nodule should not be related to loop since not in immediate proximity of device. Spoke with oncology nurse  Jamison Oka who will attempt to move patient ultrasound and mammogram up from 03/24/21 per patient request. Of note, as patient was leaving device appointment patient states she and husband had covid 3 weeks ago which is when she first noticed lump. Loop noted to remain in correct position as indicated by index and middle finger below. Will defer to oncology at this time for ongoing assessment and diagnosis.

## 2021-03-01 ENCOUNTER — Other Ambulatory Visit: Payer: Self-pay | Admitting: Hematology and Oncology

## 2021-03-01 ENCOUNTER — Ambulatory Visit
Admission: RE | Admit: 2021-03-01 | Discharge: 2021-03-01 | Disposition: A | Discharge status: Home / Self Care | Payer: Medicare PPO | Source: Ambulatory Visit | Attending: Hematology and Oncology | Admitting: Hematology and Oncology

## 2021-03-01 DIAGNOSIS — Z86000 Personal history of in-situ neoplasm of breast: Secondary | ICD-10-CM | POA: Diagnosis not present

## 2021-03-01 DIAGNOSIS — Z853 Personal history of malignant neoplasm of breast: Secondary | ICD-10-CM | POA: Diagnosis not present

## 2021-03-01 DIAGNOSIS — Z170 Estrogen receptor positive status [ER+]: Secondary | ICD-10-CM

## 2021-03-01 DIAGNOSIS — C50412 Malignant neoplasm of upper-outer quadrant of left female breast: Secondary | ICD-10-CM

## 2021-03-01 DIAGNOSIS — R921 Mammographic calcification found on diagnostic imaging of breast: Secondary | ICD-10-CM | POA: Diagnosis not present

## 2021-03-01 DIAGNOSIS — N6320 Unspecified lump in the left breast, unspecified quadrant: Secondary | ICD-10-CM

## 2021-03-01 DIAGNOSIS — R922 Inconclusive mammogram: Secondary | ICD-10-CM | POA: Diagnosis not present

## 2021-03-02 DIAGNOSIS — F430 Acute stress reaction: Secondary | ICD-10-CM | POA: Diagnosis not present

## 2021-03-02 DIAGNOSIS — D0512 Intraductal carcinoma in situ of left breast: Secondary | ICD-10-CM | POA: Diagnosis not present

## 2021-03-02 DIAGNOSIS — Z0000 Encounter for general adult medical examination without abnormal findings: Secondary | ICD-10-CM | POA: Diagnosis not present

## 2021-03-02 DIAGNOSIS — N6320 Unspecified lump in the left breast, unspecified quadrant: Secondary | ICD-10-CM | POA: Diagnosis not present

## 2021-03-02 DIAGNOSIS — Z1389 Encounter for screening for other disorder: Secondary | ICD-10-CM | POA: Diagnosis not present

## 2021-03-02 DIAGNOSIS — I700 Atherosclerosis of aorta: Secondary | ICD-10-CM | POA: Diagnosis not present

## 2021-03-02 DIAGNOSIS — I10 Essential (primary) hypertension: Secondary | ICD-10-CM | POA: Diagnosis not present

## 2021-03-02 DIAGNOSIS — Z23 Encounter for immunization: Secondary | ICD-10-CM | POA: Diagnosis not present

## 2021-03-02 DIAGNOSIS — E7800 Pure hypercholesterolemia, unspecified: Secondary | ICD-10-CM | POA: Diagnosis not present

## 2021-03-03 ENCOUNTER — Ambulatory Visit: Payer: Medicare PPO | Admitting: Hematology and Oncology

## 2021-03-11 ENCOUNTER — Other Ambulatory Visit: Payer: Self-pay

## 2021-03-11 ENCOUNTER — Ambulatory Visit
Admission: RE | Admit: 2021-03-11 | Discharge: 2021-03-11 | Disposition: A | Discharge status: Home / Self Care | Payer: Medicare PPO | Source: Ambulatory Visit | Attending: Hematology and Oncology | Admitting: Hematology and Oncology

## 2021-03-11 DIAGNOSIS — R921 Mammographic calcification found on diagnostic imaging of breast: Secondary | ICD-10-CM | POA: Diagnosis not present

## 2021-03-11 DIAGNOSIS — Z171 Estrogen receptor negative status [ER-]: Secondary | ICD-10-CM | POA: Diagnosis not present

## 2021-03-11 DIAGNOSIS — C50412 Malignant neoplasm of upper-outer quadrant of left female breast: Secondary | ICD-10-CM

## 2021-03-16 NOTE — Progress Notes (Signed)
Patient Care Team: Donald Prose, MD as PCP - General (Family Medicine) Sueanne Margarita, MD as PCP - Cardiology (Cardiology) Vickie Epley, MD as PCP - Electrophysiology (Cardiology) Sueanne Margarita, MD as Consulting Physician (Cardiology)  DIAGNOSIS:    ICD-10-CM   1. Malignant neoplasm of upper-outer quadrant of left breast in female, estrogen receptor positive ([place])  C50.412    Z17.0       SUMMARY OF ONCOLOGIC HISTORY: Oncology History  Breast cancer of upper-outer quadrant of left female breast ([place])  05/22/2014 Breast MRI   Left breast 12:00 position irregular nodular enhancement on 0.6 x 1.2 x 0.8 cm, no abnormal lymph nodes   07/31/2014 Surgery   Left breast lumpectomy: DCIS with calcifications 1 cm size ER 99%, PR 99%   09/29/2014 - 10/31/2014 Radiation Therapy   Adjuvant radiation   11/18/2014 -  Anti-estrogen oral therapy   anastrozole 1 mg daily 5 years   03/11/2021 Relapse/Recurrence   Patient felt a lump and there were 2.5 cm of suspicious calcifications which on biopsy came back as microscopic focus of IDC with DCIS     CHIEF COMPLIANT: Follow-up of left breast cancer on anastrozole therapy  INTERVAL HISTORY: Tammy Cooper is a 70 y.o. with above-mentioned history of left breast cancer treated with lumpectomy, radiation, and who is currently on antiestrogen therapy with anastrozole. Mammogram on 03/01/2021 showed suspicious calcifications in UOQ of left breast. Biopsy on 03/11/2021 showed microscopic focus of invasive ductal carcinoma and DCIS. She presents to the clinic today for follow-up.   ALLERGIES:  is allergic to lipitor [atorvastatin] and adhesive [tape].  MEDICATIONS:  Current Outpatient Medications  Medication Sig Dispense Refill   aspirin EC 81 MG tablet Take 1 tablet (81 mg total) by mouth daily. 90 tablet 3   Co-Enzyme Q10 200 MG CAPS Take 300 mg by mouth daily. 90 capsule 3   ezetimibe (ZETIA) 10 MG tablet Take 1 tablet (10 mg total)  by mouth daily. 90 tablet 3   Multiple Vitamin (MULTIVITAMIN WITH MINERALS) TABS tablet Take 1 tablet by mouth daily.     rosuvastatin (CRESTOR) 10 MG tablet Take 1 tablet (10 mg total) by mouth daily. 90 tablet 3   telmisartan (MICARDIS) 40 MG tablet Take 1 tablet (40 mg total) by mouth daily. 90 tablet 3   No current facility-administered medications for this visit.    PHYSICAL EXAMINATION: ECOG PERFORMANCE STATUS: 1 - Symptomatic but completely ambulatory  Vitals:   03/17/21 1324  BP: (!) 159/59  Pulse: 68  Resp: 17  Temp: (!) 97.5 F (36.4 C)  SpO2: 100%   Filed Weights   03/17/21 1324  Weight: 201 lb 1.6 oz (91.2 kg)       LABORATORY DATA:  I have reviewed the data as listed CMP Latest Ref Rng & Units 12/08/2020 10/23/2020 12/12/2018  Glucose 65 - 99 mg/dL 93 - 112(H)  BUN 8 - 27 mg/dL 15 - 16  Creatinine 0.57 - 1.00 mg/dL 0.65 - 0.76  Sodium 134 - 144 mmol/L 142 - 141  Potassium 3.5 - 5.2 mmol/L 4.5 - 4.2  Chloride 96 - 106 mmol/L 102 - 101  CO2 20 - 29 mmol/L 21 - 21  Calcium 8.7 - 10.3 mg/dL 9.6 - 9.8  Total Protein 6.0 - 8.5 g/dL - - -  Total Bilirubin 0.0 - 1.2 mg/dL - - -  Alkaline Phos 39 - 117 IU/L - - -  AST 0 - 40 IU/L - - -  ALT 0 - 32 IU/L - 20 25    Lab Results  Component Value Date   WBC 11.9 (H) 12/02/2015   HGB 11.3 (L) 12/02/2015   HCT 34.3 (L) 12/02/2015   MCV 89.8 12/02/2015   PLT 255 12/02/2015   NEUTROABS 3.8 12/07/2006    ASSESSMENT & PLAN:  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 Prognosis: Based on [facility] prognostic nomogram, with antiestrogen therapy and radiation therapy her 5 year risk of recurrence of 1% in 10 year risk is a 2%. Adjuvant radiation completed 10/31/2014 Current treatment: Adjuvant anastrozole 1 mg daily ( chosen because of history of TIA) since 11/18/2014-2021   Major depression: Much improved with  Effexor XR 37.5 mg once daily. This has been helping  both depression and hot flashes.   Anastrozole toxicities: 1. Hot flashes: Continues to be a significant problem but she is willing to put up with them. 2. Mood swings: improved  with Effexor.  She completed 5 years of antiestrogen therapy and discontinued anastrozole in 2021.  Relapse/recurrence:Patient felt a lump and there were 2.5 cm of suspicious calcifications which on biopsy came back as microscopic focus of IDC with DCIS   Treatment plan: Patient chooses to undergo bilateral mastectomies followed by antiestrogen therapy if it is ER/PR positive.  If it is triple negative or HER2 positive she will need chemotherapy. The prognostic panel is still pending and we will call her when they are available. Return to clinic after surgery to discuss results.  No orders of the defined types were placed in this encounter.  The patient has a good understanding of the overall plan. she agrees with it. she will call with any problems that may develop before the next visit here.  Total time spent: 30 mins including face to face time and time spent for planning, charting and coordination of care  Rulon Eisenmenger, MD, MPH 03/17/2021  I, Thana Ates, am acting as scribe for Dr. Nicholas Lose.  I have reviewed the above documentation for accuracy and completeness, and I agree with the above.

## 2021-03-17 ENCOUNTER — Other Ambulatory Visit: Payer: Self-pay

## 2021-03-17 ENCOUNTER — Inpatient Hospital Stay: Payer: Medicare PPO | Attending: Hematology and Oncology | Admitting: Hematology and Oncology

## 2021-03-17 DIAGNOSIS — C50412 Malignant neoplasm of upper-outer quadrant of left female breast: Secondary | ICD-10-CM | POA: Insufficient documentation

## 2021-03-17 DIAGNOSIS — Z170 Estrogen receptor positive status [ER+]: Secondary | ICD-10-CM | POA: Diagnosis not present

## 2021-03-17 DIAGNOSIS — Z8673 Personal history of transient ischemic attack (TIA), and cerebral infarction without residual deficits: Secondary | ICD-10-CM | POA: Insufficient documentation

## 2021-03-17 DIAGNOSIS — Z79899 Other long term (current) drug therapy: Secondary | ICD-10-CM | POA: Insufficient documentation

## 2021-03-17 DIAGNOSIS — F329 Major depressive disorder, single episode, unspecified: Secondary | ICD-10-CM | POA: Diagnosis not present

## 2021-03-17 DIAGNOSIS — Z9013 Acquired absence of bilateral breasts and nipples: Secondary | ICD-10-CM | POA: Diagnosis not present

## 2021-03-17 DIAGNOSIS — N951 Menopausal and female climacteric states: Secondary | ICD-10-CM | POA: Diagnosis not present

## 2021-03-17 MED ORDER — LETROZOLE 2.5 MG PO TABS: 2.5000 mg | ORAL_TABLET | Freq: Every day | ORAL | 0 refills | Status: DC

## 2021-03-17 MED ORDER — ALPRAZOLAM 0.25 MG PO TABS: 0.2500 mg | ORAL_TABLET | Freq: Every evening | ORAL | 0 refills | Status: DC | PRN

## 2021-03-17 NOTE — Assessment & Plan Note (Signed)
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 Prognosis: Based on [facility] prognostic nomogram, with antiestrogen therapy and radiation therapy her 5 year risk of recurrence of 1% in 10 year risk is a 2%. Adjuvant radiation completed 10/31/2014 Current treatment: Adjuvant anastrozole 1 mg daily ( chosen because of history of TIA) since 11/18/2014-2021  Major depression: Much improved withEffexor XR 37.5 mg once daily. This has been helping both depression and hot flashes.  Anastrozole toxicities: 1. Hot flashes:Continues to be a significant problem but she is willing to put up with them. 2. Mood swings:improved with Effexor.  She completed 5 years of antiestrogen therapy and discontinued anastrozole in 2021.  Relapse/recurrence:Patient felt a lump and there were 2.5 cm of suspicious calcifications which on biopsy came back as microscopic focus of IDC with DCIS  Treatment plan: Breast conserving surgery followed by antiestrogen therapy if it is ER/PR positive

## 2021-03-22 ENCOUNTER — Ambulatory Visit (INDEPENDENT_AMBULATORY_CARE_PROVIDER_SITE_OTHER): Payer: Medicare PPO

## 2021-03-22 DIAGNOSIS — I639 Cerebral infarction, unspecified: Secondary | ICD-10-CM

## 2021-03-22 LAB — CUP PACEART REMOTE DEVICE CHECK
Date Time Interrogation Session: 20221127142653
Implantable Pulse Generator Implant Date: 20220920

## 2021-03-24 ENCOUNTER — Other Ambulatory Visit: Payer: Medicare PPO

## 2021-03-26 DIAGNOSIS — C50012 Malignant neoplasm of nipple and areola, left female breast: Secondary | ICD-10-CM | POA: Diagnosis not present

## 2021-03-29 ENCOUNTER — Encounter: Payer: Self-pay | Admitting: *Deleted

## 2021-03-30 DIAGNOSIS — Z170 Estrogen receptor positive status [ER+]: Secondary | ICD-10-CM | POA: Diagnosis not present

## 2021-03-30 DIAGNOSIS — C50412 Malignant neoplasm of upper-outer quadrant of left female breast: Secondary | ICD-10-CM | POA: Diagnosis not present

## 2021-03-30 DIAGNOSIS — Z923 Personal history of irradiation: Secondary | ICD-10-CM | POA: Diagnosis not present

## 2021-03-30 NOTE — Progress Notes (Signed)
Carelink Summary Report / Loop Recorder 

## 2021-03-31 ENCOUNTER — Encounter: Payer: Self-pay | Admitting: *Deleted

## 2021-03-31 ENCOUNTER — Encounter: Payer: Self-pay | Admitting: Hematology and Oncology

## 2021-03-31 ENCOUNTER — Encounter (HOSPITAL_BASED_OUTPATIENT_CLINIC_OR_DEPARTMENT_OTHER): Payer: Self-pay | Admitting: Surgery

## 2021-03-31 ENCOUNTER — Other Ambulatory Visit: Payer: Self-pay

## 2021-04-01 ENCOUNTER — Telehealth: Payer: Self-pay | Admitting: Hematology and Oncology

## 2021-04-01 NOTE — Telephone Encounter (Signed)
Scheduled per sch msg. Called and left msg  

## 2021-04-05 ENCOUNTER — Other Ambulatory Visit: Payer: Self-pay | Admitting: Surgery

## 2021-04-06 MED ORDER — ENSURE PRE-SURGERY PO LIQD: 296.0000 mL | Freq: Once | ORAL | Status: DC

## 2021-04-06 NOTE — H&P (Signed)
REFERRING PHYSICIAN: Rulon Eisenmenger, MD  PROVIDER: Beverlee Nims, MD  MRN: [id number] DOB: [birth year] DATE OF ENCOUNTER: 03/26/2021 Subjective  Chief Complaint: Breast Cancer   History of Present Illness: Tammy Cooper is a 70 y.o. female who is seen today as an office consultation at the request of Dr. Lindi Adie for evaluation of Breast Cancer .   This patient is well-known to me. 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 She had actually palpated a mass in her breast not long after having a loop recorder placed along the left chest for cardiac monitoring. This was because of of a retinal issue with a blood clot to the right eye. No source was ever identified and her cardiac status is normal. She started feeling a mass in the breast which then prompted mammograms showing a 2.5 cm area of calcifications in the area of her previous lumpectomy. 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 There was minimal tissue available for markers but the initial showed a negative ER PR and HER2 status with a Ki-67 of less than 1%. She is already seen medical oncology. She can no longer have radiation and she has had prior radiation to the left breast. She already has decided that she wants to proceed with bilateral mastectomies and immediate reconstruction. She has an appointment to see the plastic surgeon next Tuesday. She is otherwise been doing well.  Review of Systems: A complete review of systems was obtained from the patient. I have reviewed this information and discussed as appropriate with the patient. See HPI as well for other ROS.  ROS   Medical History: Past Medical History:  Diagnosis Date   BRAO (branch retinal artery occlusion), right 10/2020   History of cancer chemotherapy   History of  radiation therapy   Hyperlipidemia   Hypertension   Vision abnormalities   Patient Active Problem List  Diagnosis   Acquired absence of organ, genital organs   Breast cancer of upper-outer quadrant of left female breast (CMS-HCC)   Carotid stenosis, asymptomatic, right   Chest pain   Depression   Essential hypertension   Herpes simplex without mention of complication   Hyperlipidemia   Knee joint replaced by other means   Obesity, unspecified   Osteoarthrosis, unspecified whether generalized or localized, lower leg   Other dyspnea and respiratory abnormality   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 or other cause, without mention of thyrotoxic crisis or storm   Unspecified transient cerebral ischemia   History of radiation therapy   History of cancer chemotherapy   BRAO (branch retinal artery occlusion), right   Past Surgical History:  Procedure Laterality Date   ARTHROSCOPIC ROTATOR CUFF REPAIR Right 2007   HEMORRHOID SURGERY N/A 2012   LAPAROSCOPIC HYSTERECTOMY TOTAL 2003   MASTECTOMY PARTIAL / LUMPECTOMY Left 2016   REPLACEMENT TOTAL KNEE BILATERAL Bilateral 2017   SPINAL FUSION L4-L5 N/A 2012   TONSILLECTOMY & ADENOIDECTOMY Bilateral 1958    Allergies  Allergen Reactions   Atorvastatin Muscle Pain  Muscle aches   Other Itching and Rash  Surgical glue   Current Outpatient Medications on File Prior to Visit  Medication Sig Dispense Refill   aspirin 81 MG EC tablet Take 1 tablet by mouth once daily   co-enzyme Q-10, ubiquinone, 200 mg capsule  Take 200 mg by mouth once daily   ezetimibe (ZETIA) 10 mg tablet Take 10 mg by mouth once daily   rosuvastatin (CRESTOR) 10 MG tablet Take 10 mg by mouth once daily   telmisartan (MICARDIS) 40 MG tablet Take 40 mg by mouth once daily   ZINC ORAL Take by mouth once daily   No current facility-administered medications on file prior to visit.   Family History  Problem Relation Age of Onset   High blood pressure  (Hypertension) Mother   Cataracts Mother   Hyperlipidemia (Elevated cholesterol) Mother   Lung cancer Father   Coronary Artery Disease (Blocked arteries around heart) Brother   Diabetes type II Maternal Grandfather   Diabetes Maternal Grandfather   Stroke Maternal Grandfather   Glaucoma Neg Hx   Macular degeneration Neg Hx   Diabetes type I Neg Hx   Thyroid disease Neg Hx   Rheum arthritis Neg Hx   Graves' disease Neg Hx   Hashimoto's thyroiditis Neg Hx   Hyperthyroidism Neg Hx   Hypothyroidism Neg Hx   Autoimmune disease Neg Hx    Social History   Tobacco Use  Smoking Status Never  Smokeless Tobacco Never    Social History   Socioeconomic History   Marital status: Married  Tobacco Use   Smoking status: Never   Smokeless tobacco: Never  Substance and Sexual Activity   Alcohol use: Yes  Alcohol/week: 2.0 standard drinks  Types: 2 Standard drinks or equivalent per week  Comment: socially  Social History Narrative  Lives in [place] with her husband. No pets or smoking in the home. 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   Objective:   Vitals:  03/26/21 1005  BP: 130/72  Pulse: (!) 44  Temp: 36.7 C (98.1 F)  SpO2: 91%  Weight: 91 kg (200 lb 9.6 oz)  Height: 160 cm (5' 3")   Body mass index is 35.53 kg/m.  Physical Exam   She appears well on exam.  There is a fullness in the upper outer quadrant of the left breast. There is no axillary adenopathy on either side. There are no other palpable abnormalities. Her old scar is well-healed.  Lungs clear CV RRR Abdomen soft, NT Neuro grossly intact  Labs, Imaging and Diagnostic Testing: I reviewed her mammogram and pathology results  Assessment and Plan:  Diagnoses and all orders for this visit:  Malignant neoplasm of nipple of left breast in female, unspecified estrogen receptor status (CMS-HCC)    This is a patient with recurrent DCIS of the left breast as well as  a small focus of invasive cancer. Again, she is already seen medical oncology wants to proceed with bilateral mastectomies which I feel is reasonable given her recurrence and current situation. She is already got a scheduled appoint with plastic surgeons for immediate reconstruction at the time of mastectomies. We have discussed her at length in a multidisciplinary breast cancer conference as well. The breast will again be sent off for prognostic indicators as well as possible Oncotype so we will hold on any Port-A-Cath insertion. I discussed the risk of my procedure which includes but is not limited to bleeding, infection, injury to surrounding structures, finds a malignancy in the other breast which will be sent to pathology, etc. surgery will be scheduled ASAP   Addendum: She has been evaluated by plastic surgery and is now decided to forego immediate reconstruction and just proceed with the bilateral mastectomies.

## 2021-04-06 NOTE — Progress Notes (Signed)

## 2021-04-07 ENCOUNTER — Other Ambulatory Visit: Payer: Self-pay

## 2021-04-07 ENCOUNTER — Ambulatory Visit (HOSPITAL_BASED_OUTPATIENT_CLINIC_OR_DEPARTMENT_OTHER): Payer: Medicare PPO | Admitting: Anesthesiology

## 2021-04-07 ENCOUNTER — Observation Stay (HOSPITAL_BASED_OUTPATIENT_CLINIC_OR_DEPARTMENT_OTHER)
Admission: RE | Admit: 2021-04-07 | Discharge: 2021-04-08 | Disposition: A | Discharge status: Home / Self Care | Payer: Medicare PPO | Attending: Surgery | Admitting: Surgery

## 2021-04-07 ENCOUNTER — Encounter (HOSPITAL_BASED_OUTPATIENT_CLINIC_OR_DEPARTMENT_OTHER)
Admission: RE | Disposition: A | Discharge status: Home / Self Care | Payer: Self-pay | Source: Home / Self Care | Attending: Surgery

## 2021-04-07 ENCOUNTER — Encounter (HOSPITAL_BASED_OUTPATIENT_CLINIC_OR_DEPARTMENT_OTHER): Payer: Self-pay | Admitting: Surgery

## 2021-04-07 DIAGNOSIS — Z8679 Personal history of other diseases of the circulatory system: Secondary | ICD-10-CM | POA: Insufficient documentation

## 2021-04-07 DIAGNOSIS — C50412 Malignant neoplasm of upper-outer quadrant of left female breast: Secondary | ICD-10-CM | POA: Diagnosis not present

## 2021-04-07 DIAGNOSIS — E7800 Pure hypercholesterolemia, unspecified: Secondary | ICD-10-CM | POA: Diagnosis not present

## 2021-04-07 DIAGNOSIS — Z7982 Long term (current) use of aspirin: Secondary | ICD-10-CM | POA: Insufficient documentation

## 2021-04-07 DIAGNOSIS — E0580 Other thyrotoxicosis without thyrotoxic crisis or storm: Secondary | ICD-10-CM | POA: Diagnosis not present

## 2021-04-07 DIAGNOSIS — Z923 Personal history of irradiation: Secondary | ICD-10-CM | POA: Insufficient documentation

## 2021-04-07 DIAGNOSIS — Z171 Estrogen receptor negative status [ER-]: Secondary | ICD-10-CM | POA: Diagnosis not present

## 2021-04-07 DIAGNOSIS — F418 Other specified anxiety disorders: Secondary | ICD-10-CM | POA: Diagnosis not present

## 2021-04-07 DIAGNOSIS — D0512 Intraductal carcinoma in situ of left breast: Secondary | ICD-10-CM | POA: Diagnosis not present

## 2021-04-07 DIAGNOSIS — Z9221 Personal history of antineoplastic chemotherapy: Secondary | ICD-10-CM | POA: Diagnosis not present

## 2021-04-07 DIAGNOSIS — Z9013 Acquired absence of bilateral breasts and nipples: Secondary | ICD-10-CM

## 2021-04-07 DIAGNOSIS — Z96653 Presence of artificial knee joint, bilateral: Secondary | ICD-10-CM | POA: Diagnosis not present

## 2021-04-07 DIAGNOSIS — I10 Essential (primary) hypertension: Secondary | ICD-10-CM | POA: Diagnosis not present

## 2021-04-07 DIAGNOSIS — Z79899 Other long term (current) drug therapy: Secondary | ICD-10-CM | POA: Insufficient documentation

## 2021-04-07 DIAGNOSIS — N6021 Fibroadenosis of right breast: Secondary | ICD-10-CM | POA: Diagnosis not present

## 2021-04-07 DIAGNOSIS — N6011 Diffuse cystic mastopathy of right breast: Secondary | ICD-10-CM | POA: Diagnosis not present

## 2021-04-07 DIAGNOSIS — C50912 Malignant neoplasm of unspecified site of left female breast: Secondary | ICD-10-CM | POA: Diagnosis not present

## 2021-04-07 HISTORY — DX: Depression, unspecified: F32.A

## 2021-04-07 HISTORY — PX: TOTAL MASTECTOMY: SHX6129

## 2021-04-07 SURGERY — MASTECTOMY, SIMPLE
Anesthesia: General | Site: Breast | Laterality: Bilateral

## 2021-04-07 MED ORDER — ALPRAZOLAM 0.25 MG PO TABS: 0.2500 mg | ORAL_TABLET | Freq: Every evening | ORAL | Status: DC | PRN

## 2021-04-07 MED ORDER — ACETAMINOPHEN 500 MG PO TABS
ORAL_TABLET | ORAL | Status: AC
  Filled 2021-04-07: qty 2

## 2021-04-07 MED ORDER — FENTANYL CITRATE (PF) 100 MCG/2ML IJ SOLN
INTRAMUSCULAR | Status: AC
  Filled 2021-04-07: qty 2

## 2021-04-07 MED ORDER — ACETAMINOPHEN 500 MG PO TABS
1000.0000 mg | ORAL_TABLET | ORAL | Status: AC
  Administered 2021-04-07: 11:00:00 1000 mg via ORAL

## 2021-04-07 MED ORDER — OXYCODONE HCL 5 MG PO TABS
5.0000 mg | ORAL_TABLET | ORAL | Status: DC | PRN
  Administered 2021-04-07 (×2): 5 mg via ORAL
  Filled 2021-04-07 (×2): qty 1

## 2021-04-07 MED ORDER — ONDANSETRON 4 MG PO TBDP: 4.0000 mg | ORAL_TABLET | Freq: Four times a day (QID) | ORAL | Status: DC | PRN

## 2021-04-07 MED ORDER — ONDANSETRON HCL 4 MG/2ML IJ SOLN
INTRAMUSCULAR | Status: DC | PRN
  Administered 2021-04-07: 4 mg via INTRAVENOUS

## 2021-04-07 MED ORDER — ONDANSETRON HCL 4 MG/2ML IJ SOLN: 4.0000 mg | Freq: Four times a day (QID) | INTRAMUSCULAR | Status: DC | PRN

## 2021-04-07 MED ORDER — LACTATED RINGERS IV SOLN: INTRAVENOUS | Status: DC

## 2021-04-07 MED ORDER — CO-ENZYME Q10 200 MG PO CAPS: 300.0000 mg | ORAL_CAPSULE | Freq: Every day | ORAL | Status: DC

## 2021-04-07 MED ORDER — CEFAZOLIN SODIUM-DEXTROSE 2-4 GM/100ML-% IV SOLN
INTRAVENOUS | Status: AC
  Filled 2021-04-07: qty 100

## 2021-04-07 MED ORDER — METHOCARBAMOL 500 MG PO TABS
500.0000 mg | ORAL_TABLET | Freq: Four times a day (QID) | ORAL | Status: DC | PRN
  Administered 2021-04-07 – 2021-04-08 (×2): 500 mg via ORAL
  Filled 2021-04-07 (×2): qty 1

## 2021-04-07 MED ORDER — ROSUVASTATIN CALCIUM 10 MG PO TABS
10.0000 mg | ORAL_TABLET | Freq: Every day | ORAL | Status: DC
  Filled 2021-04-07: qty 1

## 2021-04-07 MED ORDER — 0.9 % SODIUM CHLORIDE (POUR BTL) OPTIME
TOPICAL | Status: DC | PRN
  Administered 2021-04-07: 13:00:00 2000 mL

## 2021-04-07 MED ORDER — ENOXAPARIN SODIUM 40 MG/0.4ML IJ SOSY: 40.0000 mg | PREFILLED_SYRINGE | INTRAMUSCULAR | Status: DC

## 2021-04-07 MED ORDER — EPHEDRINE SULFATE 50 MG/ML IJ SOLN
INTRAMUSCULAR | Status: DC | PRN
  Administered 2021-04-07 (×4): 5 mg via INTRAVENOUS
  Administered 2021-04-07: 10 mg via INTRAVENOUS

## 2021-04-07 MED ORDER — EPHEDRINE 5 MG/ML INJ
INTRAVENOUS | Status: AC
  Filled 2021-04-07: qty 5

## 2021-04-07 MED ORDER — TRAMADOL HCL 50 MG PO TABS
50.0000 mg | ORAL_TABLET | Freq: Four times a day (QID) | ORAL | Status: DC | PRN
  Administered 2021-04-08: 50 mg via ORAL
  Filled 2021-04-07: qty 1

## 2021-04-07 MED ORDER — DEXAMETHASONE SODIUM PHOSPHATE 4 MG/ML IJ SOLN
INTRAMUSCULAR | Status: DC | PRN
  Administered 2021-04-07: 10 mg via INTRAVENOUS

## 2021-04-07 MED ORDER — LIDOCAINE 2% (20 MG/ML) 5 ML SYRINGE
INTRAMUSCULAR | Status: DC | PRN
  Administered 2021-04-07: 60 mg via INTRAVENOUS

## 2021-04-07 MED ORDER — PROPOFOL 10 MG/ML IV BOLUS
INTRAVENOUS | Status: DC | PRN
  Administered 2021-04-07: 200 mg via INTRAVENOUS

## 2021-04-07 MED ORDER — CEFAZOLIN SODIUM-DEXTROSE 2-4 GM/100ML-% IV SOLN
2.0000 g | INTRAVENOUS | Status: AC
  Administered 2021-04-07: 12:00:00 2 g via INTRAVENOUS

## 2021-04-07 MED ORDER — OXYCODONE HCL 5 MG PO TABS: 5.0000 mg | ORAL_TABLET | Freq: Once | ORAL | Status: DC | PRN

## 2021-04-07 MED ORDER — POTASSIUM CHLORIDE IN NACL 20-0.9 MEQ/L-% IV SOLN
INTRAVENOUS | Status: DC
  Filled 2021-04-07: qty 1000

## 2021-04-07 MED ORDER — HYDROMORPHONE HCL 1 MG/ML IJ SOLN: 1.0000 mg | INTRAMUSCULAR | Status: DC | PRN

## 2021-04-07 MED ORDER — LIDOCAINE 2% (20 MG/ML) 5 ML SYRINGE
INTRAMUSCULAR | Status: AC
  Filled 2021-04-07: qty 5

## 2021-04-07 MED ORDER — CHLORHEXIDINE GLUCONATE CLOTH 2 % EX PADS: 6.0000 | MEDICATED_PAD | Freq: Once | CUTANEOUS | Status: DC

## 2021-04-07 MED ORDER — FENTANYL CITRATE (PF) 100 MCG/2ML IJ SOLN
INTRAMUSCULAR | Status: DC | PRN
  Administered 2021-04-07: 50 ug via INTRAVENOUS
  Administered 2021-04-07: 75 ug via INTRAVENOUS
  Administered 2021-04-07: 25 ug via INTRAVENOUS
  Administered 2021-04-07: 50 ug via INTRAVENOUS

## 2021-04-07 MED ORDER — IRBESARTAN 150 MG PO TABS
150.0000 mg | ORAL_TABLET | Freq: Every day | ORAL | Status: DC
  Filled 2021-04-07: qty 1

## 2021-04-07 MED ORDER — OXYCODONE HCL 5 MG/5ML PO SOLN: 5.0000 mg | Freq: Once | ORAL | Status: DC | PRN

## 2021-04-07 MED ORDER — HYDROMORPHONE HCL 1 MG/ML IJ SOLN
INTRAMUSCULAR | Status: AC
  Filled 2021-04-07: qty 0.5

## 2021-04-07 MED ORDER — DROPERIDOL 2.5 MG/ML IJ SOLN
INTRAMUSCULAR | Status: AC
  Filled 2021-04-07: qty 2

## 2021-04-07 MED ORDER — ONDANSETRON HCL 4 MG/2ML IJ SOLN
INTRAMUSCULAR | Status: AC
  Filled 2021-04-07: qty 2

## 2021-04-07 MED ORDER — DIPHENHYDRAMINE HCL 12.5 MG/5ML PO ELIX: 12.5000 mg | ORAL_SOLUTION | Freq: Four times a day (QID) | ORAL | Status: DC | PRN

## 2021-04-07 MED ORDER — HYDROMORPHONE HCL 1 MG/ML IJ SOLN
0.2500 mg | INTRAMUSCULAR | Status: DC | PRN
  Administered 2021-04-07 (×3): 0.5 mg via INTRAVENOUS

## 2021-04-07 MED ORDER — DIPHENHYDRAMINE HCL 50 MG/ML IJ SOLN: 12.5000 mg | Freq: Four times a day (QID) | INTRAMUSCULAR | Status: DC | PRN

## 2021-04-07 MED ORDER — DEXAMETHASONE SODIUM PHOSPHATE 10 MG/ML IJ SOLN
INTRAMUSCULAR | Status: AC
  Filled 2021-04-07: qty 1

## 2021-04-07 MED ORDER — DROPERIDOL 2.5 MG/ML IJ SOLN
INTRAMUSCULAR | Status: DC | PRN
  Administered 2021-04-07: .625 mg via INTRAVENOUS

## 2021-04-07 MED ORDER — PROMETHAZINE HCL 25 MG/ML IJ SOLN: 6.2500 mg | INTRAMUSCULAR | Status: DC | PRN

## 2021-04-07 MED ORDER — EZETIMIBE 10 MG PO TABS
10.0000 mg | ORAL_TABLET | Freq: Every day | ORAL | Status: DC
  Filled 2021-04-07: qty 1

## 2021-04-07 SURGICAL SUPPLY — 37 items
APL PRP STRL LF DISP 70% ISPRP (MISCELLANEOUS) ×2
BIOPATCH RED 1 DISK 7.0 (GAUZE/BANDAGES/DRESSINGS) ×2 IMPLANT
BLADE SURG 10 STRL SS (BLADE) ×2 IMPLANT
CANISTER SUCT 1200ML W/VALVE (MISCELLANEOUS) ×2 IMPLANT
CHLORAPREP W/TINT 26 (MISCELLANEOUS) ×3 IMPLANT
COVER BACK TABLE 60X90IN (DRAPES) ×2 IMPLANT
COVER MAYO STAND STRL (DRAPES) ×2 IMPLANT
DRAIN CHANNEL 19F RND (DRAIN) ×3 IMPLANT
DRAPE LAPAROSCOPIC ABDOMINAL (DRAPES) ×2 IMPLANT
DRAPE UTILITY XL STRL (DRAPES) ×2 IMPLANT
DRSG PAD ABDOMINAL 8X10 ST (GAUZE/BANDAGES/DRESSINGS) ×3 IMPLANT
DRSG TEGADERM 2-3/8X2-3/4 SM (GAUZE/BANDAGES/DRESSINGS) ×2 IMPLANT
ELECT REM PT RETURN 9FT ADLT (ELECTROSURGICAL) ×2
ELECTRODE REM PT RTRN 9FT ADLT (ELECTROSURGICAL) ×1 IMPLANT
EVACUATOR SILICONE 100CC (DRAIN) ×3 IMPLANT
GAUZE SPONGE 4X4 12PLY STRL LF (GAUZE/BANDAGES/DRESSINGS) ×10 IMPLANT
GLOVE SURG POLYISO LF SZ6.5 (GLOVE) ×1 IMPLANT
GLOVE SURG SIGNA 7.5 PF LTX (GLOVE) ×2 IMPLANT
GLOVE SURG UNDER POLY LF SZ7 (GLOVE) ×2 IMPLANT
GOWN STRL REUS W/ TWL LRG LVL3 (GOWN DISPOSABLE) ×1 IMPLANT
GOWN STRL REUS W/ TWL XL LVL3 (GOWN DISPOSABLE) ×1 IMPLANT
GOWN STRL REUS W/TWL LRG LVL3 (GOWN DISPOSABLE) ×4
GOWN STRL REUS W/TWL XL LVL3 (GOWN DISPOSABLE) ×2
NS IRRIG 1000ML POUR BTL (IV SOLUTION) ×3 IMPLANT
PACK BASIN DAY SURGERY FS (CUSTOM PROCEDURE TRAY) ×2 IMPLANT
PENCIL SMOKE EVACUATOR (MISCELLANEOUS) ×2 IMPLANT
PIN SAFETY STERILE (MISCELLANEOUS) ×2 IMPLANT
SLEEVE SCD COMPRESS KNEE MED (STOCKING) ×2 IMPLANT
SPONGE T-LAP 18X18 ~~LOC~~+RFID (SPONGE) ×3 IMPLANT
SUT ETHILON 3 0 PS 1 (SUTURE) ×3 IMPLANT
SUT MNCRL AB 4-0 PS2 18 (SUTURE) ×4 IMPLANT
SUT SILK 2 0 SH (SUTURE) ×1 IMPLANT
SUT VIC AB 3-0 SH 27 (SUTURE) ×4
SUT VIC AB 3-0 SH 27X BRD (SUTURE) ×2 IMPLANT
TOWEL GREEN STERILE FF (TOWEL DISPOSABLE) ×2 IMPLANT
TUBE CONNECTING 20X1/4 (TUBING) ×2 IMPLANT
YANKAUER SUCT BULB TIP NO VENT (SUCTIONS) ×2 IMPLANT

## 2021-04-07 NOTE — Anesthesia Postprocedure Evaluation (Signed)
Anesthesia Post Note  Patient: Tammy Cooper  Procedure(s) Performed: BILATERAL TOTAL MASTECTOMY (Bilateral: Breast)     Patient location during evaluation: PACU Anesthesia Type: General Level of consciousness: awake and alert Pain management: pain level controlled Vital Signs Assessment: post-procedure vital signs reviewed and stable Respiratory status: spontaneous breathing, nonlabored ventilation and respiratory function stable Cardiovascular status: blood pressure returned to baseline and stable Postop Assessment: no apparent nausea or vomiting Anesthetic complications: no   No notable events documented.  Last Vitals:  Vitals:   04/07/21 1514 04/07/21 1515  BP: (!) 119/57 (!) 119/57  Pulse: 61 62  Resp: 11 (!) 9  Temp:    SpO2: 94% 91%    Last Pain:  Vitals:   04/07/21 1500  TempSrc:   PainSc: Asleep                 Lynda Rainwater

## 2021-04-07 NOTE — Anesthesia Procedure Notes (Signed)
Procedure Name: LMA Insertion Date/Time: 04/07/2021 12:09 PM Performed by: Ezequiel Kayser, CRNA Pre-anesthesia Checklist: Patient identified, Emergency Drugs available, Suction available and Patient being monitored Patient Re-evaluated:Patient Re-evaluated prior to induction Oxygen Delivery Method: Circle System Utilized Preoxygenation: Pre-oxygenation with 100% oxygen Induction Type: IV induction Ventilation: Mask ventilation without difficulty LMA: LMA inserted LMA Size: 4.0 Number of attempts: 1 Airway Equipment and Method: Bite block Placement Confirmation: positive ETCO2 Tube secured with: Tape Dental Injury: Teeth and Oropharynx as per pre-operative assessment

## 2021-04-07 NOTE — Transfer of Care (Signed)
Immediate Anesthesia Transfer of Care Note  Patient: Tammy Cooper  Procedure(s) Performed: BILATERAL TOTAL MASTECTOMY (Bilateral: Breast)  Patient Location: PACU  Anesthesia Type:General  Level of Consciousness: drowsy  Airway & Oxygen Therapy: Patient Spontanous Breathing and Patient connected to face mask oxygen  Post-op Assessment: Report given to RN and Post -op Vital signs reviewed and stable  Post vital signs: Reviewed and stable  Last Vitals:  Vitals Value Taken Time  BP 138/47 04/07/21 1357  Temp    Pulse 71 04/07/21 1358  Resp 13 04/07/21 1358  SpO2 93 % 04/07/21 1358  Vitals shown include unvalidated device data.  Last Pain:  Vitals:   04/07/21 1110  TempSrc: Oral  PainSc: 0-No pain         Complications: No notable events documented.

## 2021-04-07 NOTE — Op Note (Signed)
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  There was also a possible small focus of invasive cancer.  She has decided to proceed with bilateral total mastectomies.  She does have a loop recorder in her left breast which will be included in the left mastectomy.  Procedure: The patient was brought to operating identifies a correct patient.  She is placed upon the operating table general anesthesia was induced.  Her bilateral breasts were then prepped and draped in usual sterile fashion.  I created matching elliptical incisions on both breasts transversely with a scalpel incorporating the nipple areolar complexes.  I also included the patient's previous scar on the left breast in the left incision.  I then created bilateral superior skin flaps with electrocautery going toward the clavicle and then down to the chest wall.  During this time identified the loop recorder medially and the left breast.  The loop recorder was included in the mastectomy of the left side.  I then created the inferior skin flaps going down to the inframammary ridge staying again just underneath the dermis with the cautery.  I then slowly dissected both breasts off the chest wall dissecting medial to lateral with electrocautery on the left side working toward the axilla and then again with electrocautery on the right side removing the breast from medial to lateral toward the axilla.  I marked each specimen laterally with a silk suture.  Both the right  and left mastectomy specimens were sent to pathology for evaluation.  We irrigated both incisions with saline.  Hemostasis was achieved with the cautery.  We made bilateral skin incisions and then placed a 19 [place] Blake drain through these and into the side of the mastectomies bilaterally.  Each drain was sewn in place with a nylon suture.  Both incisions were then closed with interrupted 3-0 Vicryl sutures and running 4-0 Monocryl sutures.  Steri-Strips were then applied.  The drains were placed to bulb suction.  The patient was then placed in the breast binder.  She tolerated the procedure well.  All the counts were correct at the end of the procedure.  She was then extubated in the operating room and taken in a stable condition to the recovery room.          Coralie Keens   Date: 04/07/2021  Time: 1:41 PM

## 2021-04-07 NOTE — Interval H&P Note (Signed)
History and Physical Interval Note:  no change in H and P  04/07/2021 10:52 AM  Tammy Cooper  has presented today for surgery, with the diagnosis of LEFT BREAST CANCER.  The various methods of treatment have been discussed with the patient and family. After consideration of risks, benefits and other options for treatment, the patient has consented to  Procedure(s): BILATERAL TOTAL MASTECTOMY (Bilateral) as a surgical intervention.  The patient's history has been reviewed, patient examined, no change in status, stable for surgery.  I have reviewed the patient's chart and labs.  Questions were answered to the patient's satisfaction.     Coralie Keens

## 2021-04-07 NOTE — Progress Notes (Signed)

## 2021-04-07 NOTE — Anesthesia Preprocedure Evaluation (Signed)
Anesthesia Evaluation  Patient identified by MRN, date of birth, ID band Patient awake    Reviewed: Allergy & Precautions, NPO status , Patient's Chart, lab work & pertinent test results, reviewed documented beta blocker date and time   History of Anesthesia Complications (+) PONVNegative for: history of anesthetic complications  Airway Mallampati: II  TM Distance: <3 FB Neck ROM: Full    Dental  (+) Teeth Intact, Dental Advisory Given   Pulmonary neg pulmonary ROS,    breath sounds clear to auscultation       Cardiovascular hypertension, Pt. on medications (-) angina(-) Past MI and (-) CHF  Rhythm:Regular     Neuro/Psych PSYCHIATRIC DISORDERS Anxiety Depression TIA   GI/Hepatic negative GI ROS,   Endo/Other    Renal/GU negative Renal ROS     Musculoskeletal  (+) Arthritis , Osteoarthritis,    Abdominal (+) + obese,   Peds  Hematology negative hematology ROS (+)   Anesthesia Other Findings Breast Cancer  Reproductive/Obstetrics                             Lab Results  Component Value Date   WBC 11.9 (H) 12/02/2015   HGB 11.3 (L) 12/02/2015   HCT 34.3 (L) 12/02/2015   MCV 89.8 12/02/2015   PLT 255 12/02/2015   Lab Results  Component Value Date   CREATININE 0.65 12/08/2020   BUN 15 12/08/2020   NA 142 12/08/2020   K 4.5 12/08/2020   CL 102 12/08/2020   CO2 21 12/08/2020   Lab Results  Component Value Date   INR 1.00 07/17/2015    Anesthesia Physical  Anesthesia Plan  ASA: III  Anesthesia Plan: General   Post-op Pain Management:    Induction: Intravenous  PONV Risk Score and Plan: 4 or greater and Ondansetron, Dexamethasone, Midazolam, Droperidol and Treatment may vary due to age or medical condition  Airway Management Planned: LMA  Additional Equipment:   Intra-op Plan:   Post-operative Plan: Extubation in OR  Informed Consent: I have reviewed the patients  History and Physical, chart, labs and discussed the procedure including the risks, benefits and alternatives for the proposed anesthesia with the patient or authorized representative who has indicated his/her understanding and acceptance.       Plan Discussed with: CRNA and Surgeon  Anesthesia Plan Comments:         Anesthesia Quick Evaluation

## 2021-04-08 ENCOUNTER — Encounter (HOSPITAL_BASED_OUTPATIENT_CLINIC_OR_DEPARTMENT_OTHER): Payer: Self-pay | Admitting: Surgery

## 2021-04-08 DIAGNOSIS — C50412 Malignant neoplasm of upper-outer quadrant of left female breast: Secondary | ICD-10-CM | POA: Diagnosis not present

## 2021-04-08 MED ORDER — TRAMADOL HCL 50 MG PO TABS: 50.0000 mg | ORAL_TABLET | Freq: Four times a day (QID) | ORAL | 0 refills | Status: DC | PRN

## 2021-04-08 NOTE — Discharge Instructions (Addendum)
[facility] [phone]  MASTECTOMY: POST OP INSTRUCTIONS  Always review your discharge instruction sheet given to you by the facility where your surgery was performed. IF YOU HAVE DISABILITY OR FAMILY LEAVE FORMS, YOU MUST BRING THEM TO THE OFFICE FOR PROCESSING.   DO NOT GIVE THEM TO YOUR DOCTOR. A prescription for pain medication may be given to you upon discharge.  Take your pain medication as prescribed, if needed.  If narcotic pain medicine is not needed, then you may take acetaminophen (Tylenol) or ibuprofen (Advil) as needed. Take your usually prescribed medications unless otherwise directed. If you need a refill on your pain medication, please contact your pharmacy.  They will contact our office to request authorization.  Prescriptions will not be filled after 5pm or on week-ends. You should follow a light diet the first few days after arrival home, such as soup and crackers, etc.  Resume your normal diet the day after surgery. Most patients will experience some swelling and bruising on the chest and underarm.  Ice packs will help.  Swelling and bruising can take several days to resolve.  It is common to experience some constipation if taking pain medication after surgery.  Increasing fluid intake and taking a stool softener (such as Colace) will usually help or prevent this problem from occurring.  A mild laxative (Milk of Magnesia or Miralax) should be taken according to package instructions if there are no bowel movements after 48 hours. Unless discharge instructions indicate otherwise, leave your bandage dry and in place until your next appointment in 3-5 days.  You may take a limited sponge bath.  No tube baths or showers until the drains are removed.  You may have steri-strips (small skin tapes) in place directly over the incision.  These strips should be left on the skin for 7-10 days.  If your surgeon used skin glue on the incision, you may shower in 24 hours.   The glue will flake off over the next 2-3 weeks.  Any sutures or staples will be removed at the office during your follow-up visit. DRAINS:  If you have drains in place, it is important to keep a list of the amount of drainage produced each day in your drains.  Before leaving the hospital, you should be instructed on drain care.  Call our office if you have any questions about your drains. ACTIVITIES:  You may resume regular (light) daily activities beginning the next day--such as daily self-care, walking, climbing stairs--gradually increasing activities as tolerated.  You may have sexual intercourse when it is comfortable.  Refrain from any heavy lifting or straining until approved by your doctor. You may drive when you are no longer taking prescription pain medication, you can comfortably wear a seatbelt, and you can safely maneuver your car and apply brakes. RETURN TO WORK:  __________________________________________________________ You should see your doctor in the office for a follow-up appointment approximately 3-5 days after your surgery.  Your doctor's nurse will typically make your follow-up appointment when she calls you with your pathology report.  Expect your pathology report 2-3 business days after your surgery.  You may call to check if you do not hear from us after three days.   OTHER INSTRUCTIONS: ______________________________________________________________________________________________ ____________________________________________________________________________________________ WHEN TO CALL YOUR DOCTOR: Fever over 101.0 Nausea and/or vomiting Extreme swelling or bruising Continued bleeding from incision. Increased pain, redness, or drainage from the incision. The clinic staff is available to answer your questions during regular business hours.  Please don't hesitate   to call and ask to speak to one of the nurses for clinical concerns.  If you have a medical emergency, go to the  nearest emergency room or call 911.  A surgeon from [facility] is always on call at the hospital. [address], [address], [place], [place]  [address] ? [address] [address], [place], [place]   [address] [phone] ? [phone] ? FAX [phone] Web site: [email or web address]   About my Jackson-Pratt Bulb Drain  What is a Jackson-Pratt bulb? A Jackson-Pratt is a soft, round device used to collect drainage. It is connected to a long, thin drainage catheter, which is held in place by one or two small stiches near your surgical incision site. When the bulb is squeezed, it forms a vacuum, forcing the drainage to empty into the bulb.  Emptying the Jackson-Pratt bulb- To empty the bulb: 1. Release the plug on the top of the bulb. 2. Pour the bulb's contents into a measuring container which your nurse will provide. 3. Record the time emptied and amount of drainage. Empty the drain(s) as often as your     doctor or nurse recommends.  Date                  Time                    Amount (Drain 1)                 Amount (Drain 2)  _____________________________________________________________________  _____________________________________________________________________  _____________________________________________________________________  _____________________________________________________________________  _____________________________________________________________________  _____________________________________________________________________  _____________________________________________________________________  _____________________________________________________________________  Squeezing the Jackson-Pratt Bulb- To squeeze the bulb: 1. Make sure the plug at the top of the bulb is open. 2. Squeeze the bulb tightly in your fist. You will hear air squeezing from the bulb. 3. Replace the plug while the bulb is squeezed. 4. Use a safety pin to attach the bulb to your clothing.  This will keep the catheter from     pulling at the bulb insertion site.  When to call your doctor- Call your doctor if: Drain site becomes red, swollen or hot. You have a fever greater than 101 degrees F. There is oozing at the drain site. Drain falls out (apply a guaze bandage over the drain hole and secure it with tape). Drainage increases daily not related to activity patterns. (You will usually have more drainage when you are active than when you are resting.) Drainage has a bad odor.  

## 2021-04-08 NOTE — Discharge Summary (Signed)
Physician Discharge Summary  Patient ID: Tammy Cooper MRN: [id number] DOB/AGE: 01/21/1951 70 y.o.  Admit date: 04/07/2021 Discharge date: 04/08/2021  Admission Diagnoses:  Discharge Diagnoses:  Principal Problem:   S/P bilateral mastectomy   Discharged Condition: good  Hospital Course: uneventful post op recovery.  Discharged home POD#1  Consults: None  Significant Diagnostic Studies:   Treatments: surgery: bilateral total mastectomy  Discharge Exam: Blood pressure (!) 125/52, pulse 63, temperature 98 F (36.7 C), resp. rate 16, height 5\' 3"  (1.6 m), weight 91 kg, SpO2 97 %. General appearance: alert, cooperative, and no distress Resp: clear to auscultation bilaterally Cardio: regular rate and rhythm, S1, S2 normal, no murmur, click, rub or gallop Binder in place, flaps viable, drains sersang  Disposition: Discharge disposition: 01-Home or Self Care       Discharge Instructions     Diet - low sodium heart healthy   Complete by: As directed    Increase activity slowly   Complete by: As directed       Allergies as of 04/08/2021       Reactions   Lipitor [atorvastatin]    Muscle aches   Other Hives   PATIENT ALLERGIC TO SURGICAL GLUE        Medication List     TAKE these medications    ALPRAZolam 0.25 MG tablet Commonly known as: XANAX Take 1 tablet (0.25 mg total) by mouth at bedtime as needed for anxiety.   aspirin EC 81 MG tablet Take 1 tablet (81 mg total) by mouth daily.   cholecalciferol 25 MCG (1000 UNIT) tablet Commonly known as: VITAMIN D3 Take 1,000 Units by mouth daily.   Co-Enzyme Q10 200 MG Caps Take 300 mg by mouth daily.   ezetimibe 10 MG tablet Commonly known as: ZETIA Take 1 tablet (10 mg total) by mouth daily.   letrozole 2.5 MG tablet Commonly known as: FEMARA Take 1 tablet (2.5 mg total) by mouth daily.   multivitamin with minerals Tabs tablet Take 1 tablet by mouth daily.   rosuvastatin 10 MG  tablet Commonly known as: CRESTOR Take 1 tablet (10 mg total) by mouth daily.   telmisartan 40 MG tablet Commonly known as: MICARDIS Take 1 tablet (40 mg total) by mouth daily.   traMADol 50 MG tablet Commonly known as: ULTRAM Take 1 tablet (50 mg total) by mouth every 6 (six) hours as needed.   zinc gluconate 50 MG tablet Take 50 mg by mouth daily.        Follow-up Information     Coralie Keens, MD Follow up on 04/28/2021.   Specialty: General Surgery Contact information: [address] [place] [address] [phone]                 Signed: Coralie Keens 04/08/2021, 6:58 AM

## 2021-04-09 ENCOUNTER — Encounter: Payer: Self-pay | Admitting: Cardiology

## 2021-04-12 ENCOUNTER — Encounter: Payer: Self-pay | Admitting: *Deleted

## 2021-04-12 DIAGNOSIS — Z9011 Acquired absence of right breast and nipple: Secondary | ICD-10-CM | POA: Diagnosis not present

## 2021-04-12 DIAGNOSIS — C50912 Malignant neoplasm of unspecified site of left female breast: Secondary | ICD-10-CM | POA: Diagnosis not present

## 2021-04-13 ENCOUNTER — Encounter: Payer: Self-pay | Admitting: *Deleted

## 2021-04-13 LAB — SURGICAL PATHOLOGY

## 2021-04-28 NOTE — Progress Notes (Signed)
Patient Care Team: Donald Prose, MD as PCP - General (Family Medicine) Sueanne Margarita, MD as PCP - Cardiology (Cardiology) Vickie Epley, MD as PCP - Electrophysiology (Cardiology) Sueanne Margarita, MD as Consulting Physician (Cardiology) Rockwell Germany, RN as Oncology Nurse Navigator Mauro Kaufmann, RN as Oncology Nurse Navigator  DIAGNOSIS:    ICD-10-CM   1. Malignant neoplasm of upper-outer quadrant of left breast in female, estrogen receptor negative ([place])  C50.412    Z17.1       SUMMARY OF ONCOLOGIC HISTORY: Oncology History  Malignant neoplasm of upper-outer quadrant of left breast in female, estrogen receptor negative ([place])  05/22/2014 Breast MRI   Left breast 12:00 position irregular nodular enhancement on 0.6 x 1.2 x 0.8 cm, no abnormal lymph nodes   07/31/2014 Surgery   Left breast lumpectomy: DCIS with calcifications 1 cm size ER 99%, PR 99%   09/29/2014 - 10/31/2014 Radiation Therapy   Adjuvant radiation   11/18/2014 -  Anti-estrogen oral therapy   anastrozole 1 mg daily 5 years   03/11/2021 Relapse/Recurrence   Patient felt a lump and there were 2.5 cm of suspicious calcifications which on biopsy came back as microscopic focus of IDC with DCIS   04/07/2021 Surgery   Bilateral mastectomies Right mastectomy: Fibrocystic changes Left mastectomy: 3 mm residual focus of grade 2 IDC and multiple foci of high-grade DCIS, margins negative, ER 0%, PR 0%, HER2 not performed, Ki-67 less than 1%     CHIEF COMPLIANT: Follow-up of left breast cancer on anastrozole therapy  INTERVAL HISTORY: Tammy Cooper is a 71 y.o. with above-mentioned history of left breast cancer treated with lumpectomy, radiation, and who is currently on antiestrogen therapy with anastrozole. She presents to the clinic today for follow-up.   ALLERGIES:  is allergic to lipitor [atorvastatin] and other.  MEDICATIONS:  Current Outpatient Medications  Medication Sig Dispense Refill    ALPRAZolam (XANAX) 0.25 MG tablet Take 1 tablet (0.25 mg total) by mouth at bedtime as needed for anxiety. 30 tablet 0   aspirin EC 81 MG tablet Take 1 tablet (81 mg total) by mouth daily. 90 tablet 3   cholecalciferol (VITAMIN D3) 25 MCG (1000 UNIT) tablet Take 1,000 Units by mouth daily.     Co-Enzyme Q10 200 MG CAPS Take 300 mg by mouth daily. 90 capsule 3   ezetimibe (ZETIA) 10 MG tablet Take 1 tablet (10 mg total) by mouth daily. 90 tablet 3   letrozole (FEMARA) 2.5 MG tablet Take 1 tablet (2.5 mg total) by mouth daily. 30 tablet 0   Multiple Vitamin (MULTIVITAMIN WITH MINERALS) TABS tablet Take 1 tablet by mouth daily.     rosuvastatin (CRESTOR) 10 MG tablet Take 1 tablet (10 mg total) by mouth daily. 90 tablet 3   telmisartan (MICARDIS) 40 MG tablet Take 1 tablet (40 mg total) by mouth daily. 90 tablet 3   traMADol (ULTRAM) 50 MG tablet Take 1 tablet (50 mg total) by mouth every 6 (six) hours as needed. 25 tablet 0   zinc gluconate 50 MG tablet Take 50 mg by mouth daily.     No current facility-administered medications for this visit.    PHYSICAL EXAMINATION: ECOG PERFORMANCE STATUS: 1 - Symptomatic but completely ambulatory  Vitals:   04/29/21 1029  BP: (!) 158/69  Pulse: 64  Resp: 18  Temp: 97.7 F (36.5 C)  SpO2: 99%   Filed Weights   04/29/21 1029  Weight: 201 lb 1.6 oz (91.2 kg)  BREAST: No palpable masses or nodules in either right or left breasts. No palpable axillary supraclavicular or infraclavicular adenopathy no breast tenderness or nipple discharge. (exam performed in the presence of a chaperone)  LABORATORY DATA:  I have reviewed the data as listed CMP Latest Ref Rng & Units 12/08/2020 10/23/2020 12/12/2018  Glucose 65 - 99 mg/dL 93 - 112(H)  BUN 8 - 27 mg/dL 15 - 16  Creatinine 0.57 - 1.00 mg/dL 0.65 - 0.76  Sodium 134 - 144 mmol/L 142 - 141  Potassium 3.5 - 5.2 mmol/L 4.5 - 4.2  Chloride 96 - 106 mmol/L 102 - 101  CO2 20 - 29 mmol/L 21 - 21  Calcium 8.7  - 10.3 mg/dL 9.6 - 9.8  Total Protein 6.0 - 8.5 g/dL - - -  Total Bilirubin 0.0 - 1.2 mg/dL - - -  Alkaline Phos 39 - 117 IU/L - - -  AST 0 - 40 IU/L - - -  ALT 0 - 32 IU/L - 20 25    Lab Results  Component Value Date   WBC 11.9 (H) 12/02/2015   HGB 11.3 (L) 12/02/2015   HCT 34.3 (L) 12/02/2015   MCV 89.8 12/02/2015   PLT 255 12/02/2015   NEUTROABS 3.8 12/07/2006    ASSESSMENT & PLAN:  Malignant neoplasm of upper-outer quadrant of left breast in female, estrogen receptor negative ([place]) 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 treatment: Adjuvant anastrozole 1 mg daily ( chosen because of history of TIA) since 11/18/2014-2021    Relapse/recurrence:Patient felt a lump and there were 2.5 cm of suspicious calcifications which on biopsy came back as microscopic focus of IDC with DCIS   04/07/2021: Bilateral mastectomies Right mastectomy: Fibrocystic changes Left mastectomy: 3 mm residual focus of grade 2 IDC and multiple foci of high-grade DCIS, margins negative, ER 0%, PR 0%, HER2 not performed, Ki-67 less than 1%  Pathology counseling: I discussed the final pathology report of the patient provided  a copy of this report. I discussed the margins as well as lymph node surgeries. We also discussed the final staging along with previously performed ER/PR and HER-2/neu testing.  Treatment plan: No role of radiation or chemotherapy or antiestrogen therapy. Return to clinic in 3 months for survivorship care plan visit.  After that she can be followed by long-term survivorship     No orders of the defined types were placed in this encounter.  The patient has a good understanding of the overall plan. she agrees with it. she will call with any problems that may develop before the next visit here.  Total time spent: 20 mins including face to face time and time spent for planning, charting and coordination of care  Rulon Eisenmenger,  MD, MPH 04/29/2021  I, Thana Ates, am acting as scribe for Dr. Nicholas Lose.  I have reviewed the above documentation for accuracy and completeness, and I agree with the above.

## 2021-04-29 ENCOUNTER — Other Ambulatory Visit: Payer: Self-pay

## 2021-04-29 ENCOUNTER — Inpatient Hospital Stay: Payer: Medicare PPO | Attending: Hematology and Oncology | Admitting: Hematology and Oncology

## 2021-04-29 DIAGNOSIS — Z9013 Acquired absence of bilateral breasts and nipples: Secondary | ICD-10-CM | POA: Insufficient documentation

## 2021-04-29 DIAGNOSIS — Z171 Estrogen receptor negative status [ER-]: Secondary | ICD-10-CM

## 2021-04-29 DIAGNOSIS — C50412 Malignant neoplasm of upper-outer quadrant of left female breast: Secondary | ICD-10-CM

## 2021-04-29 DIAGNOSIS — Z923 Personal history of irradiation: Secondary | ICD-10-CM | POA: Diagnosis not present

## 2021-04-29 DIAGNOSIS — Z853 Personal history of malignant neoplasm of breast: Secondary | ICD-10-CM | POA: Insufficient documentation

## 2021-04-29 NOTE — Assessment & Plan Note (Addendum)
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 treatment: Adjuvant anastrozole 1 mg daily ( chosen because of history of TIA) since 11/18/2014-2021   Relapse/recurrence:Patient felt a lump and there were 2.5 cm of suspicious calcifications which on biopsy came back as microscopic focus of IDC with DCIS  04/07/2021: Bilateral mastectomies Right mastectomy: Fibrocystic changes Left mastectomy: 3 mm residual focus of grade 2 IDC and multiple foci of high-grade DCIS, margins negative, ER 0%, PR 0%, HER2 not performed, Ki-67 less than 1%  Pathology counseling: I discussed the final pathology report of the patient provided  a copy of this report. I discussed the margins as well as lymph node surgeries. We also discussed the final staging along with previously performed ER/PR and HER-2/neu testing.  Treatment plan: No role of radiation or chemotherapy or antiestrogen therapy. Return to clinic in 3 months for survivorship care plan visit.  After that she can be followed by long-term survivorship

## 2021-04-30 ENCOUNTER — Encounter: Payer: Self-pay | Admitting: *Deleted

## 2021-04-30 DIAGNOSIS — Z171 Estrogen receptor negative status [ER-]: Secondary | ICD-10-CM

## 2021-04-30 DIAGNOSIS — C50412 Malignant neoplasm of upper-outer quadrant of left female breast: Secondary | ICD-10-CM

## 2021-05-10 DIAGNOSIS — C50912 Malignant neoplasm of unspecified site of left female breast: Secondary | ICD-10-CM | POA: Diagnosis not present

## 2021-05-10 DIAGNOSIS — Z9011 Acquired absence of right breast and nipple: Secondary | ICD-10-CM | POA: Diagnosis not present

## 2021-05-14 DIAGNOSIS — C50912 Malignant neoplasm of unspecified site of left female breast: Secondary | ICD-10-CM | POA: Diagnosis not present

## 2021-05-14 DIAGNOSIS — Z9011 Acquired absence of right breast and nipple: Secondary | ICD-10-CM | POA: Diagnosis not present

## 2021-06-10 DIAGNOSIS — L821 Other seborrheic keratosis: Secondary | ICD-10-CM | POA: Diagnosis not present

## 2021-06-10 DIAGNOSIS — L570 Actinic keratosis: Secondary | ICD-10-CM | POA: Diagnosis not present

## 2021-06-10 DIAGNOSIS — R310 Gross hematuria: Secondary | ICD-10-CM | POA: Diagnosis not present

## 2021-06-10 DIAGNOSIS — L309 Dermatitis, unspecified: Secondary | ICD-10-CM | POA: Diagnosis not present

## 2021-06-10 DIAGNOSIS — D485 Neoplasm of uncertain behavior of skin: Secondary | ICD-10-CM | POA: Diagnosis not present

## 2021-06-18 DIAGNOSIS — N3021 Other chronic cystitis with hematuria: Secondary | ICD-10-CM | POA: Diagnosis not present

## 2021-07-26 ENCOUNTER — Telehealth: Payer: Self-pay | Admitting: *Deleted

## 2021-07-28 ENCOUNTER — Inpatient Hospital Stay: Payer: Medicare PPO | Attending: Hematology and Oncology | Admitting: Adult Health

## 2021-07-28 ENCOUNTER — Encounter: Payer: Self-pay | Admitting: Adult Health

## 2021-07-28 ENCOUNTER — Other Ambulatory Visit: Payer: Self-pay

## 2021-07-28 VITALS — BP 139/42 | HR 66 | Temp 97.7°F | Resp 16 | Ht 63.0 in | Wt 204.6 lb

## 2021-07-28 DIAGNOSIS — C50412 Malignant neoplasm of upper-outer quadrant of left female breast: Secondary | ICD-10-CM

## 2021-07-28 DIAGNOSIS — Z79899 Other long term (current) drug therapy: Secondary | ICD-10-CM | POA: Diagnosis not present

## 2021-07-28 DIAGNOSIS — Z171 Estrogen receptor negative status [ER-]: Secondary | ICD-10-CM | POA: Diagnosis not present

## 2021-07-28 DIAGNOSIS — Z853 Personal history of malignant neoplasm of breast: Secondary | ICD-10-CM | POA: Diagnosis not present

## 2021-07-28 DIAGNOSIS — Z9013 Acquired absence of bilateral breasts and nipples: Secondary | ICD-10-CM | POA: Diagnosis not present

## 2021-07-28 NOTE — Progress Notes (Signed)
SURVIVORSHIP VISIT: ?  ? ?BRIEF ONCOLOGIC HISTORY:  ?Oncology History  ?Malignant neoplasm of upper-outer quadrant of left breast in female, estrogen receptor negative ([place])  ?05/22/2014 Breast MRI  ? Left breast 12:00 position irregular nodular enhancement on 0.6 x 1.2 x 0.8 cm, no abnormal lymph nodes ?  ?07/31/2014 Surgery  ? Left breast lumpectomy: DCIS with calcifications 1 cm size ER 99%, PR 99% ?  ?09/29/2014 - 10/31/2014 Radiation Therapy  ? Adjuvant radiation ?  ?11/18/2014 -  Anti-estrogen oral therapy  ? anastrozole 1 mg daily ?5 years ?  ?03/11/2021 Relapse/Recurrence  ? Patient felt a lump and there were 2.5 cm of suspicious calcifications which on biopsy came back as microscopic focus of IDC with DCIS ?  ?04/07/2021 Surgery  ? Bilateral mastectomies ?Right mastectomy: Fibrocystic changes ?Left mastectomy: 3 mm residual focus of grade 2 IDC and multiple foci of high-grade DCIS, margins negative, ER 0%, PR 0%, HER2 not performed, Ki-67 less than 1% ?  ? ? ?INTERVAL HISTORY:  ?Ms. Hamilton-Smith to review her survivorship care plan detailing her treatment course for breast cancer, as well as monitoring long-term side effects of that treatment, education regarding health maintenance, screening, and overall wellness and health promotion.    ? ?Overall, Ms. Hamilton-Smith reports feeling quite well.  She is healed well since her bilateral mastectomies and denies any significant issues with her recovery.  She notes that she opted to forego reconstruction due to having previously radiated skin and the need for more complex surgery she was not interested in.  ? ?REVIEW OF SYSTEMS:  ?Review of Systems  ?Constitutional:  Negative for appetite change, chills, fatigue, fever and unexpected weight change.  ?HENT:   Negative for hearing loss, lump/mass and trouble swallowing.   ?Eyes:  Negative for eye problems and icterus.  ?Respiratory:  Negative for chest tightness, cough and shortness of breath.   ?Cardiovascular:   Negative for chest pain, leg swelling and palpitations.  ?Gastrointestinal:  Negative for abdominal distention, abdominal pain, constipation, diarrhea, nausea and vomiting.  ?Endocrine: Negative for hot flashes.  ?Genitourinary:  Negative for difficulty urinating.   ?Musculoskeletal:  Negative for arthralgias.  ?Skin:  Negative for itching and rash.  ?Neurological:  Negative for dizziness, extremity weakness, headaches and numbness.  ?Hematological:  Negative for adenopathy. Does not bruise/bleed easily.  ?Psychiatric/Behavioral:  Negative for depression. The patient is not nervous/anxious.   ? ? ? ? ?ONCOLOGY TREATMENT TEAM:  ?1. Surgeon:  Dr. Ninfa Linden at [facility] ?2. Medical Oncologist: Dr. Lindi Adie  ? ?  ? ?PAST MEDICAL/SURGICAL HISTORY:  ?Past Medical History:  ?Diagnosis Date  ? Anxiety   ? Arthritis   ? Breast cancer ([place]) 05/28/2014  ? Left breast in situ   ? Carotid stenosis, asymptomatic, right   ? 1-39% right ICA stenosis and left subclavian stenosis  ? Complication of anesthesia   ? Constipation   ? occasiopnal  ? Depression   ? History of breast cancer 04/25/2014  ? History of recurrent cystitis   ? Hypercholesteremia   ? Hypertension   ? PONV (postoperative nausea and vomiting)   ? "a little nausea"  ? Spinal stenosis   ? Stroke [facility])   ? H/O TIA in 2006, MRA clear,arteriogram was done which was normal  ? ?Past Surgical History:  ?Procedure Laterality Date  ? anal skin tag removal  10/21/10  ? BACK SURGERY  07/12/2010  ? stenosis L4 + L5  ? BLADDER TACK    ? WITH THE  HYSTERECTOMY  ? BREAST BIOPSY Left 06/13/2014  ? Benign  ? BREAST BIOPSY Left 05/28/2014  ? High Risk  ? BREAST LUMPECTOMY Left 07/31/2014  ? BREAST LUMPECTOMY WITH RADIOACTIVE SEED LOCALIZATION Left 07/31/2014  ? Procedure: LEFT BREAST LUMPECTOMY WITH RADIOACTIVE SEED LOCALIZATION;  Surgeon: Coralie Keens, MD;  Location: [facility];  Service: General;  Laterality: Left;  ? COLONOSCOPY    ? HEMORROIDECTOMY    ?  KNEE ARTHROSCOPY  2010  ? left  ? ROTATOR CUFF REPAIR  2005  ? rt shoulder  ? TONSILLECTOMY    ? TOTAL KNEE ARTHROPLASTY Right 08/04/2015  ? Procedure: RIGHT TOTAL KNEE ARTHROPLASTY;  Surgeon: Paralee Cancel, MD;  Location: [facility];  Service: Orthopedics;  Laterality: Right;  ? TOTAL KNEE ARTHROPLASTY Left 12/01/2015  ? Procedure: LEFT TOTAL KNEE ARTHROPLASTY;  Surgeon: Paralee Cancel, MD;  Location: [facility];  Service: Orthopedics;  Laterality: Left;  ? TOTAL MASTECTOMY Bilateral 04/07/2021  ? Procedure: BILATERAL TOTAL MASTECTOMY;  Surgeon: Coralie Keens, MD;  Location: [facility];  Service: General;  Laterality: Bilateral;  ? VAGINAL HYSTERECTOMY  2003  ? total hyst-BSO-Ant/post repair  ? ? ? ?ALLERGIES:  ?Allergies  ?Allergen Reactions  ? Lipitor [Atorvastatin]   ?  Muscle aches  ? Other Hives  ?  PATIENT ALLERGIC TO SURGICAL GLUE  ? ? ? ?CURRENT MEDICATIONS:  ?Outpatient Encounter Medications as of 07/28/2021  ?Medication Sig  ? ALPRAZolam (XANAX) 0.25 MG tablet Take 1 tablet (0.25 mg total) by mouth at bedtime as needed for anxiety.  ? aspirin EC 81 MG tablet Take 1 tablet (81 mg total) by mouth daily.  ? cholecalciferol (VITAMIN D3) 25 MCG (1000 UNIT) tablet Take 1,000 Units by mouth daily.  ? Co-Enzyme Q10 200 MG CAPS Take 300 mg by mouth daily.  ? ezetimibe (ZETIA) 10 MG tablet Take 1 tablet (10 mg total) by mouth daily.  ? letrozole (FEMARA) 2.5 MG tablet Take 1 tablet (2.5 mg total) by mouth daily.  ? Multiple Vitamin (MULTIVITAMIN WITH MINERALS) TABS tablet Take 1 tablet by mouth daily.  ? rosuvastatin (CRESTOR) 10 MG tablet Take 1 tablet (10 mg total) by mouth daily.  ? telmisartan (MICARDIS) 40 MG tablet Take 1 tablet (40 mg total) by mouth daily.  ? traMADol (ULTRAM) 50 MG tablet Take 1 tablet (50 mg total) by mouth every 6 (six) hours as needed.  ? zinc gluconate 50 MG tablet Take 50 mg by mouth daily.  ? ?No facility-administered encounter medications on file as of 07/28/2021.  ? ? ? ?ONCOLOGIC  FAMILY HISTORY:  ?Family History  ?Problem Relation Age of Onset  ? Cancer Father   ?     lung  ? Hypertension Mother   ? CAD Brother   ?     s/p PCI  ? Hypertension Sister   ? Heart attack Neg Hx   ? ? ? ?GENETIC COUNSELING/TESTING: ?Not at this time ? ?SOCIAL HISTORY:  ?Social History  ? ?Socioeconomic History  ? Marital status: Married  ?  Spouse name: Not on file  ? Number of children: Not on file  ? Years of education: Not on file  ? Highest education level: Not on file  ?Occupational History  ? Not on file  ?Tobacco Use  ? Smoking status: Never  ? Smokeless tobacco: Never  ?Vaping Use  ? Vaping Use: Never used  ?Substance and Sexual Activity  ? Alcohol use: Yes  ?  Comment: ocassionally  ? Drug use:  No  ? Sexual activity: Yes  ?Other Topics Concern  ? Not on file  ?Social History Narrative  ? Not on file  ? ?Social Determinants of Health  ? ?Financial Resource Strain: Not on file  ?Food Insecurity: Not on file  ?Transportation Needs: Not on file  ?Physical Activity: Not on file  ?Stress: Not on file  ?Social Connections: Not on file  ?Intimate Partner Violence: Not on file  ? ? ? ?OBSERVATIONS/OBJECTIVE:  ?BP (!) 139/42 (BP Location: Left Arm, Patient Position: Sitting)   Pulse 66   Temp 97.7 ?F (36.5 ?C) (Temporal)   Resp 16   Ht 5' 3"  (1.6 m)   Wt 204 lb 9.6 oz (92.8 kg)   SpO2 98%   BMI 36.24 kg/m?  ?GENERAL: Patient is a well appearing female in no acute distress ?HEENT:  Sclerae anicteric.  Oropharynx clear and moist. No ulcerations or evidence of oropharyngeal candidiasis. Neck is supple.  ?NODES:  No cervical, supraclavicular, or axillary lymphadenopathy palpated.  ?BREAST EXAM: Status post bilateral mastectomies no sign of local recurrence ?LUNGS:  Clear to auscultation bilaterally.  No wheezes or rhonchi. ?HEART:  Regular rate and rhythm. No murmur appreciated. ?ABDOMEN:  Soft, nontender.  Positive, normoactive bowel sounds. No organomegaly palpated. ?MSK:  No focal spinal tenderness to  palpation. Full range of motion bilaterally in the upper extremities. ?EXTREMITIES:  No peripheral edema.   ?SKIN:  Clear with no obvious rashes or skin changes. No nail dyscrasia. ?NEURO:  Nonfocal. Well oriented.  App

## 2021-10-18 DIAGNOSIS — H34231 Retinal artery branch occlusion, right eye: Secondary | ICD-10-CM | POA: Diagnosis not present

## 2021-11-18 DIAGNOSIS — Z09 Encounter for follow-up examination after completed treatment for conditions other than malignant neoplasm: Secondary | ICD-10-CM | POA: Diagnosis not present

## 2021-11-18 DIAGNOSIS — K648 Other hemorrhoids: Secondary | ICD-10-CM | POA: Diagnosis not present

## 2021-11-18 DIAGNOSIS — Z86010 Personal history of colonic polyps: Secondary | ICD-10-CM | POA: Diagnosis not present

## 2021-12-15 DIAGNOSIS — N281 Cyst of kidney, acquired: Secondary | ICD-10-CM | POA: Diagnosis not present

## 2021-12-15 DIAGNOSIS — N3021 Other chronic cystitis with hematuria: Secondary | ICD-10-CM | POA: Diagnosis not present

## 2021-12-16 DIAGNOSIS — L03011 Cellulitis of right finger: Secondary | ICD-10-CM | POA: Diagnosis not present

## 2021-12-20 ENCOUNTER — Other Ambulatory Visit: Payer: Self-pay | Admitting: Cardiology

## 2022-01-17 ENCOUNTER — Encounter: Payer: Self-pay | Admitting: *Deleted

## 2022-01-17 ENCOUNTER — Telehealth: Payer: Self-pay | Admitting: Cardiology

## 2022-01-17 NOTE — Telephone Encounter (Signed)
Patient is requesting orders to have lab work completed on the same day as 05/18/22 follow-up with Dr. Radford Pax.

## 2022-01-20 ENCOUNTER — Other Ambulatory Visit: Payer: Self-pay | Admitting: Cardiology

## 2022-01-20 MED ORDER — ROSUVASTATIN CALCIUM 10 MG PO TABS: 10.0000 mg | ORAL_TABLET | Freq: Every day | ORAL | 3 refills | Status: DC

## 2022-01-25 NOTE — Progress Notes (Signed)
Patient Care Team: Tammy Prose, MD as PCP - General (Family Medicine) Tammy Margarita, MD as PCP - Cardiology (Cardiology) Tammy Epley, MD as PCP - Electrophysiology (Cardiology) Tammy Margarita, MD as Consulting Physician (Cardiology) Tammy Lose, MD as Consulting Physician (Hematology and Oncology) Tammy Keens, MD as Consulting Physician (General Surgery)  DIAGNOSIS:  Encounter Diagnosis  Name Primary?   Malignant neoplasm of upper-outer quadrant of left breast in female, estrogen receptor negative ([place])     SUMMARY OF ONCOLOGIC HISTORY: Oncology History  Malignant neoplasm of upper-outer quadrant of left breast in female, estrogen receptor negative ([place])  05/22/2014 Breast MRI   Left breast 12:00 position irregular nodular enhancement on 0.6 x 1.2 x 0.8 cm, no abnormal lymph nodes   07/31/2014 Surgery   Left breast lumpectomy: DCIS with calcifications 1 cm size ER 99%, PR 99%   09/29/2014 - 10/31/2014 Radiation Therapy   Adjuvant radiation   11/18/2014 -  Anti-estrogen oral therapy   anastrozole 1 mg daily 5 years   03/11/2021 Relapse/Recurrence   Patient felt a lump and there were 2.5 cm of suspicious calcifications which on biopsy came back as microscopic focus of IDC with DCIS   04/07/2021 Surgery   Bilateral mastectomies Right mastectomy: Fibrocystic changes Left mastectomy: 3 mm residual focus of grade 2 IDC and multiple foci of high-grade DCIS, margins negative, ER 0%, PR 0%, HER2 not performed, Ki-67 less than 1%   07/28/2021 Cancer Staging   Staging form: Breast, AJCC 7th Edition - Clinical: Stage IA (T1a, N0, M0) - Signed by Tammy Phlegm, NP on 07/28/2021     CHIEF COMPLIANT: Follow-up of left breast cancer on anastrozole therapy    INTERVAL HISTORY: Tammy Cooper is a 71 y.o. with above-mentioned history of left breast cancer treated with lumpectomy, radiation, and who is currently on antiestrogen therapy with anastrozole. She  presents to the clinic today for follow-up. She states that she has been doing ok. She complains of tingling in her left arm that radiates down to her hand. She still having blurry vision in her eye and it stays teary after she had her surgery. Diagnosis is it want get any worst but it also want get any better. She denies pain and discomfort in breast.  ALLERGIES:  is allergic to lipitor [atorvastatin] and other.  MEDICATIONS:  Current Outpatient Medications  Medication Sig Dispense Refill   aspirin EC 81 MG tablet Take 1 tablet (81 mg total) by mouth daily. 90 tablet 3   cholecalciferol (VITAMIN D3) 25 MCG (1000 UNIT) tablet Take 1,000 Units by mouth daily.     Co-Enzyme Q10 200 MG CAPS Take 300 mg by mouth daily. 90 capsule 3   ezetimibe (ZETIA) 10 MG tablet Take 1 tablet (10 mg total) by mouth daily. 90 tablet 3   Multiple Vitamin (MULTIVITAMIN WITH MINERALS) TABS tablet Take 1 tablet by mouth daily.     rosuvastatin (CRESTOR) 10 MG tablet Take 1 tablet (10 mg total) by mouth daily. 30 tablet 3   telmisartan (MICARDIS) 40 MG tablet Take 1 tablet (40 mg total) by mouth daily. 90 tablet 3   zinc gluconate 50 MG tablet Take 50 mg by mouth daily.     No current facility-administered medications for this visit.    PHYSICAL EXAMINATION: ECOG PERFORMANCE STATUS: 1 - Symptomatic but completely ambulatory  Vitals:   01/28/22 0956  BP: 126/71  Pulse: 68  Resp: 18  Temp: 97.7 F (36.5 C)  SpO2: 95%  Filed Weights   01/28/22 0956  Weight: 216 lb 14.4 oz (98.4 kg)    BREAST: Bilateral mastectomies no palpable lumps or nodules in the surgical scars (exam performed in the presence of a chaperone)  LABORATORY DATA:  Tammy have reviewed the data as listed    Latest Ref Rng & Units 12/08/2020   11:36 AM 10/23/2020    7:44 AM 12/12/2018    7:55 AM  CMP  Glucose 65 - 99 mg/dL 93   112   BUN 8 - 27 mg/dL 15   16   Creatinine 0.57 - 1.00 mg/dL 0.65   0.76   Sodium 134 - 144 mmol/L 142   141    Potassium 3.5 - 5.2 mmol/L 4.5   4.2   Chloride 96 - 106 mmol/L 102   101   CO2 20 - 29 mmol/L 21   21   Calcium 8.7 - 10.3 mg/dL 9.6   9.8   ALT 0 - 32 IU/L  20  25     Lab Results  Component Value Date   WBC 11.9 (H) 12/02/2015   HGB 11.3 (L) 12/02/2015   HCT 34.3 (L) 12/02/2015   MCV 89.8 12/02/2015   PLT 255 12/02/2015   NEUTROABS 3.8 12/07/2006    ASSESSMENT & PLAN:  Malignant neoplasm of upper-outer quadrant of left breast in female, estrogen receptor negative ([place]) 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 treatment: Adjuvant anastrozole 1 mg daily ( chosen because of history of TIA) since 11/18/2014-2021     Relapse/recurrence:Patient felt a lump and there were 2.5 cm of suspicious calcifications which on biopsy came back as microscopic focus of IDC with DCIS   04/07/2021: Bilateral mastectomies Right mastectomy: Fibrocystic changes Left mastectomy: 3 mm residual focus of grade 2 IDC and multiple foci of high-grade DCIS, margins negative, ER 0%, PR 0%, HER2 not performed, Ki-67 less than 1%    Patient is contemplating on seeing Dr. Claudia Cooper for plastic surgery consultation for helping with some symmetric in the chest wall.  Treatment plan: No role of radiation or chemotherapy or antiestrogen therapy. Return to clinic in 1 year for follow-up    No orders of the defined types were placed in this encounter.  The patient has a good understanding of the overall plan. she agrees with it. she will call with any problems that may develop before the next visit here. Total time spent: 30 mins including face to face time and time spent for planning, charting and co-ordination of care   Tammy Ohara, MD 01/28/22    Tammy Cooper am scribing for Dr. Lindi Cooper  Tammy have reviewed the above documentation for accuracy and completeness, and Tammy agree with the above.

## 2022-01-27 NOTE — Assessment & Plan Note (Signed)
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 treatment: Adjuvant anastrozole 1 mg daily ( chosen because of history of TIA) since 11/18/2014-2021   Relapse/recurrence:Patient felt a lump and there were 2.5 cm of suspicious calcifications which on biopsy came back as microscopic focus of IDC with DCIS  04/07/2021: Bilateral mastectomies Right mastectomy: Fibrocystic changes Left mastectomy: 3 mm residual focus of grade 2 IDC and multiple foci of high-grade DCIS, margins negative, ER 0%, PR 0%, HER2 not performed, Ki-67 less than 1%  Treatment plan: No role of radiation or chemotherapy or antiestrogen therapy. Return to clinic in 1 year for follow-up

## 2022-01-28 ENCOUNTER — Inpatient Hospital Stay: Payer: Medicare PPO | Attending: Hematology and Oncology | Admitting: Hematology and Oncology

## 2022-01-28 DIAGNOSIS — Z171 Estrogen receptor negative status [ER-]: Secondary | ICD-10-CM | POA: Insufficient documentation

## 2022-01-28 DIAGNOSIS — Z79811 Long term (current) use of aromatase inhibitors: Secondary | ICD-10-CM | POA: Diagnosis not present

## 2022-01-28 DIAGNOSIS — C50412 Malignant neoplasm of upper-outer quadrant of left female breast: Secondary | ICD-10-CM | POA: Insufficient documentation

## 2022-02-28 ENCOUNTER — Other Ambulatory Visit: Payer: Self-pay | Admitting: Cardiology

## 2022-03-02 ENCOUNTER — Other Ambulatory Visit: Payer: Self-pay | Admitting: Cardiology

## 2022-04-01 DIAGNOSIS — Z86000 Personal history of in-situ neoplasm of breast: Secondary | ICD-10-CM | POA: Diagnosis not present

## 2022-04-01 DIAGNOSIS — Z23 Encounter for immunization: Secondary | ICD-10-CM | POA: Diagnosis not present

## 2022-04-01 DIAGNOSIS — I10 Essential (primary) hypertension: Secondary | ICD-10-CM | POA: Diagnosis not present

## 2022-04-01 DIAGNOSIS — E7800 Pure hypercholesterolemia, unspecified: Secondary | ICD-10-CM | POA: Diagnosis not present

## 2022-04-01 DIAGNOSIS — Z8673 Personal history of transient ischemic attack (TIA), and cerebral infarction without residual deficits: Secondary | ICD-10-CM | POA: Diagnosis not present

## 2022-04-01 DIAGNOSIS — I700 Atherosclerosis of aorta: Secondary | ICD-10-CM | POA: Diagnosis not present

## 2022-04-01 DIAGNOSIS — Z1331 Encounter for screening for depression: Secondary | ICD-10-CM | POA: Diagnosis not present

## 2022-04-01 DIAGNOSIS — F411 Generalized anxiety disorder: Secondary | ICD-10-CM | POA: Diagnosis not present

## 2022-04-01 DIAGNOSIS — Z0000 Encounter for general adult medical examination without abnormal findings: Secondary | ICD-10-CM | POA: Diagnosis not present

## 2022-05-18 ENCOUNTER — Encounter: Payer: Self-pay | Admitting: Cardiology

## 2022-05-18 ENCOUNTER — Ambulatory Visit: Payer: Medicare PPO | Attending: Cardiology | Admitting: Cardiology

## 2022-05-18 ENCOUNTER — Telehealth: Payer: Self-pay

## 2022-05-18 VITALS — BP 142/60 | HR 53 | Ht 63.0 in | Wt 214.2 lb

## 2022-05-18 DIAGNOSIS — E7800 Pure hypercholesterolemia, unspecified: Secondary | ICD-10-CM

## 2022-05-18 DIAGNOSIS — I6521 Occlusion and stenosis of right carotid artery: Secondary | ICD-10-CM

## 2022-05-18 DIAGNOSIS — I10 Essential (primary) hypertension: Secondary | ICD-10-CM

## 2022-05-18 DIAGNOSIS — I639 Cerebral infarction, unspecified: Secondary | ICD-10-CM | POA: Diagnosis not present

## 2022-05-18 NOTE — Telephone Encounter (Signed)
Faxed/routed encounter for today's visit to PCP, requested recent fasting labs be sent to fax number [phone].

## 2022-05-18 NOTE — Progress Notes (Signed)
Date:  05/18/2022   ID:  Tammy Cooper, DOB 12-06-1950, MRN [id number]   PCP:  Donald Prose, MD  Cardiologist:  Fransico Him, MD Electrophysiologist:  Vickie Epley, MD   Chief Complaint:  Hypertension and Hyperlipidemia  History of Present Illness:    Tammy Cooper is a 72 y.o. female with a hx of HTN, carotid stenosis and dyslipidemia.  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  2D echo was normal, carotid dopplers showed 1-39% RICA stenosis and event monitor was normal.     She is here today for followup and is doing well.  Since I saw her last she was dx with a reoccurrence of  breast CA and is s/p bilateral mastectomy in December 22.  She has had some problems with pain in the area of her breast surgery and felt like the muscle was contracting.    She denies any exertional chest pain or pressure, SOB, DOE, PND, orthopnea, LE edema, dizziness, palpitations or syncope. She is compliant with her meds and is tolerating meds with no SE.    Prior CV studies:   The following studies were reviewed today:  EKG, 2D echo, event monitor, carotid dopplers  Past Medical History:  Diagnosis Date   Anxiety    Arthritis    Breast cancer ([place]) 05/28/2014   Left breast in situ    Carotid stenosis, asymptomatic, right    1-39% right ICA stenosis and left subclavian stenosis   Complication of anesthesia    Constipation    occasiopnal   Depression    History of breast cancer 04/25/2014   History of recurrent cystitis    Hypercholesteremia    Hypertension    PONV (postoperative nausea and vomiting)    "a little nausea"   Spinal stenosis    Stroke [facility])    H/O TIA in 2006, MRA clear,arteriogram was done which was normal   Past Surgical History:  Procedure Laterality Date   anal skin tag removal  10/21/10   BACK SURGERY  07/12/2010   stenosis L4 + L5   BLADDER TACK     WITH THE HYSTERECTOMY   BREAST BIOPSY Left  06/13/2014   Benign   BREAST BIOPSY Left 05/28/2014   High Risk   BREAST LUMPECTOMY Left 07/31/2014   BREAST LUMPECTOMY WITH RADIOACTIVE SEED LOCALIZATION Left 07/31/2014   Procedure: LEFT BREAST LUMPECTOMY WITH RADIOACTIVE SEED LOCALIZATION;  Surgeon: Coralie Keens, MD;  Location: [place];  Service: General;  Laterality: Left;   COLONOSCOPY     HEMORROIDECTOMY     KNEE ARTHROSCOPY  2010   left   ROTATOR CUFF REPAIR  2005   rt shoulder   TONSILLECTOMY     TOTAL KNEE ARTHROPLASTY Right 08/04/2015   Procedure: RIGHT TOTAL KNEE ARTHROPLASTY;  Surgeon: Paralee Cancel, MD;  Location: [facility];  Service: Orthopedics;  Laterality: Right;   TOTAL KNEE ARTHROPLASTY Left 12/01/2015   Procedure: LEFT TOTAL KNEE ARTHROPLASTY;  Surgeon: Paralee Cancel, MD;  Location: [facility];  Service: Orthopedics;  Laterality: Left;   TOTAL MASTECTOMY Bilateral 04/07/2021   Procedure: BILATERAL TOTAL MASTECTOMY;  Surgeon: Coralie Keens, MD;  Location: [place];  Service: General;  Laterality: Bilateral;   VAGINAL HYSTERECTOMY  2003   total hyst-BSO-Ant/post repair     Current Meds  Medication Sig   aspirin EC 81 MG tablet Take 1 tablet (81 mg total) by mouth daily.   cholecalciferol (VITAMIN D3)  25 MCG (1000 UNIT) tablet Take 1,000 Units by mouth daily.   Co-Enzyme Q10 200 MG CAPS Take 300 mg by mouth daily.   ezetimibe (ZETIA) 10 MG tablet TAKE ONE TABLET BY MOUTH DAILY   Multiple Vitamin (MULTIVITAMIN WITH MINERALS) TABS tablet Take 1 tablet by mouth daily.   rosuvastatin (CRESTOR) 10 MG tablet Take 1 tablet (10 mg total) by mouth daily.   telmisartan (MICARDIS) 40 MG tablet Take 1 tablet (40 mg total) by mouth daily. Keep your appointment in January for future refills.   zinc gluconate 50 MG tablet Take 50 mg by mouth daily.     Allergies:   Lipitor [atorvastatin] and Other   Social History   Tobacco Use   Smoking status: Never   Smokeless tobacco: Never  Vaping Use    Vaping Use: Never used  Substance Use Topics   Alcohol use: Yes    Comment: ocassionally   Drug use: No     Family Hx: The patient's family history includes CAD in her brother; Cancer in her father; Hypertension in her mother and sister. There is no history of Heart attack.  ROS:   Please see the history of present illness.     All other systems reviewed and are negative.   Labs/Other Tests and Data Reviewed:    Recent Labs: No results found for requested labs within last 365 days.   Recent Lipid Panel Lab Results  Component Value Date/Time   CHOL 133 10/23/2020 07:44 AM   CHOL 140 10/23/2015 07:34 AM   CHOL 160 09/02/2014 07:30 AM   TRIG 38 10/23/2020 07:44 AM   TRIG 139 10/23/2015 07:34 AM   TRIG 206 (H) 09/02/2014 07:30 AM   HDL 69 10/23/2020 07:44 AM   HDL 53 10/23/2015 07:34 AM   HDL 55 09/02/2014 07:30 AM   CHOLHDL 1.9 10/23/2020 07:44 AM   CHOLHDL 2.6 10/23/2015 07:34 AM   CHOLHDL 3.1 CALC 05/29/2006 10:13 AM   LDLCALC 54 10/23/2020 07:44 AM   LDLCALC 59 10/23/2015 07:34 AM   LDLCALC 64 09/02/2014 07:30 AM    Wt Readings from Last 3 Encounters:  05/18/22 214 lb 3.2 oz (97.2 kg)  01/28/22 216 lb 14.4 oz (98.4 kg)  07/28/21 204 lb 9.6 oz (92.8 kg)     Objective:    Vital Signs:  BP (!) 142/60   Pulse (!) 53   Ht '5\' 3"'$  (1.6 m)   Wt 214 lb 3.2 oz (97.2 kg)   SpO2 95%   BMI 37.94 kg/m    GEN: Well nourished, well developed in no acute distress HEENT: Normal NECK: No JVD; No carotid bruits LYMPHATICS: No lymphadenopathy CARDIAC:RRR, no murmurs, rubs, gallops RESPIRATORY:  Clear to auscultation without rales, wheezing or rhonchi  ABDOMEN: Soft, non-tender, non-distended MUSCULOSKELETAL:  No edema; No deformity  SKIN: Warm and dry NEUROLOGIC:  Alert and oriented x 3 PSYCHIATRIC:  Normal affect   EKG was performed in the office today and showed sinus bradycardia at 53bpm with no ST changes   ASSESSMENT & PLAN:    Hypertension  -BP is borderline  controlled on exam today -Continue prescription drug management with telmisartan 40 mg daily with as needed refills  Hyperlipidemia  -Her LDL goal is < 70.   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   -I will check with Dr. Quentin Ore to see if he recommends another ILR implant since she was only monitored for 3 months  Medication Adjustments/Labs and Tests Ordered: Current medicines are reviewed at length with the patient today.  Concerns regarding medicines are outlined above.  Tests Ordered: Orders Placed This Encounter  Procedures   EKG 12-Lead    Medication Changes: No orders of the defined types were placed in this encounter.    Disposition:  Follow up in 1 year(s)  Signed, Fransico Him, MD  05/18/2022 9:55 AM    [facility]

## 2022-05-18 NOTE — Patient Instructions (Signed)
Medication Instructions:  Your physician recommends that you continue on your current medications as directed. Please refer to the Current Medication list given to you today.  *If you need a refill on your cardiac medications before your next appointment, please call your pharmacy*   Lab Work: None.  If you have labs (blood work) drawn today and your tests are completely normal, you will receive your results only by: MyChart Message (if you have MyChart) OR A paper copy in the mail If you have any lab test that is abnormal or we need to change your treatment, we will call you to review the results.   Testing/Procedures: None.   Follow-Up: At [facility], you and your health needs are our priority.  As part of our continuing mission to provide you with exceptional heart care, we have created designated Provider Care Teams.  These Care Teams include your primary Cardiologist (physician) and Advanced Practice Providers (APPs -  Physician Assistants and Nurse Practitioners) who all work together to provide you with the care you need, when you need it.  We recommend signing up for the patient portal called "MyChart".  Sign up information is provided on this After Visit Summary.  MyChart is used to connect with patients for Virtual Visits (Telemedicine).  Patients are able to view lab/test results, encounter notes, upcoming appointments, etc.  Non-urgent messages can be sent to your provider as well.   To learn more about what you can do with MyChart, go to [email or web address].    Your next appointment:   1 year(s)  Provider:   Traci Turner, MD     

## 2022-05-21 ENCOUNTER — Encounter: Payer: Self-pay | Admitting: Cardiology

## 2022-05-24 ENCOUNTER — Other Ambulatory Visit: Payer: Self-pay | Admitting: Cardiology

## 2022-05-24 ENCOUNTER — Other Ambulatory Visit: Payer: Self-pay

## 2022-05-25 ENCOUNTER — Telehealth: Payer: Self-pay

## 2022-05-25 NOTE — Telephone Encounter (Signed)
-----  Message from Vickie Epley, MD sent at 05/25/2022  5:21 AM EST ----- Tammy Cooper, sorry for delayed response. Yes, I think it would be reasonable to reimplant now that she has healed from her mastectomies. [name], can you check with the patient to see if she is interested and if so we can get her set up for reimplant. Thanks! Lars Mage      ----- Message ----- From: Sueanne Margarita, MD Sent: 05/18/2022  10:11 AM EST To: Vickie Epley, MD  [name] you placed an ILR in this patient 9/22 and then she had recurrent breast CA and had bilateral mastectomies in 12/22 with removal of ILR as well.  What are your thoughts on placing another ILR given that she was only monitored for 3 months.  It was initially placed for Ocular CVA.

## 2022-05-25 NOTE — Telephone Encounter (Signed)
Left message for patient to call back  

## 2022-05-30 ENCOUNTER — Other Ambulatory Visit: Payer: Self-pay | Admitting: Cardiology

## 2022-06-02 NOTE — Telephone Encounter (Signed)
Spoke with the patient and she would like some time to think about whether or not she wants another loop recorder implanted. She states that she is also thinking about having some plastic surgery done on her breast to repair some scar tissue. She will send a mychart message to let us know what she decides.

## 2022-06-06 ENCOUNTER — Other Ambulatory Visit: Payer: Self-pay | Admitting: Cardiology

## 2022-06-16 DIAGNOSIS — D2262 Melanocytic nevi of left upper limb, including shoulder: Secondary | ICD-10-CM | POA: Diagnosis not present

## 2022-06-16 DIAGNOSIS — D045 Carcinoma in situ of skin of trunk: Secondary | ICD-10-CM | POA: Diagnosis not present

## 2022-06-16 DIAGNOSIS — L820 Inflamed seborrheic keratosis: Secondary | ICD-10-CM | POA: Diagnosis not present

## 2022-06-16 DIAGNOSIS — D225 Melanocytic nevi of trunk: Secondary | ICD-10-CM | POA: Diagnosis not present

## 2022-06-16 DIAGNOSIS — L821 Other seborrheic keratosis: Secondary | ICD-10-CM | POA: Diagnosis not present

## 2022-06-16 DIAGNOSIS — D2261 Melanocytic nevi of right upper limb, including shoulder: Secondary | ICD-10-CM | POA: Diagnosis not present

## 2022-06-16 DIAGNOSIS — D485 Neoplasm of uncertain behavior of skin: Secondary | ICD-10-CM | POA: Diagnosis not present

## 2022-06-16 DIAGNOSIS — L570 Actinic keratosis: Secondary | ICD-10-CM | POA: Diagnosis not present

## 2022-06-30 IMAGING — MG MM DIGITAL DIAGNOSTIC UNILAT*L* W/ TOMO W/ CAD
5 of 8 series · 5 of 20 positions shown · non-contrast
Comparison: Previous exam(s).
COMPARISON: Previous exam(s).

Addendum:
CLINICAL DATA: 70-year-old female with history of left breast DCIS
status post lumpectomy in 4887. She has an X shaped biopsy marking
clip in the lateral posterior left breast from a benign biopsy of
calcifications in 4887 showing fibrocystic changes. She presents
today with a new palpable lump in the lateral aspect of the left
breast.

EXAM:
DIGITAL DIAGNOSTIC UNILATERAL LEFT MAMMOGRAM WITH TOMOSYNTHESIS AND
CAD; ULTRASOUND LEFT BREAST LIMITED
TECHNIQUE: Left digital diagnostic mammography and breast tomosynthesis was
performed. The images were evaluated with computer-aided detection.;
Targeted ultrasound examination of the left breast was performed.

[L CC]
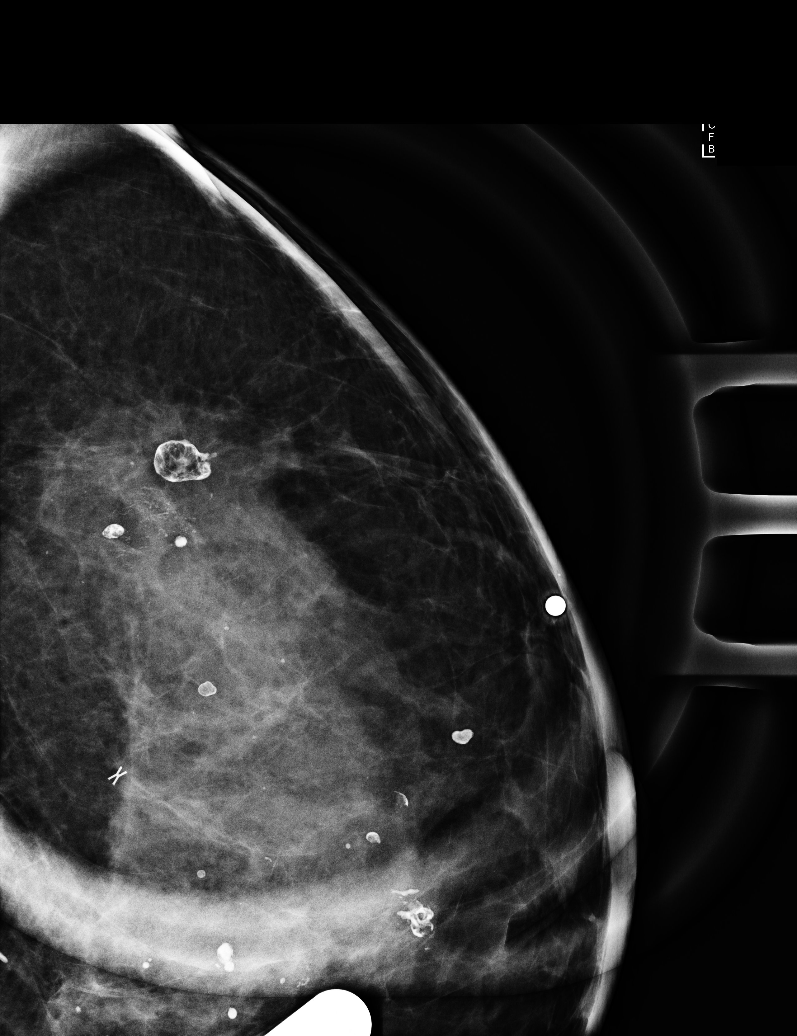

[L ML]
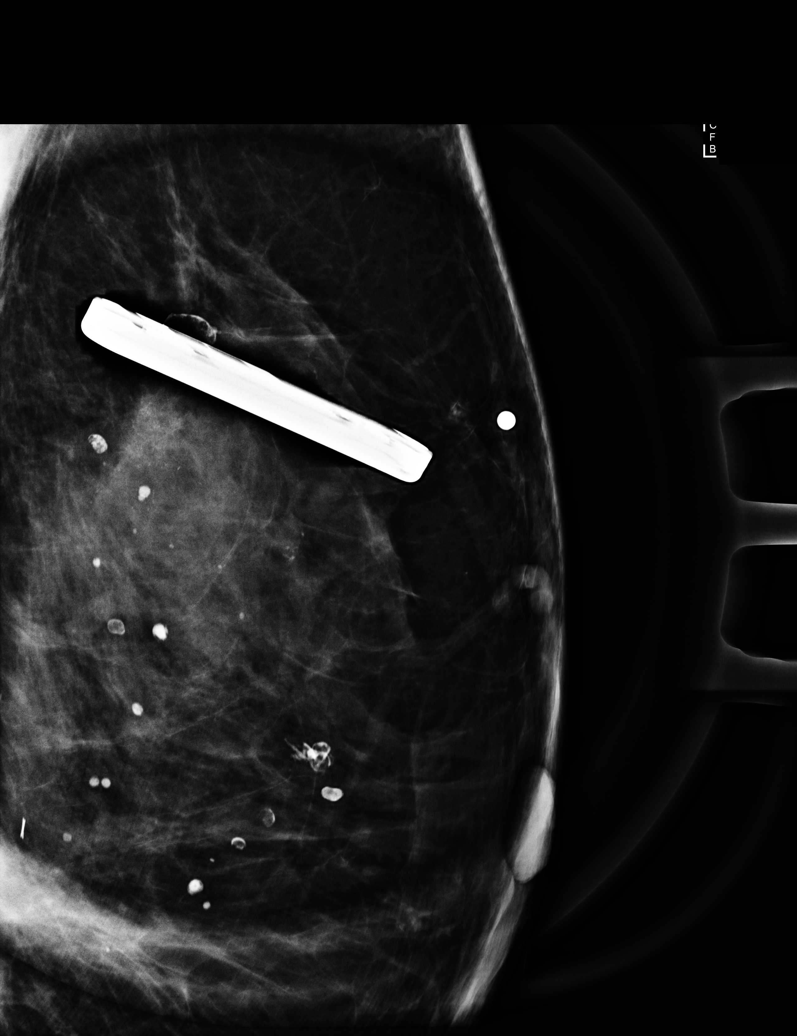

[L TAN synth-2D]
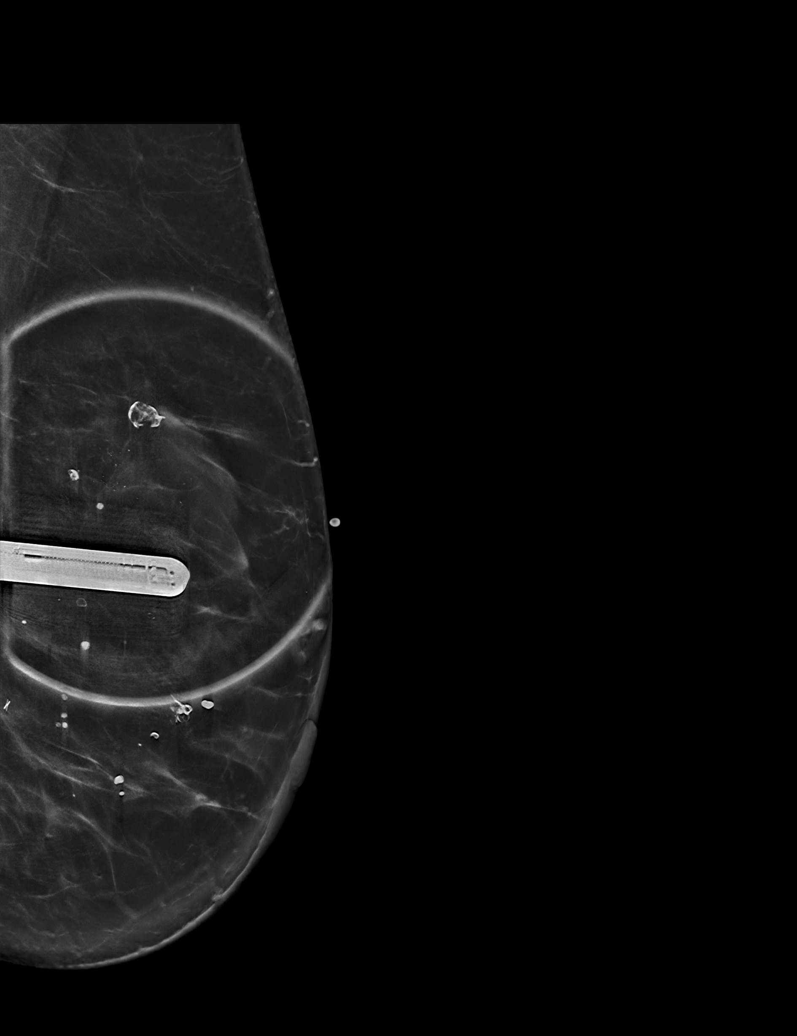

[L MLO synth-2D]
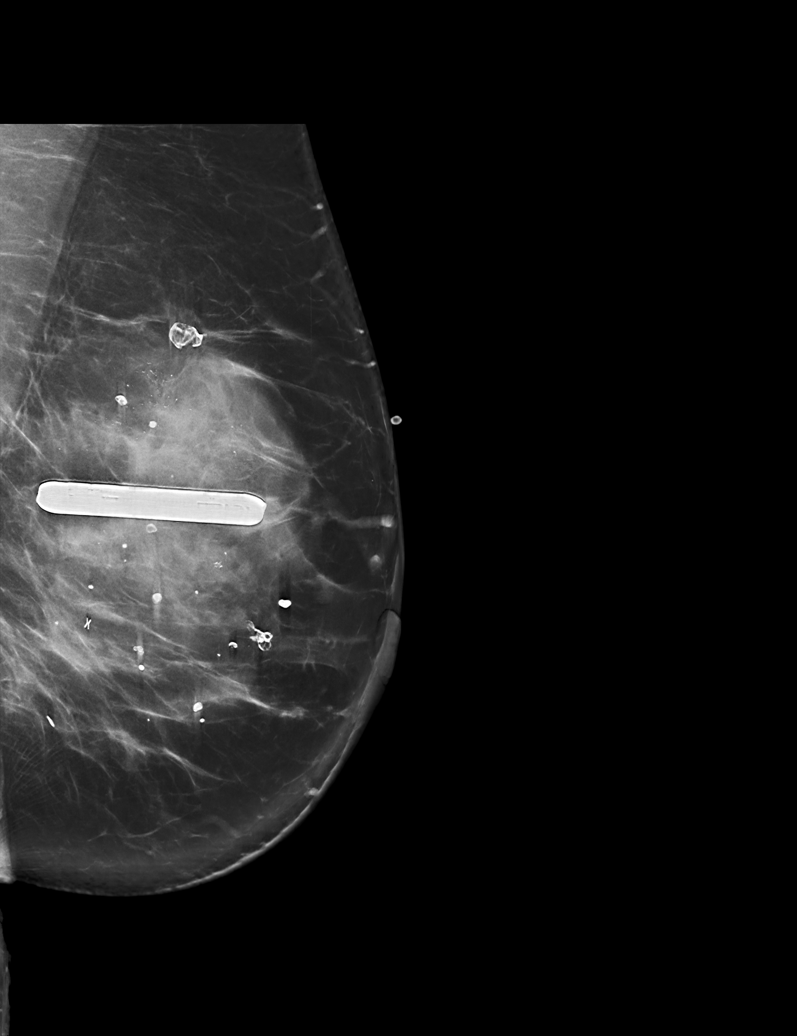

[L CC synth-2D]
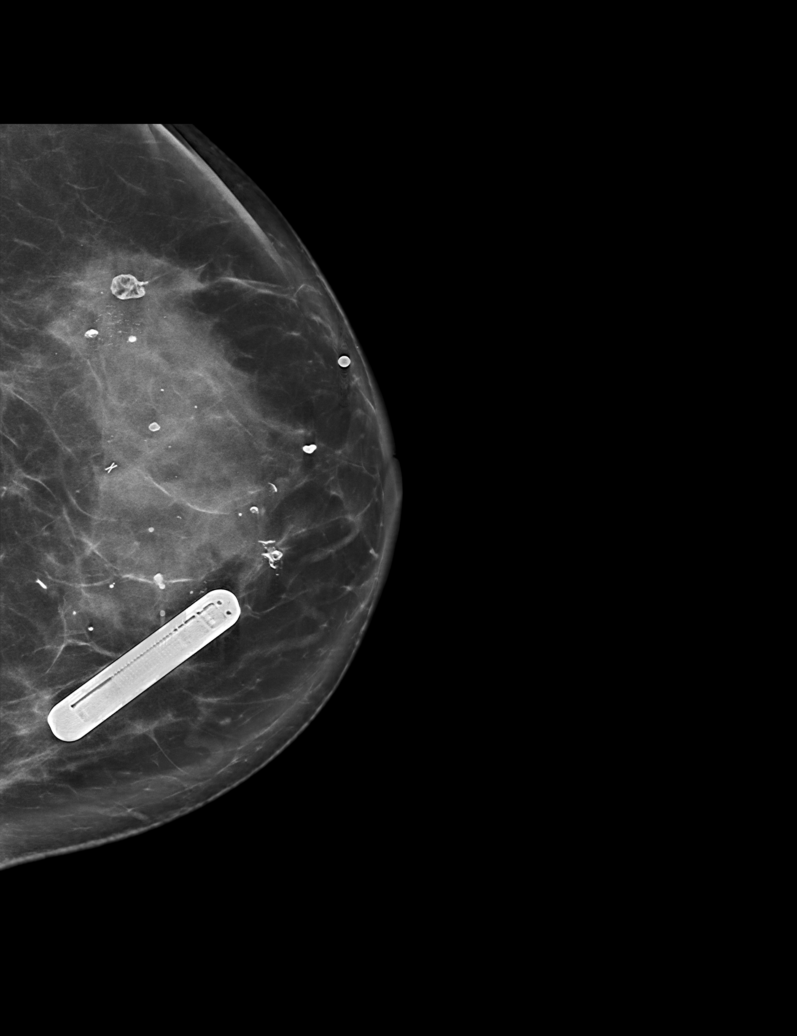

[5 of 20 positions shown; findings below may reference images not displayed]

ACR Breast Density Category c: The breast tissue is heterogeneously
dense, which may obscure small masses.
FINDINGS: Spot compression tomosynthesis images over the palpable site of
concern demonstrates no suspicious masses. Surgical distortion
surrounding a benign oil cyst is noted in the upper-outer quadrant,
however no new pathologic distortion is identified. Spot compression
magnification images of the upper-outer right breast demonstrates a
2.5 cm area of fine linear apparently branching calcifications at
the palpable site of concern.

On physical exam, there is a firm palpable density in the central
left breast, most prominently palpated in the upper-outer quadrant.

Ultrasound targeted to the palpable site demonstrates heterogeneous
echogenic tissue, with some areas of scattered echogenic foci likely
representing the calcifications seen mammographically. There is an
oil cyst with surgical distortion in the upper-outer quadrant
consistent with history of lumpectomy. No evidence of left axillary
lymphadenopathy.
IMPRESSION: 1. There are suspicious calcifications in the upper-outer quadrant
within the palpable area of concern in the upper-outer left breast.
There is no clear sonographic correlate.

2.  No evidence of left axillary lymphadenopathy.

RECOMMENDATION:
Stereotactic biopsy is recommended for the left breast
calcifications. The procedure has been scheduled for 03/11/2021 at
[DATE] a.m. The patient asked if she could take Valium prior to the
procedure. We assured that she can, however she will need a driver
to accompany her.

I have discussed the findings and recommendations with the patient.
If applicable, a reminder letter will be sent to the patient
regarding the next appointment.

BI-RADS CATEGORY  4: Suspicious.

ADDENDUM:
The findings section of the original report states that spot
compression magnification images were performed of the upper-outer
right breast. This is incorrect. All mammogram imaging was performed
on the LEFT breast.

*** End of Addendum ***
ACR Breast Density Category c: The breast tissue is heterogeneously
dense, which may obscure small masses.
FINDINGS: Spot compression tomosynthesis images over the palpable site of
concern demonstrates no suspicious masses. Surgical distortion
surrounding a benign oil cyst is noted in the upper-outer quadrant,
however no new pathologic distortion is identified. Spot compression
magnification images of the upper-outer right breast demonstrates a
2.5 cm area of fine linear apparently branching calcifications at
the palpable site of concern.

On physical exam, there is a firm palpable density in the central
left breast, most prominently palpated in the upper-outer quadrant.

Ultrasound targeted to the palpable site demonstrates heterogeneous
echogenic tissue, with some areas of scattered echogenic foci likely
representing the calcifications seen mammographically. There is an
oil cyst with surgical distortion in the upper-outer quadrant
consistent with history of lumpectomy. No evidence of left axillary
lymphadenopathy.
IMPRESSION: 1. There are suspicious calcifications in the upper-outer quadrant
within the palpable area of concern in the upper-outer left breast.
There is no clear sonographic correlate.

2.  No evidence of left axillary lymphadenopathy.

RECOMMENDATION:
Stereotactic biopsy is recommended for the left breast
calcifications. The procedure has been scheduled for 03/11/2021 at
[DATE] a.m. The patient asked if she could take Valium prior to the
procedure. We assured that she can, however she will need a driver
to accompany her.

I have discussed the findings and recommendations with the patient.
If applicable, a reminder letter will be sent to the patient
regarding the next appointment.

BI-RADS CATEGORY  4: Suspicious.

## 2022-07-25 HISTORY — PX: COLONOSCOPY: SHX174

## 2022-07-28 ENCOUNTER — Ambulatory Visit: Payer: Medicare PPO | Admitting: Podiatry

## 2022-07-28 DIAGNOSIS — B351 Tinea unguium: Secondary | ICD-10-CM | POA: Diagnosis not present

## 2022-07-28 MED ORDER — TERBINAFINE HCL 250 MG PO TABS: ORAL_TABLET | ORAL | 0 refills | Status: DC

## 2022-07-28 NOTE — Progress Notes (Signed)
Subjective:   Patient ID: Tammy Cooper, female   DOB: 72 y.o.   MRN: [id number]   HPI Patient presents stating that she has discoloration of nailbeds both feet and states that she is not sure exactly what happened it seems to be recent but she is not a good historian from that standpoint as she wears polish most the time.  Patient does not member injury does not smoke likes to be active   Review of Systems  All other systems reviewed and are negative.       Objective:  Physical Exam Vitals and nursing note reviewed.  Constitutional:      Appearance: She is well-developed.  Pulmonary:     Effort: Pulmonary effort is normal.  Musculoskeletal:        General: Normal range of motion.  Skin:    General: Skin is warm.  Neurological:     Mental Status: She is alert.     Neurovascular status intact muscle strength adequate range of motion adequate patient found to have discoloration of the nailbeds with lifting of the hallux nailbeds of a mild nature.  Patient has good digital perfusion well-oriented with the distal one third of the nailbeds that are involved     Assessment:  Difficult to say between fungus or trauma but there does appear to be a discoloration pattern that has formed     Plan:  H&P reviewed and I have recommended a oral pulse pack along with laser for this condition.  Patient scheduled for laser and I sent the prescription off patient will be seen back if symptoms persist that should solve it

## 2022-08-03 ENCOUNTER — Ambulatory Visit (INDEPENDENT_AMBULATORY_CARE_PROVIDER_SITE_OTHER): Payer: BC Managed Care – PPO

## 2022-08-03 DIAGNOSIS — B351 Tinea unguium: Secondary | ICD-10-CM

## 2022-08-03 NOTE — Patient Instructions (Signed)

## 2022-08-03 NOTE — Progress Notes (Signed)
Patient presents today for the 1st laser treatment. Diagnosed with mycotic nail infection by Dr. Charlsie Merles .   Toenail most affected bilateral hallux.  All other systems are negative.  Nails were filed thin. Laser therapy was administered to 1-5 toenails bilaterally and patient tolerated the treatment well. All safety precautions were in place.   Single laser pass was done on non-affected nails.   Follow up in 4 weeks for laser # 2.  Picture of nails taken today to document visual progress

## 2022-08-10 ENCOUNTER — Telehealth: Payer: Self-pay | Admitting: Cardiology

## 2022-08-10 NOTE — Telephone Encounter (Signed)
  Per MyChart Scheduling message:  I have decided that I would like to have another loop recorder implant. I am experiencing some light headedness and what feels like heart racing, both are symptoms that I have noticed in the past week. I thought I would reach out to you, but I can also contact Dr. Lovena Neighbours nurse as well. I will wait for your advice. Thanks, Tammy Cooper

## 2022-08-11 NOTE — Telephone Encounter (Signed)
Pt saw Dr. Mayford Knife in January and was recommended to re-implant ILR.  Pt wanted to think about it. She calls in today and would like to have ILR implant again (previous ILR was only in for 3 months, for stroke, but taken out when she had double mastectomy). Aware forwarding note to scheduler who will arrange for her to come back to see Dr. Lalla Brothers and possibly reimplant another ILR. Pt appreciates my return call and agreeable to plan.  [facility] - we will need pre-auth for ILR)

## 2022-08-23 DIAGNOSIS — N651 Disproportion of reconstructed breast: Secondary | ICD-10-CM | POA: Diagnosis not present

## 2022-08-23 DIAGNOSIS — Z853 Personal history of malignant neoplasm of breast: Secondary | ICD-10-CM | POA: Diagnosis not present

## 2022-08-30 ENCOUNTER — Other Ambulatory Visit (HOSPITAL_COMMUNITY): Payer: Self-pay | Admitting: Family Medicine

## 2022-08-30 ENCOUNTER — Ambulatory Visit (HOSPITAL_COMMUNITY)
Admission: RE | Admit: 2022-08-30 | Discharge: 2022-08-30 | Disposition: A | Discharge status: Home / Self Care | Payer: Medicare PPO | Source: Ambulatory Visit | Attending: Family Medicine | Admitting: Family Medicine

## 2022-08-30 DIAGNOSIS — M79662 Pain in left lower leg: Secondary | ICD-10-CM | POA: Diagnosis not present

## 2022-08-30 DIAGNOSIS — M79652 Pain in left thigh: Secondary | ICD-10-CM | POA: Diagnosis not present

## 2022-08-30 DIAGNOSIS — M7989 Other specified soft tissue disorders: Secondary | ICD-10-CM | POA: Diagnosis not present

## 2022-08-30 DIAGNOSIS — Z9181 History of falling: Secondary | ICD-10-CM | POA: Diagnosis not present

## 2022-08-30 DIAGNOSIS — I10 Essential (primary) hypertension: Secondary | ICD-10-CM | POA: Diagnosis not present

## 2022-08-30 DIAGNOSIS — D0512 Intraductal carcinoma in situ of left breast: Secondary | ICD-10-CM | POA: Diagnosis not present

## 2022-08-30 NOTE — Progress Notes (Signed)
Left lower extremity venous study completed.   Preliminary results relayed to [facility].  Please see CV Procedures for preliminary results.  Taniqua Issa, RVT  3:40 PM 08/30/22

## 2022-09-07 ENCOUNTER — Other Ambulatory Visit: Payer: Self-pay | Admitting: Cardiology

## 2022-09-09 ENCOUNTER — Ambulatory Visit (INDEPENDENT_AMBULATORY_CARE_PROVIDER_SITE_OTHER): Payer: BC Managed Care – PPO

## 2022-09-09 DIAGNOSIS — B351 Tinea unguium: Secondary | ICD-10-CM

## 2022-09-09 NOTE — Progress Notes (Signed)
Patient presents today for the 2nd laser treatment. Diagnosed with mycotic nail infection by Dr. Charlsie Merles .   Toenail most affected bilateral hallux.  All other systems are negative.  Nails were filed thin. Laser therapy was administered to 1-5 toenails bilaterally and patient tolerated the treatment well. All safety precautions were in place.   Single laser pass was done on non-affected nails.   Follow up in 4 weeks for laser # 3.

## 2022-09-14 ENCOUNTER — Encounter: Payer: Self-pay | Admitting: Podiatry

## 2022-09-14 ENCOUNTER — Ambulatory Visit (INDEPENDENT_AMBULATORY_CARE_PROVIDER_SITE_OTHER): Payer: Medicare PPO

## 2022-09-14 ENCOUNTER — Ambulatory Visit (INDEPENDENT_AMBULATORY_CARE_PROVIDER_SITE_OTHER): Payer: Medicare PPO | Admitting: Podiatry

## 2022-09-14 DIAGNOSIS — B351 Tinea unguium: Secondary | ICD-10-CM | POA: Diagnosis not present

## 2022-09-14 DIAGNOSIS — R252 Cramp and spasm: Secondary | ICD-10-CM

## 2022-09-14 DIAGNOSIS — M76822 Posterior tibial tendinitis, left leg: Secondary | ICD-10-CM

## 2022-09-14 MED ORDER — TRIAMCINOLONE ACETONIDE 10 MG/ML IJ SUSP
10.0000 mg | Freq: Once | INTRAMUSCULAR | Status: AC
  Administered 2022-09-14: 10 mg

## 2022-09-14 NOTE — Progress Notes (Signed)
Subjective:   Patient ID: Tammy Cooper, female   DOB: 72 y.o.   MRN: [id number]   HPI Patient states she is having some pain in her left ankle on the inside of the bottom 6 8 weeks and her nails seem to be improving and she is pleased so far with how this is doing   ROS      Objective:  Physical Exam  Neuro vascular status intact with inflammation around the posterior tibial tendon near its insertion into the navicular left with patient found to have improvement in the nailbeds bilateral with minimal discoloration currently and taking her medication has had 2 lasers     Assessment:  Doing well post laser surgery and medication with posterior tibial tendinitis left no indication of tendon rupture or damage     Plan:  H&P reviewed both conditions and for the left I did do a careful injection of the posterior tib in the sheath 3 mg Dexasone Kenalog 5 mg Xylocaine applied fascial brace to lift up the arch all instructions on usage along with supportive shoe and patient will be seen back.  X-rays indicate moderate depression of the arch bilateral no other indication of pathology currently

## 2022-09-21 DIAGNOSIS — Z853 Personal history of malignant neoplasm of breast: Secondary | ICD-10-CM | POA: Diagnosis not present

## 2022-09-21 DIAGNOSIS — N651 Disproportion of reconstructed breast: Secondary | ICD-10-CM | POA: Diagnosis not present

## 2022-09-21 DIAGNOSIS — N650 Deformity of reconstructed breast: Secondary | ICD-10-CM | POA: Diagnosis not present

## 2022-10-14 ENCOUNTER — Ambulatory Visit: Payer: Medicare PPO | Admitting: Cardiology

## 2022-10-21 ENCOUNTER — Ambulatory Visit (INDEPENDENT_AMBULATORY_CARE_PROVIDER_SITE_OTHER): Payer: BC Managed Care – PPO | Admitting: *Deleted

## 2022-10-21 ENCOUNTER — Encounter: Payer: Self-pay | Admitting: Podiatry

## 2022-10-21 ENCOUNTER — Ambulatory Visit (INDEPENDENT_AMBULATORY_CARE_PROVIDER_SITE_OTHER): Payer: Medicare PPO | Admitting: Podiatry

## 2022-10-21 DIAGNOSIS — M76821 Posterior tibial tendinitis, right leg: Secondary | ICD-10-CM

## 2022-10-21 DIAGNOSIS — M76822 Posterior tibial tendinitis, left leg: Secondary | ICD-10-CM | POA: Diagnosis not present

## 2022-10-21 DIAGNOSIS — B351 Tinea unguium: Secondary | ICD-10-CM

## 2022-10-21 NOTE — Progress Notes (Signed)
Patient presents today for the 3rd laser treatment. Diagnosed with mycotic nail infection by Dr. Charlsie Merles .   Toenail most affected bilateral hallux.  All other systems are negative.  Nails were filed thin. Laser therapy was administered to 1-5 toenails bilaterally and patient tolerated the treatment well. All safety precautions were in place.   Single laser pass was done on non-affected nails.   Follow up in 6 weeks for laser # 4

## 2022-10-21 NOTE — Progress Notes (Signed)
Subjective:   Patient ID: Tammy Cooper, female   DOB: 72 y.o.   MRN: [id number]   HPI Patient states her foot is feeling quite a bit better but she feels like she needs more support and is trying to wear good shoes but could use something more prominent   ROS      Objective:  Physical Exam  Neurovascular status intact exquisite discomfort noted around the posterior tibial tendon left localized with moderate depression of the arch associated with it but improvement from previous visit     Assessment:  Posterior tibial tendinitis left improving but does have structural change     Plan:  H&P reviewed went ahead today and recommended orthotics casted for functional orthotic devices will see back for fitting with instructions given on wearing supportive shoes until that time

## 2022-10-25 ENCOUNTER — Telehealth: Payer: Self-pay | Admitting: Cardiology

## 2022-10-25 NOTE — Telephone Encounter (Signed)
Called patient about her symptoms. It has been since she made the last appointment 10/14/22 which was made in 07/2022 that she has been feeling lightheaded intermittently.  It was worse today than usual.    Unsure when heart racing started - coming/going for at least 2 months.  Today she felt like she was going to pass out.  She was sitting  reading some old letters at that time.  Closed eyes for a min and it passed.  No heart racing at that time.  She is unsure if her eyes are bothering her or the heart that is making her feel this way.  History of eye stroke.  No chest pain, no new shortness of breath. Tingling in left arm has been going on over last couple months as well.  Also comes and goes.  Continues to do water aerobics a couple times a week.    Aware I will forward to Dr. Lovena Neighbours and Dr. Norris Cross nurses and if they wish to do something in the meantime we will call her back.  Pt appreciative for assistance.

## 2022-10-25 NOTE — Telephone Encounter (Signed)
See symptoms of lightheadedness, HR, and tingling in arm, per patient schedule message:   "I would like to reschedule my appointment with Dr. Lalla Brothers regarding the loop recorder. I had to cancel my last appointment due to a death in my family. I have been having light headedness, feelings of heart racing and tingling in my left arm. Please reschedule as soon as possible. Thanks. Raynelle Fanning"  Message has been forwarded to [facility] for rescheduling.

## 2022-11-01 NOTE — Progress Notes (Unsigned)
  Cardiology Office Note:  .   Date:  11/01/2022  ID:  Lucile Shutters Cooper, DOB 1950-08-14, MRN [id number] PCP: Deatra James, MD  [facility] Providers Cardiologist:  Armanda Magic, MD Electrophysiologist:  Lanier Prude, MD { Click to update primary MD,subspecialty MD or APP then REFRESH:1}   Patient Profile: .      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 Mayford Knife at which time discussion ensued about reimplantation of loop recorder due to short monitoring period post CVA. ILR had to be removed when she underwent bilateral mastectomy. No significant cardiac concerns were indicated and 1 year follow-up was advised.        History of Present Illness: Tammy Cooper   Tammy Cooper is a *** 73 y.o. female who is here today for evaluation of lightheadedness.   ROS: ***       Studies Reviewed: .        *** Risk Assessment/Calculations:   {Does this patient have ATRIAL FIBRILLATION?[phone]} No BP recorded.  {Refresh Note OR Click here to enter BP  :1}***       Physical Exam:   VS:  There were no vitals taken for this visit.   Wt Readings from Last 3 Encounters:  05/18/22 214 lb 3.2 oz (97.2 kg)  01/28/22 216 lb 14.4 oz (98.4 kg)  07/28/21 204 lb 9.6 oz (92.8 kg)    GEN: Well nourished, well developed in no acute distress NECK: No JVD; No carotid bruits CARDIAC: ***RRR, no murmurs, rubs, gallops RESPIRATORY:  Clear to auscultation without rales, wheezing or rhonchi  ABDOMEN: Soft, non-tender, non-distended EXTREMITIES:  No edema; No deformity     ASSESSMENT AND PLAN: .    Lightheadedness:  Dyslipidemia:  Hypertension:  Cryptogenic Ocular stroke:     {Are you ordering a CV Procedure (e.g. stress test, cath, DCCV, TEE, etc)?   Press F2        :[id number]}  Dispo: ***  Signed, Eligha Bridegroom, NP-C

## 2022-11-01 NOTE — Telephone Encounter (Signed)
Called to schedule patient w/ APP, no answer. LM asking patient to call our office to schedule appt w/ APP.

## 2022-11-01 NOTE — Telephone Encounter (Signed)
Patient calling to discuss recent symptoms of lightheadedness. Patient states she has had intermittent lightheadedness over the past 1-2 months. She has also had arm tingling over the past few months. Offered appointment with APP, gave precautions such as do not drive if you feel you are going to pass out. Call 911 if you pass out and lose consciousness. Patient scheduled for 11/03/22 at 8:50 AM.

## 2022-11-03 ENCOUNTER — Ambulatory Visit: Payer: Medicare PPO | Attending: Nurse Practitioner | Admitting: Nurse Practitioner

## 2022-11-03 ENCOUNTER — Encounter: Payer: Self-pay | Admitting: Nurse Practitioner

## 2022-11-03 VITALS — BP 122/68 | HR 53 | Ht 63.0 in | Wt 214.0 lb

## 2022-11-03 DIAGNOSIS — E785 Hyperlipidemia, unspecified: Secondary | ICD-10-CM

## 2022-11-03 DIAGNOSIS — I639 Cerebral infarction, unspecified: Secondary | ICD-10-CM

## 2022-11-03 DIAGNOSIS — H47019 Ischemic optic neuropathy, unspecified eye: Secondary | ICD-10-CM | POA: Diagnosis not present

## 2022-11-03 DIAGNOSIS — I10 Essential (primary) hypertension: Secondary | ICD-10-CM

## 2022-11-03 DIAGNOSIS — R42 Dizziness and giddiness: Secondary | ICD-10-CM

## 2022-11-03 MED ORDER — ROSUVASTATIN CALCIUM 10 MG PO TABS: 10.0000 mg | ORAL_TABLET | Freq: Every day | ORAL | 3 refills | Status: DC

## 2022-11-03 NOTE — Patient Instructions (Addendum)
Medication Instructions:  Your physician recommends that you continue on your current medications as directed. Please refer to the Current Medication list given to you today.  *If you need a refill on your cardiac medications before your next appointment, please call your pharmacy*   Lab Work: None  If you have labs (blood work) drawn today and your tests are completely normal, you will receive your results only by: MyChart Message (if you have MyChart) OR A paper copy in the mail If you have any lab test that is abnormal or we need to change your treatment, we will call you to review the results.   Testing/Procedures: None   Follow-Up: At [facility], you and your health needs are our priority.  As part of our continuing mission to provide you with exceptional heart care, we have created designated Provider Care Teams.  These Care Teams include your primary Cardiologist (physician) and Advanced Practice Providers (APPs -  Physician Assistants and Nurse Practitioners) who all work together to provide you with the care you need, when you need it.  We recommend signing up for the patient portal called "MyChart".  Sign up information is provided on this After Visit Summary.  MyChart is used to connect with patients for Virtual Visits (Telemedicine).  Patients are able to view lab/test results, encounter notes, upcoming appointments, etc.  Non-urgent messages can be sent to your provider as well.   To learn more about what you can do with MyChart, go to [email or web address].    Your next appointment:   1 month(s)  Provider:   Steffanie Dunn, MD    Other Instructions Please look into Kardia Monitor.   HOW TO TAKE YOUR BLOOD PRESSURE  Rest 5 minutes before taking your blood pressure. Don't  smoke or drink caffeinated beverages for at least 30 minutes before. Take your blood pressure before (not after) you eat. Sit comfortably with your back supported and both feet on  the floor ( don't cross your legs). Elevate your arm to heart level on a table or a desk. Use the proper sized cuff.  It should fit smoothly and snugly around your bare upper arm.  There should be  Enough room to slip a fingertip under the cuff.  The bottom edge of the cuff should be 1 inch above the crease Of the elbow. Please monitor your blood pressure once daily 2 hours after your am medication. If you blood pressure Consistently remains above 140 (systolic) top number or over 80 ( diastolic) bottom number X 3 days  Consecutively.  Please call our office at [phone] or send Mychart message.     ----Avoid cold medicines with D or DM at the end of them----

## 2022-11-24 ENCOUNTER — Telehealth: Payer: Self-pay | Admitting: Cardiology

## 2022-11-24 DIAGNOSIS — I10 Essential (primary) hypertension: Secondary | ICD-10-CM | POA: Diagnosis not present

## 2022-11-24 NOTE — Telephone Encounter (Signed)
Pt c/o BP issue: STAT if pt c/o blurred vision, one-sided weakness or slurred speech  1. What are your last 5 BP readings? 12:30 PM 162/102 ten minutes 177/84  2. Are you having any other symptoms (ex. Dizziness, headache, blurred vision, passed out)? Light headache (few days ago)  3. What is your BP issue? Patient called stating that her BP has been high and she's been feeling very tired lately. The patient's daughter was there and stated that she checked the patients BP and saw it was high. The patient's daughter waited ten minutes before taking it again and saw that it was going up. Please advise.

## 2022-11-24 NOTE — Telephone Encounter (Signed)
Called patient back about her message. Patient stated her BP has been elevated today, 162/102 and 177/84. HR 55 to 60. Patient stated she is feeling tired and emotional. Asked patient if she has been taking her Telmisartan. Patient stated she has not taken her Telmisartan yet today. Encouraged patient to take her Telmisartan and check her BP in 2 hours and see if it has gone down. Patient also stated that she was taking her Telmisartan at night, but was having some GERD, so she changed to taking it in the day time. Patient also stated she has been spitting up small amounts of blood after brushing her teeth. Patient stated it was not from her gums, but her throat. Encouraged patient to call her PCP about her symptoms. Encouraged patient to keep a log of BP's and a message would be sent to Dr. Mayford Knife for further advisement.

## 2022-11-25 NOTE — Telephone Encounter (Signed)
Called patient back with Dr. Norris Cross advisement. Per Dr. Mayford Knife, Needs to see PCP for spitting up blood.  Also check BP twice daily for a week and call with results. Patient stated she saw urgent care yesterday evening and was evaluated. Patient stated her BP came down and they took a look at her throat. Patient will keep track of her BP's twice daily and call us in a week with readings.

## 2022-11-29 ENCOUNTER — Telehealth: Payer: Self-pay | Admitting: Cardiology

## 2022-11-29 ENCOUNTER — Ambulatory Visit: Payer: Medicare PPO

## 2022-11-29 DIAGNOSIS — M76822 Posterior tibial tendinitis, left leg: Secondary | ICD-10-CM

## 2022-11-29 NOTE — Telephone Encounter (Signed)
Pt was contacted to r/s 8/19 to 9/5 d/t provider closing clinic. Pt states that she is wondering if she should go through with the implant, as she is going to proceed with reconstructive surgery from her mastectomy. Went ahead and scheduled pt for 9/5 incase it is ok to do both. Please advise.

## 2022-11-29 NOTE — Progress Notes (Signed)
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   Orthotics were dispensed and fit was satisfactory. Reviewed instructions for break-in and wear. Written instructions given to patient.  Patient will follow up as needed.   Addison Bailey Cped, CFo, CFm

## 2022-11-30 NOTE — Telephone Encounter (Signed)
Spoke with the patient and she does not currently have a date for her reconstructive surgery. She is going to be reaching out to ger surgeon's office today. We will hold off on her loop recorder implant until after surgery. She will call us back to reschedule her appointment with Dr.Lambert once she knows when her surgery will be.

## 2022-12-12 ENCOUNTER — Ambulatory Visit: Payer: Medicare PPO | Admitting: Cardiology

## 2022-12-29 ENCOUNTER — Ambulatory Visit: Payer: Medicare PPO | Admitting: Cardiology

## 2022-12-29 DIAGNOSIS — K603 Anal fistula: Secondary | ICD-10-CM | POA: Diagnosis not present

## 2023-01-02 ENCOUNTER — Ambulatory Visit: Payer: Self-pay | Admitting: Surgery

## 2023-01-02 DIAGNOSIS — Z01812 Encounter for preprocedural laboratory examination: Secondary | ICD-10-CM | POA: Diagnosis not present

## 2023-01-03 ENCOUNTER — Encounter (HOSPITAL_BASED_OUTPATIENT_CLINIC_OR_DEPARTMENT_OTHER): Payer: Self-pay | Admitting: Surgery

## 2023-01-03 NOTE — Progress Notes (Signed)
Spoke w/ via phone for pre-op interview--- pt Lab needs dos---- [employer] results------ current EKG in epic/ chart COVID test -----patient states asymptomatic no test needed Arrive at ------- 1330 on 01-09-2023 NPO after MN NO Solid Food.  Clear liquids from MN until--- 1000 Med rec completed Medications to take morning of surgery ----- none Diabetic medication ----- n/a Patient instructed no nail polish to be worn day of surgery Patient instructed to bring photo id and insurance card day of surgery Patient aware to have Driver (ride ) / caregiver    for 24 hours after surgery -- husband, Tammy Cooper Patient Special Instructions ----- pt verbalized understanding to do one fleet enema morning of surgery Pre-Op special Instructions ----- n/a Patient verbalized understanding of instructions that were given at this phone interview. Patient denies shortness of breath, chest pain, fever, cough at this phone interview.

## 2023-01-06 ENCOUNTER — Encounter (HOSPITAL_BASED_OUTPATIENT_CLINIC_OR_DEPARTMENT_OTHER): Payer: Self-pay | Admitting: Surgery

## 2023-01-09 ENCOUNTER — Encounter (HOSPITAL_BASED_OUTPATIENT_CLINIC_OR_DEPARTMENT_OTHER)
Admission: RE | Disposition: A | Discharge status: Home / Self Care | Payer: Self-pay | Source: Home / Self Care | Attending: Surgery

## 2023-01-09 ENCOUNTER — Encounter (HOSPITAL_BASED_OUTPATIENT_CLINIC_OR_DEPARTMENT_OTHER): Payer: Self-pay | Admitting: Surgery

## 2023-01-09 ENCOUNTER — Other Ambulatory Visit: Payer: Self-pay

## 2023-01-09 ENCOUNTER — Ambulatory Visit (HOSPITAL_BASED_OUTPATIENT_CLINIC_OR_DEPARTMENT_OTHER): Payer: Medicare PPO | Admitting: Certified Registered"

## 2023-01-09 ENCOUNTER — Ambulatory Visit (HOSPITAL_BASED_OUTPATIENT_CLINIC_OR_DEPARTMENT_OTHER)
Admission: RE | Admit: 2023-01-09 | Discharge: 2023-01-09 | Disposition: A | Discharge status: Home / Self Care | Payer: Medicare PPO | Attending: Surgery | Admitting: Surgery

## 2023-01-09 DIAGNOSIS — Z6838 Body mass index (BMI) 38.0-38.9, adult: Secondary | ICD-10-CM | POA: Insufficient documentation

## 2023-01-09 DIAGNOSIS — Z79899 Other long term (current) drug therapy: Secondary | ICD-10-CM | POA: Diagnosis not present

## 2023-01-09 DIAGNOSIS — Z01818 Encounter for other preprocedural examination: Secondary | ICD-10-CM

## 2023-01-09 DIAGNOSIS — E785 Hyperlipidemia, unspecified: Secondary | ICD-10-CM | POA: Diagnosis not present

## 2023-01-09 DIAGNOSIS — I739 Peripheral vascular disease, unspecified: Secondary | ICD-10-CM | POA: Diagnosis not present

## 2023-01-09 DIAGNOSIS — I6521 Occlusion and stenosis of right carotid artery: Secondary | ICD-10-CM | POA: Insufficient documentation

## 2023-01-09 DIAGNOSIS — Z8673 Personal history of transient ischemic attack (TIA), and cerebral infarction without residual deficits: Secondary | ICD-10-CM | POA: Diagnosis not present

## 2023-01-09 DIAGNOSIS — Z853 Personal history of malignant neoplasm of breast: Secondary | ICD-10-CM | POA: Diagnosis not present

## 2023-01-09 DIAGNOSIS — E0590 Thyrotoxicosis, unspecified without thyrotoxic crisis or storm: Secondary | ICD-10-CM | POA: Diagnosis not present

## 2023-01-09 DIAGNOSIS — I708 Atherosclerosis of other arteries: Secondary | ICD-10-CM | POA: Insufficient documentation

## 2023-01-09 DIAGNOSIS — K648 Other hemorrhoids: Secondary | ICD-10-CM | POA: Insufficient documentation

## 2023-01-09 DIAGNOSIS — I10 Essential (primary) hypertension: Secondary | ICD-10-CM | POA: Insufficient documentation

## 2023-01-09 DIAGNOSIS — L290 Pruritus ani: Secondary | ICD-10-CM | POA: Diagnosis not present

## 2023-01-09 HISTORY — DX: Unspecified osteoarthritis, unspecified site: M19.90

## 2023-01-09 HISTORY — DX: Anal fistula: K60.3

## 2023-01-09 HISTORY — DX: Anal fistula, unspecified: K60.30

## 2023-01-09 HISTORY — DX: Hyperlipidemia, unspecified: E78.5

## 2023-01-09 LAB — POCT I-STAT, CHEM 8
BUN: 8 mg/dL (ref 8–23)
Calcium, Ion: 1.26 mmol/L (ref 1.15–1.40)
Chloride: 104 mmol/L (ref 98–111)
Creatinine, Ser: 0.7 mg/dL (ref 0.44–1.00)
Glucose, Bld: 98 mg/dL (ref 70–99)
HCT: 41 % (ref 36.0–46.0)
Hemoglobin: 13.9 g/dL (ref 12.0–15.0)
Potassium: 3.9 mmol/L (ref 3.5–5.1)
Sodium: 141 mmol/L (ref 135–145)
TCO2: 24 mmol/L (ref 22–32)

## 2023-01-09 SURGERY — EXAM UNDER ANESTHESIA
Anesthesia: Monitor Anesthesia Care | Site: Rectum

## 2023-01-09 MED ORDER — ONDANSETRON HCL 4 MG/2ML IJ SOLN
INTRAMUSCULAR | Status: AC
  Filled 2023-01-09: qty 2

## 2023-01-09 MED ORDER — OXYCODONE HCL 5 MG/5ML PO SOLN: 5.0000 mg | Freq: Once | ORAL | Status: DC | PRN

## 2023-01-09 MED ORDER — MIDAZOLAM HCL 5 MG/5ML IJ SOLN
INTRAMUSCULAR | Status: DC | PRN
  Administered 2023-01-09: 2 mg via INTRAVENOUS

## 2023-01-09 MED ORDER — ACETAMINOPHEN 500 MG PO TABS: 1000.0000 mg | ORAL_TABLET | ORAL | Status: DC

## 2023-01-09 MED ORDER — BUPIVACAINE LIPOSOME 1.3 % IJ SUSP: 20.0000 mL | Freq: Once | INTRAMUSCULAR | Status: DC

## 2023-01-09 MED ORDER — GLYCOPYRROLATE PF 0.2 MG/ML IJ SOSY
PREFILLED_SYRINGE | INTRAMUSCULAR | Status: AC
  Filled 2023-01-09: qty 1

## 2023-01-09 MED ORDER — EPHEDRINE 5 MG/ML INJ
INTRAVENOUS | Status: AC
  Filled 2023-01-09: qty 5

## 2023-01-09 MED ORDER — EPHEDRINE SULFATE (PRESSORS) 50 MG/ML IJ SOLN
INTRAMUSCULAR | Status: DC | PRN
  Administered 2023-01-09: 10 mg via INTRAVENOUS

## 2023-01-09 MED ORDER — DEXAMETHASONE SODIUM PHOSPHATE 4 MG/ML IJ SOLN
INTRAMUSCULAR | Status: DC | PRN
  Administered 2023-01-09: 5 mg via INTRAVENOUS

## 2023-01-09 MED ORDER — FENTANYL CITRATE (PF) 100 MCG/2ML IJ SOLN: 25.0000 ug | INTRAMUSCULAR | Status: DC | PRN

## 2023-01-09 MED ORDER — FENTANYL CITRATE (PF) 100 MCG/2ML IJ SOLN
INTRAMUSCULAR | Status: DC | PRN
  Administered 2023-01-09: 25 ug via INTRAVENOUS

## 2023-01-09 MED ORDER — BUPIVACAINE-EPINEPHRINE (PF) 0.25% -1:200000 IJ SOLN
INTRAMUSCULAR | Status: DC | PRN
  Administered 2023-01-09: 20 mL

## 2023-01-09 MED ORDER — PROPOFOL 1000 MG/100ML IV EMUL
INTRAVENOUS | Status: AC
  Filled 2023-01-09: qty 100

## 2023-01-09 MED ORDER — OXYCODONE HCL 5 MG PO TABS: 5.0000 mg | ORAL_TABLET | Freq: Once | ORAL | Status: DC | PRN

## 2023-01-09 MED ORDER — FLEET ENEMA RE ENEM: 1.0000 | ENEMA | Freq: Once | RECTAL | Status: DC

## 2023-01-09 MED ORDER — ACETAMINOPHEN 500 MG PO TABS
1000.0000 mg | ORAL_TABLET | Freq: Once | ORAL | Status: AC
  Administered 2023-01-09: 1000 mg via ORAL

## 2023-01-09 MED ORDER — ACETAMINOPHEN 500 MG PO TABS
ORAL_TABLET | ORAL | Status: AC
  Filled 2023-01-09: qty 2

## 2023-01-09 MED ORDER — KETOROLAC TROMETHAMINE 30 MG/ML IJ SOLN
INTRAMUSCULAR | Status: DC | PRN
  Administered 2023-01-09: 30 mg via INTRAVENOUS

## 2023-01-09 MED ORDER — GLYCOPYRROLATE 0.2 MG/ML IJ SOLN
INTRAMUSCULAR | Status: DC | PRN
  Administered 2023-01-09: .2 mg via INTRAVENOUS

## 2023-01-09 MED ORDER — ONDANSETRON HCL 4 MG/2ML IJ SOLN
INTRAMUSCULAR | Status: DC | PRN
  Administered 2023-01-09: 4 mg via INTRAVENOUS

## 2023-01-09 MED ORDER — FENTANYL CITRATE (PF) 100 MCG/2ML IJ SOLN
INTRAMUSCULAR | Status: AC
  Filled 2023-01-09: qty 2

## 2023-01-09 MED ORDER — KETOROLAC TROMETHAMINE 30 MG/ML IJ SOLN
INTRAMUSCULAR | Status: AC
  Filled 2023-01-09: qty 1

## 2023-01-09 MED ORDER — MIDAZOLAM HCL 2 MG/2ML IJ SOLN
INTRAMUSCULAR | Status: AC
  Filled 2023-01-09: qty 2

## 2023-01-09 MED ORDER — PROPOFOL 500 MG/50ML IV EMUL
INTRAVENOUS | Status: DC | PRN
  Administered 2023-01-09: 100 ug/kg/min via INTRAVENOUS

## 2023-01-09 MED ORDER — DEXAMETHASONE SODIUM PHOSPHATE 10 MG/ML IJ SOLN
INTRAMUSCULAR | Status: AC
  Filled 2023-01-09: qty 1

## 2023-01-09 MED ORDER — ONDANSETRON HCL 4 MG/2ML IJ SOLN: 4.0000 mg | Freq: Once | INTRAMUSCULAR | Status: DC | PRN

## 2023-01-09 MED ORDER — 0.9 % SODIUM CHLORIDE (POUR BTL) OPTIME
TOPICAL | Status: DC | PRN
Start: 2023-01-09 — End: 2023-01-09
  Administered 2023-01-09: 500 mL

## 2023-01-09 MED ORDER — LACTATED RINGERS IV SOLN: INTRAVENOUS | Status: DC

## 2023-01-09 MED ORDER — LIDOCAINE HCL (CARDIAC) PF 100 MG/5ML IV SOSY
PREFILLED_SYRINGE | INTRAVENOUS | Status: DC | PRN
  Administered 2023-01-09: 60 mg via INTRAVENOUS

## 2023-01-09 MED ORDER — LIDOCAINE HCL (PF) 2 % IJ SOLN
INTRAMUSCULAR | Status: AC
  Filled 2023-01-09: qty 5

## 2023-01-09 SURGICAL SUPPLY — 29 items
COVER BACK TABLE 60X90IN (DRAPES) ×2 IMPLANT
DRAPE HYSTEROSCOPY (MISCELLANEOUS) ×1 IMPLANT
DRAPE SHEET LG 3/4 BI-LAMINATE (DRAPES) ×1 IMPLANT
GAUZE 4X4 16PLY ~~LOC~~+RFID DBL (SPONGE) ×2 IMPLANT
GAUZE PAD ABD 8X10 STRL (GAUZE/BANDAGES/DRESSINGS) ×2 IMPLANT
GAUZE SPONGE 4X4 12PLY STRL (GAUZE/BANDAGES/DRESSINGS) IMPLANT
GLOVE BIO SURGEON STRL SZ7.5 (GLOVE) ×2 IMPLANT
GLOVE BIOGEL PI IND STRL 7.0 (GLOVE) ×1 IMPLANT
GLOVE INDICATOR 8.0 STRL GRN (GLOVE) ×2 IMPLANT
GLOVE SURG SS PI 6.5 STRL IVOR (GLOVE) ×1 IMPLANT
GOWN STRL REUS W/ TWL LRG LVL3 (GOWN DISPOSABLE) ×1 IMPLANT
GOWN STRL REUS W/TWL LRG LVL3 (GOWN DISPOSABLE) ×2
GOWN STRL REUS W/TWL XL LVL3 (GOWN DISPOSABLE) ×2 IMPLANT
KIT TURNOVER CYSTO (KITS) ×2 IMPLANT
LEGGING LITHOTOMY PAIR STRL (DRAPES) ×1 IMPLANT
NDL HYPO 22X1.5 SAFETY MO (MISCELLANEOUS) ×1 IMPLANT
NEEDLE HYPO 22X1.5 SAFETY MO (MISCELLANEOUS) ×2
NS IRRIG 500ML POUR BTL (IV SOLUTION) ×2 IMPLANT
PACK BASIN DAY SURGERY FS (CUSTOM PROCEDURE TRAY) ×2 IMPLANT
PENCIL SMOKE EVACUATOR (MISCELLANEOUS) ×2 IMPLANT
SLEEVE SCD COMPRESS KNEE MED (STOCKING) ×2 IMPLANT
SOL PREP POV-IOD 4OZ 10% (MISCELLANEOUS) ×1 IMPLANT
SPIKE FLUID TRANSFER (MISCELLANEOUS) ×2 IMPLANT
SYR BULB IRRIG 60ML STRL (SYRINGE) ×2 IMPLANT
SYR CONTROL 10ML LL (SYRINGE) ×2 IMPLANT
TOWEL OR 17X24 6PK STRL BLUE (TOWEL DISPOSABLE) ×2 IMPLANT
TRAY DSU PREP LF (CUSTOM PROCEDURE TRAY) ×2 IMPLANT
TUBE CONNECTING 12X1/4 (SUCTIONS) ×2 IMPLANT
YANKAUER SUCT BULB TIP NO VENT (SUCTIONS) ×2 IMPLANT

## 2023-01-09 NOTE — Discharge Instructions (Signed)
     No acetaminophen/Tylenol until after 7:00 pm today if needed.  No ibuprofen, Advil, Aleve, Motrin, ketorolac, meloxicam, naproxen, or other NSAIDS until after 8:45 pm today if needed.     Post Anesthesia Home Care Instructions  Activity: Get plenty of rest for the remainder of the day. A responsible individual must stay with you for 24 hours following the procedure.  For the next 24 hours, DO NOT: -Drive a car -Advertising copywriter -Drink alcoholic beverages -Take any medication unless instructed by your physician -Make any legal decisions or sign important papers.  Meals: Start with liquid foods such as gelatin or soup. Progress to regular foods as tolerated. Avoid greasy, spicy, heavy foods. If nausea and/or vomiting occur, drink only clear liquids until the nausea and/or vomiting subsides. Call your physician if vomiting continues.  Special Instructions/Symptoms: Your throat may feel dry or sore from the anesthesia or the breathing tube placed in your throat during surgery. If this causes discomfort, gargle with warm salt water. The discomfort should disappear within 24 hours.

## 2023-01-09 NOTE — Op Note (Signed)
01/09/2023  2:47 PM  PATIENT:  Tammy Cooper  72 y.o. female  Patient Care Team: Deatra James, MD as PCP - General (Family Medicine) Quintella Reichert, MD as PCP - Cardiology (Cardiology) Lanier Prude, MD as PCP - Electrophysiology (Cardiology) Quintella Reichert, MD as Consulting Physician (Cardiology) Serena Croissant, MD as Consulting Physician (Hematology and Oncology) Abigail Miyamoto, MD as Consulting Physician (General Surgery)  PRE-OPERATIVE DIAGNOSIS:  Anal itching, possible anal fistula posterior midline  POST-OPERATIVE DIAGNOSIS:  Anal itching, 3 column internal hemorrhoids - left lateral, right posterior, right anterior  PROCEDURE:   Anorectal exam under anesthesia  SURGEON:  Surgeon(s): Andria Meuse, MD  ASSISTANT: OR staff   ANESTHESIA:   local and MAC  SPECIMEN:  No Specimen  DISPOSITION OF SPECIMEN:  N/A  COUNTS:  Sponge, needle, and instrument counts were reported correct x2 at conclusion.  EBL: 0 mL  PLAN OF CARE: Discharge to home after PACU  PATIENT DISPOSITION:  PACU - hemodynamically stable.  OR FINDINGS: Within the posterior midline at the anal os, there is a superficial tear in the skin but with well-formed dermis underneath and no evident fistula.  This area was also probed with our fistula probe and there is no thinning of skin or any evident sinus/fistulous tract. She does have 3 columns of internal hemorrhoids, most notable in the right posterior and left lateral position.  Small internal hemorrhoids in the right anterior position.  Given this is the only pertinent finding, it is possible that her perianal itching that she was experiencing is potentially related to hemorrhoidal prolapse and mucus.  DESCRIPTION: The patient was identified in the preoperative holding area and taken to the OR where she was placed on the operating room table. SCDs were placed.  MAC anesthesia was induced without difficulty. The patient was then  positioned in high lithotomy with Allen stirrups. Pressure points were then evaluated and padded.  She was then prepped and draped in usual sterile fashion.  A surgical timeout was performed indicating the correct patient, procedure, and positioning.  A perianal block was performed using a dilute mixture of 0.25% Marcaine with epinephrine and Exparel.   After ascertaining that an appropriate level of anesthesia had been achieved, a well lubricated digital rectal exam was performed. This demonstrated no palpable abnormalities.  A Hill-Ferguson anoscope was into the anal canal and circumferential inspection demonstrated healthy appearing anoderm with 3 columns of internal hemorrhoidal tissue.  Most notable in the right posterior and left lateral position.  Smaller internal hemorrhoids in the right anterior position.  There is some apparent thinning of the skin in the posterior midline at the anal os as noted in our office visit.  That said, it is at the level of the epidermis and does not track any deeper.  There is intact/well-formed dermis and no evident sinus or any clear anal fistulas.  There is no drainage from this.  I suspect this may have been secondary to trauma to the area as opposed to any sort of fistula.  Given these constellation of findings, it is most likely that her itching may be most attributable to her hemorrhoidal prolapse that she is likely experiencing based on what we saw today.  We were not prepared for a hemorrhoidectomy today as she is in lithotomy and under MAC 2/2 the findings in our office visit but would be worth considering in the future if her symptoms persist despite attempts at medical management.  The anal canal was then  reinspected.  Hemostasis is verified.  All sponge, needle, and instrument counts are reported correct.  She was then taken out of the lithotomy position and awakened from anesthesia where she was transferred to a stretcher for transport to recovery in  satisfactory condition.  DISPOSITION: PACU in satisfactory condition.

## 2023-01-09 NOTE — Transfer of Care (Signed)
Immediate Anesthesia Transfer of Care Note  Patient: Tammy Cooper  Procedure(s) Performed: Procedure(s) (LRB): EXAM UNDER ANESTHESIA (N/A)  Patient Location: PACU  Anesthesia Type: MAC  Level of Consciousness: awake, sedated, patient cooperative and responds to stimulation  Airway & Oxygen Therapy: Patient Spontanous Breathing and Patient on RA  Post-op Assessment: Report given to PACU RN, Post -op Vital signs reviewed and stable and Patient moving all extremities  Post vital signs: Reviewed and stable  Complications: No apparent anesthesia complicatio

## 2023-01-09 NOTE — Anesthesia Preprocedure Evaluation (Addendum)
Anesthesia Evaluation  Patient identified by MRN, date of birth, ID band Patient awake    Reviewed: Allergy & Precautions, NPO status , Patient's Chart, lab work & pertinent test results  History of Anesthesia Complications (+) PONV and history of anesthetic complications  Airway Mallampati: II  TM Distance: >3 FB Neck ROM: Full    Dental  (+) Dental Advisory Given, Caps   Pulmonary neg pulmonary ROS   Pulmonary exam normal        Cardiovascular hypertension, Pt. on medications + Peripheral Vascular Disease  Normal cardiovascular exam   1-39% right ICA stenosis and left subclavian stenosis  '22 TTE - EF 65 to 70%. Grade II diastolic dysfunction (pseudonormalization). Left atrial size was mildly dilated. No significant valve d/o     Neuro/Psych  PSYCHIATRIC DISORDERS Anxiety Depression    TIA   GI/Hepatic negative GI ROS, Neg liver ROS,,,  Endo/Other    Morbid obesity  Renal/GU negative Renal ROS     Musculoskeletal  (+) Arthritis , Osteoarthritis,    Abdominal   Peds  Hematology negative hematology ROS (+)   Anesthesia Other Findings   Reproductive/Obstetrics  Breast cancer                              Anesthesia Physical Anesthesia Plan  ASA: 3  Anesthesia Plan: MAC   Post-op Pain Management: Tylenol PO (pre-op)*   Induction:   PONV Risk Score and Plan: 3 and Propofol infusion and Treatment may vary due to age or medical condition  Airway Management Planned: Natural Airway and Simple Face Mask  Additional Equipment: None  Intra-op Plan:   Post-operative Plan:   Informed Consent: I have reviewed the patients History and Physical, chart, labs and discussed the procedure including the risks, benefits and alternatives for the proposed anesthesia with the patient or authorized representative who has indicated his/her understanding and acceptance.       Plan Discussed  with: CRNA and Anesthesiologist  Anesthesia Plan Comments:         Anesthesia Quick Evaluation

## 2023-01-09 NOTE — Anesthesia Procedure Notes (Signed)
Procedure Name: MAC Date/Time: 01/09/2023 2:20 PM  Performed by: Jessica Priest, CRNAPre-anesthesia Checklist: Timeout performed, Patient being monitored, Suction available, Emergency Drugs available and Patient identified Patient Re-evaluated:Patient Re-evaluated prior to induction Oxygen Delivery Method: Simple face mask Preoxygenation: Pre-oxygenation with 100% oxygen Induction Type: IV induction Placement Confirmation: breath sounds checked- equal and bilateral, CO2 detector and positive ETCO2

## 2023-01-09 NOTE — H&P (Signed)
CC: Here today for surgery  HPI: Tammy Cooper is an 72 y.o. female with history of HLD, whom is seen in the office today for evaluation of possible hemorrhoids.  She follows with gastroenterology, Dr. Pati Gallo, and reports that she had a colonoscopy completed with [facility] in the last 5 or so months. She reports that she was told she had some smaller hemorrhoids.  History of removal of anal skin tag with Dr. Magnus Ivan, 09/2010.  Tag was located in the anterior midline.  She denies any other anorectal surgery or procedures.  She reports that currently, over the last few months she has had some issues primarily with perianal itching. She will have some occasional bright red blood per rectum. She denies any significant pain with defecation. She also denies any known history of anal fissures or severe sharp-like sensations with passage of stool.  She reports she has a bowel movement every 2 to 3 days on average. Variable in consistency. She drinks in excess of 6 to 8 glasses of water per day. She spends approximately 5 minutes on the commode with a bowel movement. She is not currently taking any fiber supplements, stool softeners, nor laxatives.  PMH: HLD, breast lesions  PSH: Excision of anterior midline perianal tag, 2012-Dr. Magnus Ivan. Path - acrochordon  FHx: Denies any known family history of colorectal, breast, endometrial or ovarian cancer  Social Hx: Denies use of tobacco/EtOH/illicit drug. 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   She denies any changes in health or health history since we met in the office. No new medications/allergies. She states she is ready for surgery today.  Past Medical History:  Diagnosis Date   Anal fistula    Anxiety    Carotid stenosis, asymptomatic, right    1-39% right ICA stenosis and left subclavian stenosis   Complication of anesthesia    Depression    History of left  breast cancer 2016   oncologist--- dr Pamelia Hoit;  dx 04/ 2016  DCIS HG, ER/PR +;  s/p left breast lumpectomy 07-31-2014,  completed radiation 10-31-2014, completed 5 yrs antiestrogen   History of recurrent cystitis    urologist--- dr pace   History of stroke 09/2020   ocular cryptogenic stroke / cardioembolic followed by [facility] released 06/ 2023  to local eye doctor   History of transient ischemic attack (TIA) 2006   no residual   Hyperlipidemia    Hypertension    Malignant neoplasm of upper-outer quadrant of left breast in female, estrogen receptor negative (HCC) 02/2021   recurrence from previous left breast 2016;  oncologist--- dr Pamelia Hoit  ;   04-07-2021  s/p  bilateral masectomy,  IDC/  DCIS  ER/PR negative, Stage 1A   OA (osteoarthritis)    PONV (postoperative nausea and vomiting)    Spinal stenosis     Past Surgical History:  Procedure Laterality Date   ANAL EXAMINATION UNDER ANESTHESIA  10/21/2010   @[facility]  by dr d. Magnus Ivan;  excision anal skin tag   BREAST LUMPECTOMY WITH RADIOACTIVE SEED LOCALIZATION Left 07/31/2014   Procedure: LEFT BREAST LUMPECTOMY WITH RADIOACTIVE SEED LOCALIZATION;  Surgeon: Abigail Miyamoto, MD;  Location: [facility];  Service: General;  Laterality: Left;   COLONOSCOPY  07/2022   [facility]  dr Cathrine Muster     KNEE ARTHROSCOPY Left 05/07/2008   @[facility]  by dr Lequita Halt   LAPAROSCOPIC VAGINAL HYSTERECTOMY WITH SALPINGO OOPHORECTOMY  07/02/2001   @  [facility] by dr Senaida Ores ;   anterior & posterior repair/ dr Vernie Ammons did tension free vaginal tape procedure   POSTERIOR FUSION LUMBAR SPINE  07/12/2010   @[facility]  by dr Danielle Dess;  L4--5   SHOULDER ARTHROSCOPY DISTAL CLAVICLE EXCISION AND OPEN ROTATOR CUFF REPAIR Right 07/19/2000   @[facility]  by dr Sherlean Foot   TONSILLECTOMY AND ADENOIDECTOMY  1958   TOTAL KNEE ARTHROPLASTY Right 08/04/2015   Procedure: RIGHT TOTAL KNEE ARTHROPLASTY;  Surgeon: Durene Romans, MD;  Location: [facility];  Service: Orthopedics;   Laterality: Right;   TOTAL KNEE ARTHROPLASTY Left 12/01/2015   Procedure: LEFT TOTAL KNEE ARTHROPLASTY;  Surgeon: Durene Romans, MD;  Location: [facility];  Service: Orthopedics;  Laterality: Left;   TOTAL MASTECTOMY Bilateral 04/07/2021   Procedure: BILATERAL TOTAL MASTECTOMY;  Surgeon: Abigail Miyamoto, MD;  Location: [facility];  Service: General;  Laterality: Bilateral;    Family History  Problem Relation Age of Onset   Cancer Father        lung   Hypertension Mother    CAD Brother        s/p PCI   Heart attack Neg Hx     Social:  reports that she has never smoked. She has never used smokeless tobacco. She reports current alcohol use. She reports that she does not use drugs.  Allergies:  Allergies  Allergen Reactions   Lipitor [Atorvastatin]     Muscle aches   Other Hives    PATIENT ALLERGIC TO SURGICAL GLUE    Medications: I have reviewed the patient's current medications.  Results for orders placed or performed during the hospital encounter of 01/09/23 (from the past 48 hour(s))  I-STAT, chem 8     Status: None   Collection Time: 01/09/23 12:58 PM  Result Value Ref Range   Sodium 141 135 - 145 mmol/L   Potassium 3.9 3.5 - 5.1 mmol/L   Chloride 104 98 - 111 mmol/L   BUN 8 8 - 23 mg/dL   Creatinine, Ser 1.32 0.44 - 1.00 mg/dL   Glucose, Bld 98 70 - 99 mg/dL    Comment: Glucose reference range applies only to samples taken after fasting for at least 8 hours.   Calcium, Ion 1.26 1.15 - 1.40 mmol/L   TCO2 24 22 - 32 mmol/L   Hemoglobin 13.9 12.0 - 15.0 g/dL   HCT 44.0 10.2 - 72.5 %    No results found.   PE Blood pressure (!) 162/62, pulse 72, temperature 97.8 F (36.6 C), temperature source Oral, resp. rate 18, height 5\' 3"  (1.6 m), weight 97.5 kg, SpO2 92%. Constitutional: NAD; conversant Eyes: Moist conjunctiva; no lid lag; anicteric Lungs: Normal respiratory effort CV: RRR Psychiatric: Appropriate affect  Results for orders placed or  performed during the hospital encounter of 01/09/23 (from the past 48 hour(s))  I-STAT, chem 8     Status: None   Collection Time: 01/09/23 12:58 PM  Result Value Ref Range   Sodium 141 135 - 145 mmol/L   Potassium 3.9 3.5 - 5.1 mmol/L   Chloride 104 98 - 111 mmol/L   BUN 8 8 - 23 mg/dL   Creatinine, Ser 3.66 0.44 - 1.00 mg/dL   Glucose, Bld 98 70 - 99 mg/dL    Comment: Glucose reference range applies only to samples taken after fasting for at least 8 hours.   Calcium, Ion 1.26 1.15 - 1.40 mmol/L   TCO2 24 22 - 32 mmol/L   Hemoglobin 13.9 12.0 - 15.0 g/dL  HCT 41.0 36.0 - 46.0 %    No results found.  A/P: Tammy Cooper is an 72 y.o. female with hx of HLD, breast lesions here for surgery today for what clinically appears most consistent with an anal fistula.  -The anatomy and physiology of the anal canal was discussed with the patient with associated pictures. The pathophysiology of an using the ASCRS handout on anal abscess and fistula. The pathophysiology of perianal abscess/fistula was discussed at length with associated pictures and illustrations  -We have reviewed options going forward including further observation vs surgery -surgical treatment of anal fistula with possible fistulotomy versus less likely placement of draining seton; anorectal exam under anesthesia. -The planned procedure, material risks (including, but not limited to, pain, bleeding, infection, scarring, need for blood transfusion, damage to anal sphincter, incontinence of gas and/or stool, need for additional procedures, anal stenosis, rare cases of pelvic sepsis which in severe cases may require things like a colostomy, recurrence, pneumonia, heart attack, stroke, death) benefits and alternatives to surgery were discussed at length. I noted a good probability that the procedure would help improve their symptoms. The patient's questions were answered to her satisfaction, she voiced understanding and elected  to proceed with surgery. Additionally, we discussed typical postoperative expectations and the recovery process.  Marin Olp, MD [facility], [facility]

## 2023-01-09 NOTE — Anesthesia Postprocedure Evaluation (Signed)
Anesthesia Post Note  Patient: Tammy Cooper  Procedure(s) Performed: EXAM UNDER ANESTHESIA (Rectum)     Patient location during evaluation: PACU Anesthesia Type: MAC Level of consciousness: awake and alert Pain management: pain level controlled Vital Signs Assessment: post-procedure vital signs reviewed and stable Respiratory status: spontaneous breathing, nonlabored ventilation, respiratory function stable and patient connected to nasal cannula oxygen Cardiovascular status: blood pressure returned to baseline and stable Postop Assessment: no apparent nausea or vomiting Anesthetic complications: no   No notable events documented.  Last Vitals:  Vitals:   01/09/23 1500 01/09/23 1515  BP: (!) 117/46 (!) 119/42  Pulse: 72 64  Resp: 17 20  Temp: (!) 36.1 C   SpO2: (!) 89% 98%    Last Pain:  Vitals:   01/09/23 1515  TempSrc:   PainSc: 0-No pain                 [place]

## 2023-01-27 ENCOUNTER — Telehealth: Payer: Self-pay | Admitting: *Deleted

## 2023-01-27 ENCOUNTER — Other Ambulatory Visit: Payer: Self-pay

## 2023-01-27 NOTE — Telephone Encounter (Signed)
Name: Tammy Cooper  DOB: [birth year]  MRN: [id number]  Primary Cardiologist: Armanda Magic, MD   Preoperative team, please contact this patient and set up a phone call appointment for further preoperative risk assessment. Please obtain consent and complete medication review. Thank you for your help. Last seen by Eligha Bridegroom 11/03/2022  I confirm that guidance regarding antiplatelet and oral anticoagulation therapy has been completed and, if necessary, noted below.  Per office protocol, if patient is without any new symptoms or concerns at the time of their virtual visit, he/she may hold ASA for 7 days prior to procedure. Please resume ASA as soon as possible postprocedure, at the discretion of the surgeon.    I also confirmed the patient resides in the state of [place]. As per [facility] Medical Board telemedicine laws, the patient must reside in the state in which the provider is licensed.   [name], NP 01/27/2023, 4:00 PM [facility]

## 2023-01-27 NOTE — Progress Notes (Signed)
Spoke with patient during pre op call about cardiac history and she mentioned that Dr. Mayford Knife wanted to place a loop recorder to r/o afib. Patient stated that she personally wanted to wait to do this until after elective surgery with Dr. Arita Miss. I called Dr. Sueanne Margarita office to have them please obtain a cards clearance to be sure the loop recorder can wait until after pending procedure.

## 2023-01-27 NOTE — Telephone Encounter (Signed)
Tried to reach the pt but no answer. Pt will need tele pre op appt. Ok per [name], DNP to add on due to procedure date and med hold.

## 2023-01-27 NOTE — Telephone Encounter (Signed)
Pre-operative Risk Assessment    Patient Name: Tammy Cooper  DOB: 23-Jul-1950 MRN: [id number]   DATE OF LAST VISIT: 11/03/22 Eligha Bridegroom, NP DATE OF NEXT VISIT: NONE   Request for Surgical Clearance    Procedure:   EXCISION RIGHT AXILLARY SKIN (MEDIUM) AND B/L FAT GRAFTING   Date of Surgery:  Clearance 02/06/23                                 Surgeon:  DR. Steffanie Dunn PACE Surgeon's Group or Practice Name:  [facility]  Phone number:  [phone] Fax number:  [phone]   Type of Clearance Requested:   - Medical ; ASA    Type of Anesthesia:  General    Additional requests/questions:    Elpidio Anis   01/27/2023, 2:03 PM

## 2023-01-30 ENCOUNTER — Inpatient Hospital Stay: Payer: Medicare PPO | Attending: Hematology and Oncology | Admitting: Hematology and Oncology

## 2023-01-30 ENCOUNTER — Telehealth: Payer: Self-pay | Admitting: *Deleted

## 2023-01-30 VITALS — BP 152/58 | HR 69 | Temp 98.1°F | Resp 17 | Wt 217.4 lb

## 2023-01-30 DIAGNOSIS — Z79811 Long term (current) use of aromatase inhibitors: Secondary | ICD-10-CM | POA: Diagnosis not present

## 2023-01-30 DIAGNOSIS — Z171 Estrogen receptor negative status [ER-]: Secondary | ICD-10-CM | POA: Diagnosis not present

## 2023-01-30 DIAGNOSIS — I4891 Unspecified atrial fibrillation: Secondary | ICD-10-CM | POA: Diagnosis not present

## 2023-01-30 DIAGNOSIS — D0512 Intraductal carcinoma in situ of left breast: Secondary | ICD-10-CM | POA: Insufficient documentation

## 2023-01-30 DIAGNOSIS — C50412 Malignant neoplasm of upper-outer quadrant of left female breast: Secondary | ICD-10-CM

## 2023-01-30 DIAGNOSIS — Z9013 Acquired absence of bilateral breasts and nipples: Secondary | ICD-10-CM | POA: Diagnosis not present

## 2023-01-30 NOTE — Telephone Encounter (Signed)
Pt has been scheduled for tele pre op appt 01/31/23. Med rec and consent are done.   There was a time slot that opened , so need to need to overbook the sched.     Patient Consent for Virtual Visit        Tammy Cooper has provided verbal consent on 01/30/2023 for a virtual visit (video or telephone).   CONSENT FOR VIRTUAL VISIT FOR:  Tammy Cooper  By participating in this virtual visit I agree to the following:  I hereby voluntarily request, consent and authorize [facility] and its employed or contracted physicians, physician assistants, nurse practitioners or other licensed health care professionals (the Practitioner), to provide me with telemedicine health care services (the "Services") as deemed necessary by the treating Practitioner. I acknowledge and consent to receive the Services by the Practitioner via telemedicine. I understand that the telemedicine visit will involve communicating with the Practitioner through live audiovisual communication technology and the disclosure of certain medical information by electronic transmission. I acknowledge that I have been given the opportunity to request an in-person assessment or other available alternative prior to the telemedicine visit and am voluntarily participating in the telemedicine visit.  I understand that I have the right to withhold or withdraw my consent to the use of telemedicine in the course of my care at any time, without affecting my right to future care or treatment, and that the Practitioner or I may terminate the telemedicine visit at any time. I understand that I have the right to inspect all information obtained and/or recorded in the course of the telemedicine visit and may receive copies of available information for a reasonable fee.  I understand that some of the potential risks of receiving the Services via telemedicine include:  Delay or interruption in medical evaluation due to  technological equipment failure or disruption; Information transmitted may not be sufficient (e.g. poor resolution of images) to allow for appropriate medical decision making by the Practitioner; and/or  In rare instances, security protocols could fail, causing a breach of personal health information.  Furthermore, I acknowledge that it is my responsibility to provide information about my medical history, conditions and care that is complete and accurate to the best of my ability. I acknowledge that Practitioner's advice, recommendations, and/or decision may be based on factors not within their control, such as incomplete or inaccurate data provided by me or distortions of diagnostic images or specimens that may result from electronic transmissions. I understand that the practice of medicine is not an exact science and that Practitioner makes no warranties or guarantees regarding treatment outcomes. I acknowledge that a copy of this consent can be made available to me via my patient portal [facility] MyChart), or I can request a printed copy by calling the office of [facility].    I understand that my insurance will be billed for this visit.   I have read or had this consent read to me. I understand the contents of this consent, which adequately explains the benefits and risks of the Services being provided via telemedicine.  I have been provided ample opportunity to ask questions regarding this consent and the Services and have had my questions answered to my satisfaction. I give my informed consent for the services to be provided through the use of telemedicine in my medical care

## 2023-01-30 NOTE — Progress Notes (Signed)
Patient Care Team: Deatra James, MD as PCP - General (Family Medicine) Quintella Reichert, MD as PCP - Cardiology (Cardiology) Lanier Prude, MD as PCP - Electrophysiology (Cardiology) Quintella Reichert, MD as Consulting Physician (Cardiology) Serena Croissant, MD as Consulting Physician (Hematology and Oncology) Abigail Miyamoto, MD as Consulting Physician (General Surgery)  DIAGNOSIS:  Encounter Diagnosis  Name Primary?   Malignant neoplasm of upper-outer quadrant of left breast in female, estrogen receptor negative (HCC) Yes    SUMMARY OF ONCOLOGIC HISTORY: Oncology History  Malignant neoplasm of upper-outer quadrant of left breast in female, estrogen receptor negative (HCC)  05/22/2014 Breast MRI   Left breast 12:00 position irregular nodular enhancement on 0.6 x 1.2 x 0.8 cm, no abnormal lymph nodes   07/31/2014 Surgery   Left breast lumpectomy: DCIS with calcifications 1 cm size ER 99%, PR 99%   09/29/2014 - 10/31/2014 Radiation Therapy   Adjuvant radiation   11/18/2014 -  Anti-estrogen oral therapy   anastrozole 1 mg daily 5 years   03/11/2021 Relapse/Recurrence   Patient felt a lump and there were 2.5 cm of suspicious calcifications which on biopsy came back as microscopic focus of IDC with DCIS   04/07/2021 Surgery   Bilateral mastectomies Right mastectomy: Fibrocystic changes Left mastectomy: 3 mm residual focus of grade 2 IDC and multiple foci of high-grade DCIS, margins negative, ER 0%, PR 0%, HER2 not performed, Ki-67 less than 1%   07/28/2021 Cancer Staging   Staging form: Breast, AJCC 7th Edition - Clinical: Stage IA (T1a, N0, M0) - Signed by Loa Socks, NP on 07/28/2021     CHIEF COMPLIANT: Surveillance of breast cancer  Discussed the use of AI scribe software for clinical note transcription with the patient, who gave verbal consent to proceed.  History of Present Illness   The patient, with a history of DCIS and bilateral mastectomies, is scheduled  for a cosmetic procedure to improve the appearance of her chest. The procedure will involve the removal of a large area that resembles a lipoma, work on tight scar tissue under one arm, and liposuction to correct a concave appearance. The patient has also been dealing with a sensation of a "kernel" in her armpit, which is likely scar tissue or a pocket of fluid due to interrupted drainage.  In addition to her history of breast cancer, the patient has a history of ocular stroke and has been monitored for atrial fibrillation using a loop recorder. The loop recorder did not show any atrial fibrillation from September to December, which was the concern related to her ocular stroke.         ALLERGIES:  is allergic to lipitor [atorvastatin] and other.  MEDICATIONS:  Current Outpatient Medications  Medication Sig Dispense Refill   aspirin EC 81 MG tablet Take 1 tablet (81 mg total) by mouth daily. (Patient taking differently: Take 81 mg by mouth at bedtime.) 90 tablet 3   cholecalciferol (VITAMIN D3) 25 MCG (1000 UNIT) tablet Take 1,000 Units by mouth at bedtime.     co-enzyme Q-10 30 MG capsule Take 30 mg by mouth 3 (three) times daily.     ezetimibe (ZETIA) 10 MG tablet TAKE ONE TABLET BY MOUTH DAILY (Patient taking differently: Take 10 mg by mouth daily.) 90 tablet 3   Multiple Vitamin (MULTIVITAMIN WITH MINERALS) TABS tablet Take 1 tablet by mouth at bedtime.     rosuvastatin (CRESTOR) 10 MG tablet Take 1 tablet (10 mg total) by mouth daily. (Patient taking  differently: Take 10 mg by mouth at bedtime.) 90 tablet 3   telmisartan (MICARDIS) 40 MG tablet Take 1 tablet (40 mg total) by mouth daily. (Patient taking differently: Take 40 mg by mouth at bedtime.) 90 tablet 3   zinc gluconate 50 MG tablet Take 50 mg by mouth at bedtime.     No current facility-administered medications for this visit.    PHYSICAL EXAMINATION: ECOG PERFORMANCE STATUS: 1 - Symptomatic but completely ambulatory  Vitals:    01/30/23 1007  BP: (!) 152/58  Pulse: 69  Resp: 17  Temp: 98.1 F (36.7 C)  SpO2: 98%   Filed Weights   01/30/23 1007  Weight: 217 lb 6.4 oz (98.6 kg)    Physical Exam no palpable lumps on his bilateral chest wall or axilla        (exam performed in the presence of a chaperone)  LABORATORY DATA:  I have reviewed the data as listed    Latest Ref Rng & Units 01/09/2023   12:58 PM 12/08/2020   11:36 AM 10/23/2020    7:44 AM  CMP  Glucose 70 - 99 mg/dL 98  93    BUN 8 - 23 mg/dL 8  15    Creatinine 8.11 - 1.00 mg/dL 9.14  7.82    Sodium 956 - 145 mmol/L 141  142    Potassium 3.5 - 5.1 mmol/L 3.9  4.5    Chloride 98 - 111 mmol/L 104  102    CO2 20 - 29 mmol/L  21    Calcium 8.7 - 10.3 mg/dL  9.6    ALT 0 - 32 IU/L   20     Lab Results  Component Value Date   WBC 11.9 (H) 12/02/2015   HGB 13.9 01/09/2023   HCT 41.0 01/09/2023   MCV 89.8 12/02/2015   PLT 255 12/02/2015   NEUTROABS 3.8 12/07/2006    ASSESSMENT & PLAN:  Malignant neoplasm of upper-outer quadrant of left breast in female, 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 treatment: Adjuvant anastrozole 1 mg daily ( chosen because of history of TIA) since 11/18/2014-2021     Relapse/recurrence:Patient felt a lump and there were 2.5 cm of suspicious calcifications which on biopsy came back as microscopic focus of IDC with DCIS   04/07/2021: Bilateral mastectomies Right mastectomy: Fibrocystic changes Left mastectomy: 3 mm residual focus of grade 2 IDC and multiple foci of high-grade DCIS, margins negative, ER 0%, PR 0%, HER2 not performed, Ki-67 less than 1%    Patient has surgery scheduled on 02/06/2023 for chest wall fat grafting and repair and reconstruction.  (With Dr. Arita Miss)  Breast cancer surveillance: No role of imaging since she had bilateral mastectomies. Chest wall examination 01/30/2023: Benign  Atrial  Fibrillation: No reported episodes of Afib from September to December. Cardiology to provide clearance for upcoming surgery. -Continue current management and follow-up with cardiology as scheduled.  Treatment plan: Surveillance Return to clinic in 1 year for follow-up    No orders of the defined types were placed in this encounter.  The patient has a good understanding of the overall plan. she agrees with it. she will call with any problems that may develop before the next visit here. Total time spent: 30 mins including face to face time and time spent for planning, charting and co-ordination of care   Tamsen Meek, MD 01/30/23

## 2023-01-30 NOTE — Telephone Encounter (Signed)
Pt has been scheduled for tele pre op appt 01/31/23. Med rec and consent are done.    There was a time slot that opened , so need to need to overbook the sched.

## 2023-01-30 NOTE — Assessment & Plan Note (Addendum)
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 treatment: Adjuvant anastrozole 1 mg daily ( chosen because of history of TIA) since 11/18/2014-2021     Relapse/recurrence:Patient felt a lump and there were 2.5 cm of suspicious calcifications which on biopsy came back as microscopic focus of IDC with DCIS   04/07/2021: Bilateral mastectomies Right mastectomy: Fibrocystic changes Left mastectomy: 3 mm residual focus of grade 2 IDC and multiple foci of high-grade DCIS, margins negative, ER 0%, PR 0%, HER2 not performed, Ki-67 less than 1%    Patient has surgery scheduled on 02/06/2023 for chest wall fat grafting and repair and reconstruction.  (With Dr. Arita Miss)  Breast cancer surveillance: No role of imaging since she had bilateral mastectomies. Chest wall examination 01/30/2023: Benign   Treatment plan: Surveillance Return to clinic in 1 year for follow-up

## 2023-01-30 NOTE — Progress Notes (Unsigned)
Virtual Visit via Telephone Note   Because of Tammy Cooper's co-morbid illnesses, she is at least at moderate risk for complications without adequate follow up.  This format is felt to be most appropriate for this patient at this time.  The patient did not have access to video technology/had technical difficulties with video requiring transitioning to audio format only (telephone).  All issues noted in this document were discussed and addressed.  No physical exam could be performed with this format.  Please refer to the patient's chart for her consent to telehealth for [facility].  Evaluation Performed:  Preoperative cardiovascular risk assessment _____________   Date:  01/31/2023   Patient ID:  Tammy Cooper, DOB 19-May-1950, MRN [id number] Patient Location:  Home Provider location:   Office  Primary Care Provider:  Deatra James, MD Primary Cardiologist:  Armanda Magic, MD  Chief Complaint / Patient Profile   72 y.o. 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    She is pending EXCISION RIGHT AXILLARY SKIN (MEDIUM) AND B/L FAT GRAFTING by Dr. Merry Proud on 02/06/2023 and presents today for telephonic preoperative cardiovascular risk assessment.  History of Present Illness    Tammy Cooper is a 72 y.o. female who presents via audio/video conferencing for a telehealth visit today.  Pt was last seen in cardiology clinic on 11/03/2022 by Eligha Bridegroom, NP.  At that time Tammy Cooper was complaining of lightheadedness and palpitations.  Consideration for ILR or cardiac monitor.  Was to follow-up with Dr. Mayford Knife in 6 months..  The patient is now pending procedure as outlined above. Since her last visit, she has had no further complaints of palpitations or lightheadedness.  She goes to the aquatic center and does  aerobics.  She is able to climb stairs in her home, she is able to complete her ADLs.  No issues with any activities which would cause any angina.  Past Medical History    Past Medical History:  Diagnosis Date   Anal fistula    Anxiety    Carotid stenosis, asymptomatic, right    1-39% right ICA stenosis and left subclavian stenosis   Complication of anesthesia    Depression    History of left breast cancer 2016   oncologist--- dr Pamelia Hoit;  dx 04/ 2016  DCIS HG, ER/PR +;  s/p left breast lumpectomy 07-31-2014,  completed radiation 10-31-2014, completed 5 yrs antiestrogen   History of recurrent cystitis    urologist--- dr pace   History of stroke 09/2020   ocular cryptogenic stroke / cardioembolic followed by [facility] released 06/ 2023  to local eye doctor   History of transient ischemic attack (TIA) 2006   no residual   Hyperlipidemia    Hypertension    Malignant neoplasm of upper-outer quadrant of left breast in female, estrogen receptor negative (HCC) 02/2021   recurrence from previous left breast 2016;  oncologist--- dr Pamelia Hoit  ;   04-07-2021  s/p  bilateral masectomy,  IDC/  DCIS  ER/PR negative, Stage 1A   OA (osteoarthritis)    PONV (postoperative nausea and vomiting)    Spinal stenosis    Past Surgical History:  Procedure Laterality Date   ANAL EXAMINATION UNDER ANESTHESIA  10/21/2010   @[facility]  by dr d. Magnus Ivan;  excision anal skin tag   BREAST LUMPECTOMY WITH RADIOACTIVE SEED LOCALIZATION Left 07/31/2014   Procedure: LEFT BREAST LUMPECTOMY  WITH RADIOACTIVE SEED LOCALIZATION;  Surgeon: Abigail Miyamoto, MD;  Location: [facility];  Service: General;  Laterality: Left;   COLONOSCOPY  07/2022   [facility]  dr Lenise Arena   HEMORROIDECTOMY     KNEE ARTHROSCOPY Left 05/07/2008   @[facility]  by dr Lequita Halt   LAPAROSCOPIC VAGINAL HYSTERECTOMY WITH SALPINGO OOPHORECTOMY  07/02/2001   @[facility]  by dr Senaida Ores ;   anterior & posterior repair/ dr Vernie Ammons did tension free  vaginal tape procedure   POSTERIOR FUSION LUMBAR SPINE  07/12/2010   @[facility]  by dr Danielle Dess;  L4--5   SHOULDER ARTHROSCOPY DISTAL CLAVICLE EXCISION AND OPEN ROTATOR CUFF REPAIR Right 07/19/2000   @[facility]  by dr Sherlean Foot   TONSILLECTOMY AND ADENOIDECTOMY  1958   TOTAL KNEE ARTHROPLASTY Right 08/04/2015   Procedure: RIGHT TOTAL KNEE ARTHROPLASTY;  Surgeon: Durene Romans, MD;  Location: [facility];  Service: Orthopedics;  Laterality: Right;   TOTAL KNEE ARTHROPLASTY Left 12/01/2015   Procedure: LEFT TOTAL KNEE ARTHROPLASTY;  Surgeon: Durene Romans, MD;  Location: [facility];  Service: Orthopedics;  Laterality: Left;   TOTAL MASTECTOMY Bilateral 04/07/2021   Procedure: BILATERAL TOTAL MASTECTOMY;  Surgeon: Abigail Miyamoto, MD;  Location: [facility];  Service: General;  Laterality: Bilateral;    Allergies  Allergies  Allergen Reactions   Lipitor [Atorvastatin]     Muscle aches   Other Hives    PATIENT ALLERGIC TO SURGICAL GLUE    Home Medications    Prior to Admission medications   Medication Sig Start Date End Date Taking? Authorizing Provider  aspirin EC 81 MG tablet Take 1 tablet (81 mg total) by mouth daily. Patient taking differently: Take 81 mg by mouth at bedtime. 09/26/19   Quintella Reichert, MD  cholecalciferol (VITAMIN D3) 25 MCG (1000 UNIT) tablet Take 1,000 Units by mouth at bedtime.    [provider]  co-enzyme Q-10 30 MG capsule Take 30 mg by mouth 3 (three) times daily.    [provider]  ezetimibe (ZETIA) 10 MG tablet TAKE ONE TABLET BY MOUTH DAILY Patient taking differently: Take 10 mg by mouth daily. 09/07/22   Quintella Reichert, MD  Multiple Vitamin (MULTIVITAMIN WITH MINERALS) TABS tablet Take 1 tablet by mouth at bedtime.    [provider]  rosuvastatin (CRESTOR) 10 MG tablet Take 1 tablet (10 mg total) by mouth daily. Patient taking differently: Take 10 mg by mouth at bedtime. 11/03/22   Swinyer, Zachary George, NP  telmisartan (MICARDIS) 40 MG  tablet Take 1 tablet (40 mg total) by mouth daily. Patient taking differently: Take 40 mg by mouth at bedtime. 06/01/22   Quintella Reichert, MD  zinc gluconate 50 MG tablet Take 50 mg by mouth at bedtime.    [provider]    Physical Exam    Vital Signs:  Tammy Cooper does not have vital signs available for review today.  Given telephonic nature of communication, physical exam is limited. AAOx3. NAD. Normal affect.  Speech and respirations are unlabored.  Accessory Clinical Findings    None  Assessment & Plan    1.  Preoperative Cardiovascular Risk Assessment:  According to the Revised Cardiac Risk Index (RCRI), her Perioperative Risk of Major Cardiac Event is (%): 0.9  Her Functional Capacity in METs is: 9.89 according to the [facility] Activity Status Index (DASI).   Per office protocol, if patient is without any new symptoms or concerns at the time of their virtual visit, he/she may hold ASA for 7  days prior to procedure. Please resume ASA as soon as possible postprocedure, at the discretion of the surgeon.    The patient was advised that if she develops new symptoms prior to surgery to contact our office to arrange for a follow-up visit, and she verbalized understanding.  Therefore, based on ACC/AHA guidelines, patient would be at acceptable risk for the planned procedure without further cardiovascular testing. I will route this recommendation to the requesting party via Epic fax function.   Today, I have spent 10 minutes with the patient with telehealth technology discussing medical history, symptoms, and management plan.     [name], NP  01/31/2023, 2:43 PM

## 2023-01-31 ENCOUNTER — Ambulatory Visit: Payer: Medicare PPO | Attending: Cardiovascular Disease

## 2023-01-31 DIAGNOSIS — Z01810 Encounter for preprocedural cardiovascular examination: Secondary | ICD-10-CM | POA: Diagnosis not present

## 2023-01-31 DIAGNOSIS — Z01818 Encounter for other preprocedural examination: Secondary | ICD-10-CM

## 2023-02-02 ENCOUNTER — Telehealth: Payer: Self-pay | Admitting: Cardiology

## 2023-02-02 NOTE — Telephone Encounter (Signed)
Office calling to f/u on Clearance. Please advise

## 2023-02-02 NOTE — Anesthesia Preprocedure Evaluation (Addendum)
Anesthesia Evaluation  Patient identified by MRN, date of birth, ID band Patient awake    Reviewed: Allergy & Precautions, NPO status , Patient's Chart, lab work & pertinent test results  History of Anesthesia Complications (+) PONV and history of anesthetic complications  Airway Mallampati: III  TM Distance: >3 FB Neck ROM: Full    Dental no notable dental hx. (+) Teeth Intact, Dental Advisory Given   Pulmonary  Snores at night, no witnessed apneas, has never had sleep study   Pulmonary exam normal breath sounds clear to auscultation       Cardiovascular hypertension (148/58 preop), Pt. on medications Normal cardiovascular exam Rhythm:Regular Rate:Normal     Neuro/Psych  PSYCHIATRIC DISORDERS Anxiety Depression    negative neurological ROS     GI/Hepatic negative GI ROS, Neg liver ROS,,,  Endo/Other  Obesity BMI 39  Renal/GU negative Renal ROS  negative genitourinary   Musculoskeletal  (+) Arthritis , Osteoarthritis,    Abdominal  (+) + obese  Peds  Hematology negative hematology ROS (+)   Anesthesia Other Findings   Reproductive/Obstetrics negative OB ROS                              Anesthesia Physical Anesthesia Plan  ASA: 2  Anesthesia Plan: General   Post-op Pain Management: Tylenol PO (pre-op)*   Induction: Intravenous  PONV Risk Score and Plan: 4 or greater and Ondansetron, Dexamethasone and Treatment may vary due to age or medical condition  Airway Management Planned: Oral ETT  Additional Equipment: None  Intra-op Plan:   Post-operative Plan: Extubation in OR  Informed Consent:   Plan Discussed with:   Anesthesia Plan Comments:        Anesthesia Quick Evaluation

## 2023-02-02 NOTE — Telephone Encounter (Signed)
I s/w Tammy Cooper at surgeon's office and updated the pt was cleared on 01/31/23 by [name], DNP. I review about ASA ok to hold if needed x 7 days. I assured Tammy Cooper that I will re-fax notes to their office, with confirmed fax#.

## 2023-02-06 ENCOUNTER — Other Ambulatory Visit: Payer: Self-pay

## 2023-02-06 ENCOUNTER — Ambulatory Visit (HOSPITAL_BASED_OUTPATIENT_CLINIC_OR_DEPARTMENT_OTHER): Payer: Medicare PPO | Admitting: Anesthesiology

## 2023-02-06 ENCOUNTER — Encounter (HOSPITAL_BASED_OUTPATIENT_CLINIC_OR_DEPARTMENT_OTHER)
Admission: RE | Disposition: A | Discharge status: Home / Self Care | Payer: Self-pay | Source: Home / Self Care | Attending: Plastic Surgery

## 2023-02-06 ENCOUNTER — Ambulatory Visit (HOSPITAL_BASED_OUTPATIENT_CLINIC_OR_DEPARTMENT_OTHER)
Admission: RE | Admit: 2023-02-06 | Discharge: 2023-02-06 | Disposition: A | Discharge status: Home / Self Care | Payer: Medicare PPO | Attending: Plastic Surgery | Admitting: Plastic Surgery

## 2023-02-06 ENCOUNTER — Encounter (HOSPITAL_BASED_OUTPATIENT_CLINIC_OR_DEPARTMENT_OTHER): Payer: Self-pay | Admitting: Urology

## 2023-02-06 DIAGNOSIS — L989 Disorder of the skin and subcutaneous tissue, unspecified: Secondary | ICD-10-CM

## 2023-02-06 DIAGNOSIS — Z6838 Body mass index (BMI) 38.0-38.9, adult: Secondary | ICD-10-CM | POA: Insufficient documentation

## 2023-02-06 DIAGNOSIS — F419 Anxiety disorder, unspecified: Secondary | ICD-10-CM | POA: Diagnosis not present

## 2023-02-06 DIAGNOSIS — Z923 Personal history of irradiation: Secondary | ICD-10-CM | POA: Insufficient documentation

## 2023-02-06 DIAGNOSIS — C44622 Squamous cell carcinoma of skin of right upper limb, including shoulder: Secondary | ICD-10-CM | POA: Diagnosis not present

## 2023-02-06 DIAGNOSIS — N650 Deformity of reconstructed breast: Secondary | ICD-10-CM | POA: Diagnosis not present

## 2023-02-06 DIAGNOSIS — M1990 Unspecified osteoarthritis, unspecified site: Secondary | ICD-10-CM | POA: Insufficient documentation

## 2023-02-06 DIAGNOSIS — M7989 Other specified soft tissue disorders: Secondary | ICD-10-CM | POA: Diagnosis not present

## 2023-02-06 DIAGNOSIS — Z01818 Encounter for other preprocedural examination: Secondary | ICD-10-CM

## 2023-02-06 DIAGNOSIS — R0683 Snoring: Secondary | ICD-10-CM | POA: Insufficient documentation

## 2023-02-06 DIAGNOSIS — E669 Obesity, unspecified: Secondary | ICD-10-CM | POA: Insufficient documentation

## 2023-02-06 DIAGNOSIS — I10 Essential (primary) hypertension: Secondary | ICD-10-CM | POA: Diagnosis not present

## 2023-02-06 DIAGNOSIS — Z421 Encounter for breast reconstruction following mastectomy: Secondary | ICD-10-CM | POA: Diagnosis not present

## 2023-02-06 DIAGNOSIS — N651 Disproportion of reconstructed breast: Secondary | ICD-10-CM | POA: Diagnosis not present

## 2023-02-06 DIAGNOSIS — Z853 Personal history of malignant neoplasm of breast: Secondary | ICD-10-CM | POA: Insufficient documentation

## 2023-02-06 DIAGNOSIS — L905 Scar conditions and fibrosis of skin: Secondary | ICD-10-CM | POA: Diagnosis not present

## 2023-02-06 DIAGNOSIS — F32A Depression, unspecified: Secondary | ICD-10-CM | POA: Insufficient documentation

## 2023-02-06 HISTORY — PX: LIPOSUCTION WITH LIPOFILLING: SHX6436

## 2023-02-06 HISTORY — PX: MASS EXCISION: SHX2000

## 2023-02-06 HISTORY — PX: EXCISION MASS UPPER EXTREMETIES: SHX6704

## 2023-02-06 SURGERY — EXCISION MASS
Anesthesia: General | Site: Shoulder | Laterality: Right

## 2023-02-06 MED ORDER — LIDOCAINE-EPINEPHRINE 1 %-1:100000 IJ SOLN
INTRAMUSCULAR | Status: AC
  Filled 2023-02-06: qty 1

## 2023-02-06 MED ORDER — EPINEPHRINE PF 1 MG/ML IJ SOLN
INTRAMUSCULAR | Status: AC
  Filled 2023-02-06: qty 1

## 2023-02-06 MED ORDER — FENTANYL CITRATE (PF) 100 MCG/2ML IJ SOLN
INTRAMUSCULAR | Status: AC
  Filled 2023-02-06: qty 2

## 2023-02-06 MED ORDER — DEXAMETHASONE SODIUM PHOSPHATE 10 MG/ML IJ SOLN
INTRAMUSCULAR | Status: AC
  Filled 2023-02-06: qty 1

## 2023-02-06 MED ORDER — PROPOFOL 10 MG/ML IV BOLUS
INTRAVENOUS | Status: AC
  Filled 2023-02-06: qty 20

## 2023-02-06 MED ORDER — ROCURONIUM BROMIDE 100 MG/10ML IV SOLN
INTRAVENOUS | Status: DC | PRN
  Administered 2023-02-06: 20 mg via INTRAVENOUS
  Administered 2023-02-06: 60 mg via INTRAVENOUS

## 2023-02-06 MED ORDER — GLYCOPYRROLATE 0.2 MG/ML IJ SOLN
INTRAMUSCULAR | Status: DC | PRN
  Administered 2023-02-06: .2 mg via INTRAVENOUS

## 2023-02-06 MED ORDER — BACITRACIN ZINC 500 UNIT/GM EX OINT
TOPICAL_OINTMENT | CUTANEOUS | Status: AC
  Filled 2023-02-06: qty 1.8

## 2023-02-06 MED ORDER — LIDOCAINE HCL 1 % IJ SOLN
INTRAVENOUS | Status: DC | PRN
  Administered 2023-02-06: 1600 mL

## 2023-02-06 MED ORDER — GLYCOPYRROLATE PF 0.2 MG/ML IJ SOSY
PREFILLED_SYRINGE | INTRAMUSCULAR | Status: AC
  Filled 2023-02-06: qty 1

## 2023-02-06 MED ORDER — EPHEDRINE 5 MG/ML INJ
INTRAVENOUS | Status: AC
  Filled 2023-02-06: qty 5

## 2023-02-06 MED ORDER — LIDOCAINE HCL (CARDIAC) PF 100 MG/5ML IV SOSY
PREFILLED_SYRINGE | INTRAVENOUS | Status: DC | PRN
  Administered 2023-02-06: 60 mg via INTRAVENOUS

## 2023-02-06 MED ORDER — BUPIVACAINE HCL (PF) 0.25 % IJ SOLN
INTRAMUSCULAR | Status: AC
  Filled 2023-02-06: qty 30

## 2023-02-06 MED ORDER — AMISULPRIDE (ANTIEMETIC) 5 MG/2ML IV SOLN: 10.0000 mg | Freq: Once | INTRAVENOUS | Status: DC | PRN

## 2023-02-06 MED ORDER — PROPOFOL 10 MG/ML IV BOLUS
INTRAVENOUS | Status: DC | PRN
  Administered 2023-02-06: 150 mg via INTRAVENOUS

## 2023-02-06 MED ORDER — ONDANSETRON HCL 4 MG/2ML IJ SOLN: 4.0000 mg | Freq: Once | INTRAMUSCULAR | Status: DC | PRN

## 2023-02-06 MED ORDER — OXYCODONE HCL 5 MG/5ML PO SOLN: 5.0000 mg | Freq: Once | ORAL | Status: DC | PRN

## 2023-02-06 MED ORDER — LIDOCAINE HCL (PF) 1 % IJ SOLN
INTRAMUSCULAR | Status: AC
  Filled 2023-02-06: qty 30

## 2023-02-06 MED ORDER — ONDANSETRON HCL 4 MG/2ML IJ SOLN
INTRAMUSCULAR | Status: AC
  Filled 2023-02-06: qty 2

## 2023-02-06 MED ORDER — PHENYLEPHRINE 80 MCG/ML (10ML) SYRINGE FOR IV PUSH (FOR BLOOD PRESSURE SUPPORT)
PREFILLED_SYRINGE | INTRAVENOUS | Status: AC
  Filled 2023-02-06: qty 10

## 2023-02-06 MED ORDER — KETOROLAC TROMETHAMINE 30 MG/ML IJ SOLN
INTRAMUSCULAR | Status: DC | PRN
  Administered 2023-02-06: 30 mg via INTRAVENOUS

## 2023-02-06 MED ORDER — ACETAMINOPHEN 500 MG PO TABS
1000.0000 mg | ORAL_TABLET | Freq: Once | ORAL | Status: AC
  Administered 2023-02-06: 1000 mg via ORAL

## 2023-02-06 MED ORDER — FENTANYL CITRATE (PF) 100 MCG/2ML IJ SOLN
INTRAMUSCULAR | Status: DC | PRN
  Administered 2023-02-06 (×2): 25 ug via INTRAVENOUS
  Administered 2023-02-06 (×3): 50 ug via INTRAVENOUS

## 2023-02-06 MED ORDER — EPHEDRINE SULFATE (PRESSORS) 50 MG/ML IJ SOLN
INTRAMUSCULAR | Status: DC | PRN
  Administered 2023-02-06: 10 mg via INTRAVENOUS
  Administered 2023-02-06: 5 mg via INTRAVENOUS
  Administered 2023-02-06: 10 mg via INTRAVENOUS

## 2023-02-06 MED ORDER — SUGAMMADEX SODIUM 200 MG/2ML IV SOLN
INTRAVENOUS | Status: DC | PRN
Start: 2023-02-06 — End: 2023-02-06
  Administered 2023-02-06: 200 mg via INTRAVENOUS

## 2023-02-06 MED ORDER — OXYCODONE HCL 5 MG PO TABS: 5.0000 mg | ORAL_TABLET | Freq: Once | ORAL | Status: DC | PRN

## 2023-02-06 MED ORDER — LACTATED RINGERS IV SOLN: INTRAVENOUS | Status: DC

## 2023-02-06 MED ORDER — ACETAMINOPHEN 500 MG PO TABS
ORAL_TABLET | ORAL | Status: AC
  Filled 2023-02-06: qty 2

## 2023-02-06 MED ORDER — LIDOCAINE 2% (20 MG/ML) 5 ML SYRINGE
INTRAMUSCULAR | Status: AC
  Filled 2023-02-06: qty 5

## 2023-02-06 MED ORDER — ONDANSETRON HCL 4 MG/2ML IJ SOLN
INTRAMUSCULAR | Status: DC | PRN
  Administered 2023-02-06: 4 mg via INTRAVENOUS

## 2023-02-06 MED ORDER — DEXAMETHASONE SODIUM PHOSPHATE 4 MG/ML IJ SOLN
INTRAMUSCULAR | Status: DC | PRN
  Administered 2023-02-06: 5 mg via INTRAVENOUS

## 2023-02-06 MED ORDER — CEFAZOLIN SODIUM-DEXTROSE 2-4 GM/100ML-% IV SOLN
2.0000 g | INTRAVENOUS | Status: AC
  Administered 2023-02-06: 2 g via INTRAVENOUS

## 2023-02-06 MED ORDER — ROCURONIUM BROMIDE 10 MG/ML (PF) SYRINGE
PREFILLED_SYRINGE | INTRAVENOUS | Status: AC
  Filled 2023-02-06: qty 10

## 2023-02-06 MED ORDER — HYDROMORPHONE HCL 1 MG/ML IJ SOLN: 0.2500 mg | INTRAMUSCULAR | Status: DC | PRN

## 2023-02-06 MED ORDER — CEFAZOLIN SODIUM-DEXTROSE 2-4 GM/100ML-% IV SOLN
INTRAVENOUS | Status: AC
  Filled 2023-02-06: qty 100

## 2023-02-06 MED ORDER — BUPIVACAINE-EPINEPHRINE (PF) 0.25% -1:200000 IJ SOLN
INTRAMUSCULAR | Status: AC
  Filled 2023-02-06: qty 30

## 2023-02-06 MED ORDER — PHENYLEPHRINE HCL (PRESSORS) 10 MG/ML IV SOLN
INTRAVENOUS | Status: DC | PRN
Start: 2023-02-06 — End: 2023-02-06
  Administered 2023-02-06 (×2): 80 ug via INTRAVENOUS

## 2023-02-06 MED ORDER — BUPIVACAINE-EPINEPHRINE (PF) 0.5% -1:200000 IJ SOLN
INTRAMUSCULAR | Status: AC
  Filled 2023-02-06: qty 30

## 2023-02-06 SURGICAL SUPPLY — 93 items
APL PRP STRL LF DISP 70% ISPRP (MISCELLANEOUS) ×3
APL SKNCLS STERI-STRIP NONHPOA (GAUZE/BANDAGES/DRESSINGS) ×3
BAND INSRT 18 STRL LF DISP RB (MISCELLANEOUS)
BAND RUBBER #18 3X1/16 STRL (MISCELLANEOUS) IMPLANT
BENZOIN TINCTURE PRP APPL 2/3 (GAUZE/BANDAGES/DRESSINGS) IMPLANT
BINDER ABDOMINAL 10 UNV 27-48 (MISCELLANEOUS) IMPLANT
BINDER ABDOMINAL 12 SM 30-45 (SOFTGOODS) IMPLANT
BINDER BREAST LRG (GAUZE/BANDAGES/DRESSINGS) IMPLANT
BINDER BREAST XLRG (GAUZE/BANDAGES/DRESSINGS) IMPLANT
BINDER BREAST XXLRG (GAUZE/BANDAGES/DRESSINGS) IMPLANT
BLADE CLIPPER SURG (BLADE) IMPLANT
BLADE SURG 10 STRL SS (BLADE) IMPLANT
BLADE SURG 11 STRL SS (BLADE) ×3 IMPLANT
BLADE SURG 15 STRL LF DISP TIS (BLADE) ×3 IMPLANT
BLADE SURG 15 STRL SS (BLADE) ×3
BNDG CMPR MED 10X6 ELC LF (GAUZE/BANDAGES/DRESSINGS)
BNDG ELASTIC 6X10 VLCR STRL LF (GAUZE/BANDAGES/DRESSINGS) IMPLANT
CANISTER SUCT 1200ML W/VALVE (MISCELLANEOUS) IMPLANT
CHLORAPREP W/TINT 26 (MISCELLANEOUS) ×3 IMPLANT
COVER BACK TABLE 60X90IN (DRAPES) ×3 IMPLANT
COVER MAYO STAND STRL (DRAPES) ×6 IMPLANT
DRAIN JP 10F RND SILICONE (MISCELLANEOUS) IMPLANT
DRAPE LAPAROTOMY 100X72 PEDS (DRAPES) IMPLANT
DRAPE TOP ARMCOVERS (MISCELLANEOUS) ×3 IMPLANT
DRAPE U-SHAPE 76X120 STRL (DRAPES) ×3 IMPLANT
DRAPE UTILITY XL STRL (DRAPES) ×3 IMPLANT
DRSG TELFA 3X8 NADH STRL (GAUZE/BANDAGES/DRESSINGS) IMPLANT
ELECT COATED BLADE 2.86 ST (ELECTRODE) IMPLANT
ELECT NDL BLADE 2-5/6 (NEEDLE) ×3 IMPLANT
ELECT NEEDLE BLADE 2-5/6 (NEEDLE) ×3
ELECT REM PT RETURN 9FT ADLT (ELECTROSURGICAL) ×3
ELECT REM PT RETURN 9FT PED (ELECTROSURGICAL)
ELECTRODE REM PT RETRN 9FT PED (ELECTROSURGICAL) IMPLANT
ELECTRODE REM PT RTRN 9FT ADLT (ELECTROSURGICAL) ×3 IMPLANT
EVACUATOR SILICONE 100CC (DRAIN) IMPLANT
EXTRACTOR CANIST REVOLVE STRL (CANNISTER) ×3 IMPLANT
GAUZE PAD ABD 8X10 STRL (GAUZE/BANDAGES/DRESSINGS) ×6 IMPLANT
GAUZE SPONGE 2X2 STRL 8-PLY (GAUZE/BANDAGES/DRESSINGS) IMPLANT
GAUZE SPONGE 4X4 12PLY STRL LF (GAUZE/BANDAGES/DRESSINGS) IMPLANT
GAUZE XEROFORM 1X8 LF (GAUZE/BANDAGES/DRESSINGS) IMPLANT
GLOVE BIOGEL M STRL SZ7.5 (GLOVE) ×3 IMPLANT
GLOVE BIOGEL PI IND STRL 8 (GLOVE) ×3 IMPLANT
GOWN STRL REUS W/ TWL LRG LVL3 (GOWN DISPOSABLE) ×6 IMPLANT
GOWN STRL REUS W/TWL LRG LVL3 (GOWN DISPOSABLE) ×6
LINER CANISTER 1000CC FLEX (MISCELLANEOUS) ×3 IMPLANT
NDL FILTER BLUNT 18X1 1/2 (NEEDLE) IMPLANT
NDL HYPO 27GX1-1/4 (NEEDLE) ×3 IMPLANT
NDL HYPO 30GX1 BEV (NEEDLE) IMPLANT
NDL SAFETY ECLIPSE 18X1.5 (NEEDLE) ×3 IMPLANT
NEEDLE FILTER BLUNT 18X1 1/2 (NEEDLE)
NEEDLE HYPO 27GX1-1/4 (NEEDLE) ×3
NEEDLE HYPO 30GX1 BEV (NEEDLE)
NS IRRIG 1000ML POUR BTL (IV SOLUTION) IMPLANT
PACK BASIN DAY SURGERY FS (CUSTOM PROCEDURE TRAY) ×3 IMPLANT
PAD ALCOHOL SWAB (MISCELLANEOUS) ×3 IMPLANT
PENCIL SMOKE EVACUATOR (MISCELLANEOUS) ×3 IMPLANT
SHEET MEDIUM DRAPE 40X70 STRL (DRAPES) IMPLANT
SLEEVE SCD COMPRESS KNEE MED (STOCKING) ×3 IMPLANT
SPIKE FLUID TRANSFER (MISCELLANEOUS) IMPLANT
SPONGE T-LAP 18X18 ~~LOC~~+RFID (SPONGE) ×3 IMPLANT
STAPLER INSORB 30 2030 C-SECTI (MISCELLANEOUS) IMPLANT
STAPLER VISISTAT 35W (STAPLE) ×3 IMPLANT
STRIP CLOSURE SKIN 1/2X4 (GAUZE/BANDAGES/DRESSINGS) IMPLANT
STRIP SUTURE WOUND CLOSURE 1/2 (MISCELLANEOUS) IMPLANT
SUCTION TUBE FRAZIER 10FR DISP (SUCTIONS) IMPLANT
SUT CHROMIC 5 0 P 3 (SUTURE) IMPLANT
SUT CHROMIC 5 0 RB 1 27 (SUTURE) IMPLANT
SUT ETHILON 4 0 PS 2 18 (SUTURE) IMPLANT
SUT MNCRL AB 4-0 PS2 18 (SUTURE) IMPLANT
SUT MON AB 5-0 P3 18 (SUTURE) IMPLANT
SUT PDS 3-0 CT2 (SUTURE)
SUT PDS II 3-0 CT2 27 ABS (SUTURE) IMPLANT
SUT PLAIN 5 0 P 3 18 (SUTURE) IMPLANT
SUT PROLENE 5 0 P 3 (SUTURE) IMPLANT
SUT VIC AB 4-0 PS2 18 (SUTURE) IMPLANT
SUT VLOC 90 P-14 23 (SUTURE) IMPLANT
SWAB COLLECTION DEVICE MRSA (MISCELLANEOUS) IMPLANT
SWAB CULTURE ESWAB REG 1ML (MISCELLANEOUS) IMPLANT
SYR 10ML LL (SYRINGE) ×9 IMPLANT
SYR 50ML LL SCALE MARK (SYRINGE) IMPLANT
SYR BULB EAR ULCER 3OZ GRN STR (SYRINGE) IMPLANT
SYR BULB IRRIG 60ML STRL (SYRINGE) IMPLANT
SYR CONTROL 10ML LL (SYRINGE) ×3 IMPLANT
SYR TB 1ML LL NO SAFETY (SYRINGE) ×3 IMPLANT
SYR TOOMEY IRRIG 70ML (MISCELLANEOUS)
SYRINGE TOOMEY IRRIG 70ML (MISCELLANEOUS) IMPLANT
TOWEL GREEN STERILE FF (TOWEL DISPOSABLE) ×6 IMPLANT
TRAY DSU PREP LF (CUSTOM PROCEDURE TRAY) IMPLANT
TUBE CONNECTING 20X1/4 (TUBING) IMPLANT
TUBING INFILTRATION IT-10001 (TUBING) IMPLANT
TUBING SET GRADUATE ASPIR 12FT (MISCELLANEOUS) ×3 IMPLANT
UNDERPAD 30X36 HEAVY ABSORB (UNDERPADS AND DIAPERS) ×6 IMPLANT
YANKAUER SUCT BULB TIP NO VENT (SUCTIONS) IMPLANT

## 2023-02-06 NOTE — Interval H&P Note (Signed)
History and Physical Interval Note:  02/06/2023 7:46 AM  Tammy Cooper  has presented today for surgery, with the diagnosis of EXCESS AXILARY SKIN RIGHT SIDE.  The various methods of treatment have been discussed with the patient and family. After consideration of risks, benefits and other options for treatment, the patient has consented to  Procedure(s): EXCISION OF RIGHT AXILLARY SKIN (Right) BILATERAL BREAST FAT GRAFTING (Bilateral) as a surgical intervention.  The patient's history has been reviewed, patient examined, no change in status, stable for surgery.  I have reviewed the patient's chart and labs.  Questions were answered to the patient's satisfaction.     [name]

## 2023-02-06 NOTE — Transfer of Care (Signed)
Immediate Anesthesia Transfer of Care Note  Patient: Tammy Cooper  Procedure(s) Performed: Procedure(s) (LRB): EXCISION OF RIGHT AXILLARY SKIN (Right) BILATERAL BREAST FAT GRAFTING (Bilateral)  Patient Location: PACU  Anesthesia Type: GA  Level of Consciousness: awake, sedated, patient cooperative and responds to stimulation  Airway & Oxygen Therapy: Patient Spontanous Breathing and Patient connected to [place] oxygen  Post-op Assessment: Report given to PACU RN, Post -op Vital signs reviewed and stable and Patient moving all extremities  Post vital signs: Reviewed and stable  Complications: No apparent anesthesia complications

## 2023-02-06 NOTE — Anesthesia Postprocedure Evaluation (Signed)
Anesthesia Post Note  Patient: Tammy Cooper  Procedure(s) Performed: EXCISION OF RIGHT AXILLARY SKIN (Right: Chest) BILATERAL BREAST FAT GRAFTING (Bilateral: Chest) EXCISION MASS RIGHT SHOULDER (Shoulder)     Patient location during evaluation: PACU Anesthesia Type: General Level of consciousness: awake and alert, oriented and patient cooperative Pain management: pain level controlled Vital Signs Assessment: post-procedure vital signs reviewed and stable Respiratory status: spontaneous breathing, nonlabored ventilation and respiratory function stable Cardiovascular status: blood pressure returned to baseline and stable Postop Assessment: no apparent nausea or vomiting Anesthetic complications: no   No notable events documented.  Last Vitals:  Vitals:   02/06/23 1015 02/06/23 1030  BP: (!) 117/54 (!) 122/56  Pulse: 71 69  Resp: 17 20  Temp:  (!) 36.3 C  SpO2: 92% 95%    Last Pain:  Vitals:   02/06/23 1030  TempSrc:   PainSc: 2                  Lannie Fields

## 2023-02-06 NOTE — Op Note (Signed)
Operative Note   DATE OF OPERATION: 02/06/2023  SURGICAL DEPARTMENT: Plastic Surgery  PREOPERATIVE DIAGNOSES: History of breast cancer with bilateral mastectomy defects  POSTOPERATIVE DIAGNOSES:  same  PROCEDURE: 1.  Fat grafting to the right breast totaling 130 cc 2.  Fat grafting to the left breast totaling 130 cc 3.  Excision of excess right axillary tissue totaling 20 cm in length with complex closure 4.  Liposuction to the left axilla 5.  Excision of right shoulder skin lesion totaling 3.5 cm in length with complex closure  SURGEON: Ancil Linsey, MD  ASSISTANT: None  ANESTHESIA:  General.   COMPLICATIONS: None.   INDICATIONS FOR PROCEDURE:  The patient, Tammy Cooper is a 72 y.o. female born on 1951-03-30, is here for treatment of breast reconstruction after breast cancer treatment MRN: [id number]  CONSENT:  Informed consent was obtained directly from the patient. Risks, benefits and alternatives were fully discussed. Specific risks including but not limited to bleeding, infection, hematoma, seroma, scarring, pain, contracture, asymmetry, wound healing problems, and need for further surgery were all discussed. The patient did have an ample opportunity to have questions answered to satisfaction.   DESCRIPTION OF PROCEDURE:  The patient was taken to the operating room. SCDs were placed and antibiotics were given.  General anesthesia was administered.  The patient's operative site was prepped and draped in a sterile fashion. A time out was performed and all information was confirmed to be correct.  Preoperatively I had marked out the right axillary tissue to be removed and the left and right breast areas to be grafted.  I had marked out a 4 mm margin around the right shoulder skin lesion.  Tumescent solution was infiltrated in all these places and also the abdomen and given time to work.  I started by removing the right shoulder skin lesion.  This was excised  full-thickness with a 15 blade.  Hemostasis was obtained.  The surrounding skin was undermined and advanced and closed in layers with interrupted buried Monocryl sutures and interrupted 4-0 Monocryl for the skin.  Power assisted liposuction was then performed using the revolve system and a 5 mm cannula.  I aspirated to the maximum level on the container and the fat was then washed according to the manufactures instructions.  Fat grafting was then performed through multiple access points to the right and left breast taking care to distribute the fat evenly and in the areas where the concavity was greatest.  130 cc was infiltrated on each side.  Liposuction was previously performed to the left axilla with 50 to 75 cc of Lipo aspirate.  I then performed direct excision of right axillary tissue.  The tissue was excised with a 10 blade and cautery dissection.  Hemostasis was ensured.  The surrounding skin was undermined and advanced and closed in layers with interrupted buried INSORB stapler and a running 3 oh V-Loc.  All port and access point sites were closed with a 5-0 fast gut suture.  Soft compressive dressings were then applied.  The patient tolerated the procedure well.  There were no complications. The patient was allowed to wake from anesthesia, extubated and taken to the recovery room in satisfactory condition.

## 2023-02-06 NOTE — Discharge Instructions (Addendum)
Leave dressings in place for 24 hours.  You may then remove and shower.  Reapply compressive dressings afterwards and maintain some form of compression until your follow-up next week.  Avoid strenuous activity.  You may have Tylenol again after 12:45pm today, if needed.   Post Anesthesia Home Care Instructions  Activity: Get plenty of rest for the remainder of the day. A responsible individual must stay with you for 24 hours following the procedure.  For the next 24 hours, DO NOT: -Drive a car -Advertising copywriter -Drink alcoholic beverages -Take any medication unless instructed by your physician -Make any legal decisions or sign important papers.  Meals: Start with liquid foods such as gelatin or soup. Progress to regular foods as tolerated. Avoid greasy, spicy, heavy foods. If nausea and/or vomiting occur, drink only clear liquids until the nausea and/or vomiting subsides. Call your physician if vomiting continues.  Special Instructions/Symptoms: Your throat may feel dry or sore from the anesthesia or the breathing tube placed in your throat during surgery. If this causes discomfort, gargle with warm salt water. The discomfort should disappear within 24 hours.  If you had a scopolamine patch placed behind your ear for the management of post- operative nausea and/or vomiting  1. The medication in the patch is effective for 72 hours, after which it should be removed.  Wrap patch in a tissue and discard in the trash. Wash hands thoroughly with soap and water. 2. You may remove the patch earlier than 72 hours if you experience unpleasant side effects which may include dry mouth, dizziness or visual disturbances. 3. Avoid touching the patch. Wash your hands with soap and water after contact with the patch.   No tylenol until after 12:45pm if needed for pain today.

## 2023-02-06 NOTE — H&P (Signed)
Reason for Consult/CC: Breast reconstruction  Tammy Cooper is an 72 y.o. female.  HPI: Tammy Cooper is here after bilateral mastectomies and radiation.  She desires some reconstructive changes to help with her contour.  Were planning to remove some excess right axillary skin and do fat grafting from the abdomen to the mastectomy defects.  Will also do some liposuction of the left axilla and remove a growing changing lesion from the right shoulder.  Allergies:  Allergies  Allergen Reactions   Lipitor [Atorvastatin]     Muscle aches   Other Hives    Tammy Cooper ALLERGIC TO SURGICAL GLUE    Medications:  Current Facility-Administered Medications:    ceFAZolin (ANCEF) IVPB 2g/100 mL premix, 2 g, Intravenous, On Call to OR, Jianni Batten, Wendy Poet, MD   lactated ringers infusion, , Intravenous, Continuous, Gaynelle Adu, MD  Past Medical History:  Diagnosis Date   Anal fistula    Anxiety    Carotid stenosis, asymptomatic, right    1-39% right ICA stenosis and left subclavian stenosis   Complication of anesthesia    Depression    History of left breast cancer 2016   oncologist--- dr Pamelia Hoit;  dx 04/ 2016  DCIS HG, ER/PR +;  s/p left breast lumpectomy 07-31-2014,  completed radiation 10-31-2014, completed 5 yrs antiestrogen   History of recurrent cystitis    urologist--- dr Marya Lowden   History of stroke 09/2020   ocular cryptogenic stroke / cardioembolic followed by [facility] released 06/ 2023  to local eye doctor   History of transient ischemic attack (TIA) 2006   no residual   Hyperlipidemia    Hypertension    Malignant neoplasm of upper-outer quadrant of left breast in female, estrogen receptor negative (HCC) 02/2021   recurrence from previous left breast 2016;  oncologist--- dr Pamelia Hoit  ;   04-07-2021  s/p  bilateral masectomy,  IDC/  DCIS  ER/PR negative, Stage 1A   OA (osteoarthritis)    PONV (postoperative nausea and vomiting)    Spinal stenosis     Past Surgical History:   Procedure Laterality Date   ANAL EXAMINATION UNDER ANESTHESIA  10/21/2010   @[facility]  by dr d. Magnus Ivan;  excision anal skin tag   BREAST LUMPECTOMY WITH RADIOACTIVE SEED LOCALIZATION Left 07/31/2014   Procedure: LEFT BREAST LUMPECTOMY WITH RADIOACTIVE SEED LOCALIZATION;  Surgeon: Abigail Miyamoto, MD;  Location: [facility];  Service: General;  Laterality: Left;   COLONOSCOPY  07/2022   [facility]  dr Lenise Arena   HEMORROIDECTOMY     KNEE ARTHROSCOPY Left 05/07/2008   @[facility]  by dr Lequita Halt   LAPAROSCOPIC VAGINAL HYSTERECTOMY WITH SALPINGO OOPHORECTOMY  07/02/2001   @[facility]  by dr Senaida Ores ;   anterior & posterior repair/ dr Vernie Ammons did tension free vaginal tape procedure   POSTERIOR FUSION LUMBAR SPINE  07/12/2010   @[facility]  by dr Danielle Dess;  L4--5   SHOULDER ARTHROSCOPY DISTAL CLAVICLE EXCISION AND OPEN ROTATOR CUFF REPAIR Right 07/19/2000   @[facility]  by dr Sherlean Foot   TONSILLECTOMY AND ADENOIDECTOMY  1958   TOTAL KNEE ARTHROPLASTY Right 08/04/2015   Procedure: RIGHT TOTAL KNEE ARTHROPLASTY;  Surgeon: Durene Romans, MD;  Location: [facility];  Service: Orthopedics;  Laterality: Right;   TOTAL KNEE ARTHROPLASTY Left 12/01/2015   Procedure: LEFT TOTAL KNEE ARTHROPLASTY;  Surgeon: Durene Romans, MD;  Location: [facility];  Service: Orthopedics;  Laterality: Left;   TOTAL MASTECTOMY Bilateral 04/07/2021   Procedure: BILATERAL TOTAL MASTECTOMY;  Surgeon: Abigail Miyamoto, MD;  Location: [facility];  Service: General;  Laterality: Bilateral;    Family History  Problem Relation Age of Onset   Cancer Father        lung   Hypertension Mother    CAD Brother        s/p PCI   Heart attack Neg Hx     Social History:  reports that she has never smoked. She has never used smokeless tobacco. She reports current alcohol use. She reports that she does not use drugs.  Physical Exam Blood pressure (!) 148/58, pulse 72, temperature 98.6 F (37 C), temperature source Temporal, resp. rate 15, height 5\' 3"   (1.6 m), weight 99 kg, SpO2 97%. General: No acute distress  Cardiovascular: Regular rate and rhythm Pulmonary: Unlabored  No results found for this or any previous visit (from the past 48 hour(s)).  No results found.  Assessment/Plan: Will plan to move ahead with surgery today.  Risks and benefits were discussed.  Tammy Cooper wants to proceed.  [name] 02/06/2023, 7:45 AM

## 2023-02-06 NOTE — Anesthesia Procedure Notes (Signed)
Procedure Name: Intubation Date/Time: 02/06/2023 7:57 AM  Performed by: Jessica Priest, CRNAPre-anesthesia Checklist: Patient identified, Emergency Drugs available, Suction available, Patient being monitored and Timeout performed Patient Re-evaluated:Patient Re-evaluated prior to induction Oxygen Delivery Method: Circle system utilized Preoxygenation: Pre-oxygenation with 100% oxygen Induction Type: IV induction Ventilation: Mask ventilation without difficulty Laryngoscope Size: Mac and 4 Grade View: Grade II Tube type: Oral Tube size: 7.5 mm Number of attempts: 1 Airway Equipment and Method: Stylet, Oral airway and Video-laryngoscopy Placement Confirmation: ETT inserted through vocal cords under direct vision, positive ETCO2, breath sounds checked- equal and bilateral and CO2 detector Secured at: 23 cm Tube secured with: Tape Dental Injury: Teeth and Oropharynx as per pre-operative assessment  Comments: Lips dry cracked preop - ointment applied

## 2023-02-07 ENCOUNTER — Encounter (HOSPITAL_BASED_OUTPATIENT_CLINIC_OR_DEPARTMENT_OTHER): Payer: Self-pay | Admitting: Plastic Surgery

## 2023-02-09 LAB — SURGICAL PATHOLOGY

## 2023-04-10 DIAGNOSIS — Z1331 Encounter for screening for depression: Secondary | ICD-10-CM | POA: Diagnosis not present

## 2023-04-10 DIAGNOSIS — Z23 Encounter for immunization: Secondary | ICD-10-CM | POA: Diagnosis not present

## 2023-04-10 DIAGNOSIS — E7800 Pure hypercholesterolemia, unspecified: Secondary | ICD-10-CM | POA: Diagnosis not present

## 2023-04-10 DIAGNOSIS — H6121 Impacted cerumen, right ear: Secondary | ICD-10-CM | POA: Diagnosis not present

## 2023-04-10 DIAGNOSIS — H6122 Impacted cerumen, left ear: Secondary | ICD-10-CM | POA: Diagnosis not present

## 2023-04-10 DIAGNOSIS — I700 Atherosclerosis of aorta: Secondary | ICD-10-CM | POA: Diagnosis not present

## 2023-04-10 DIAGNOSIS — B372 Candidiasis of skin and nail: Secondary | ICD-10-CM | POA: Diagnosis not present

## 2023-04-10 DIAGNOSIS — Z0000 Encounter for general adult medical examination without abnormal findings: Secondary | ICD-10-CM | POA: Diagnosis not present

## 2023-04-10 DIAGNOSIS — I10 Essential (primary) hypertension: Secondary | ICD-10-CM | POA: Diagnosis not present

## 2023-05-02 ENCOUNTER — Ambulatory Visit: Payer: Medicare PPO | Attending: Cardiology | Admitting: Cardiology

## 2023-05-02 ENCOUNTER — Encounter: Payer: Self-pay | Admitting: Cardiology

## 2023-05-02 VITALS — BP 138/52 | HR 71 | Ht 63.0 in | Wt 211.2 lb

## 2023-05-02 DIAGNOSIS — Z01812 Encounter for preprocedural laboratory examination: Secondary | ICD-10-CM | POA: Diagnosis not present

## 2023-05-02 DIAGNOSIS — R079 Chest pain, unspecified: Secondary | ICD-10-CM

## 2023-05-02 DIAGNOSIS — I6521 Occlusion and stenosis of right carotid artery: Secondary | ICD-10-CM

## 2023-05-02 DIAGNOSIS — I10 Essential (primary) hypertension: Secondary | ICD-10-CM

## 2023-05-02 DIAGNOSIS — Z79899 Other long term (current) drug therapy: Secondary | ICD-10-CM | POA: Diagnosis not present

## 2023-05-02 DIAGNOSIS — I639 Cerebral infarction, unspecified: Secondary | ICD-10-CM | POA: Diagnosis not present

## 2023-05-02 DIAGNOSIS — E785 Hyperlipidemia, unspecified: Secondary | ICD-10-CM

## 2023-05-02 MED ORDER — ROSUVASTATIN CALCIUM 10 MG PO TABS: 10.0000 mg | ORAL_TABLET | Freq: Every day | ORAL | 3 refills | Status: DC

## 2023-05-02 MED ORDER — METOPROLOL TARTRATE 100 MG PO TABS: 100.0000 mg | ORAL_TABLET | Freq: Once | ORAL | 0 refills | Status: AC

## 2023-05-02 MED ORDER — ADULT MULTIVITAMIN W/MINERALS CH: 1.0000 | ORAL_TABLET | Freq: Every day | ORAL | 12 refills | Status: AC

## 2023-05-02 MED ORDER — TELMISARTAN 40 MG PO TABS: 40.0000 mg | ORAL_TABLET | Freq: Every day | ORAL | 3 refills | Status: AC

## 2023-05-02 MED ORDER — EZETIMIBE 10 MG PO TABS: 10.0000 mg | ORAL_TABLET | Freq: Every day | ORAL | 3 refills | Status: AC

## 2023-05-02 NOTE — Progress Notes (Signed)
 Date:  05/02/2023   ID:  Wana Mount Hamilton-Smith, DOB October 23, 1950, MRN [id number]   PCP:  Sun, Vyvyan, MD  Cardiologist:  Wilbert Bihari, MD Electrophysiologist:  OLE ONEIDA HOLTS, MD   Chief Complaint:  Hypertension and Hyperlipidemia  History of Present Illness:    Tammy Cooper is a 73 y.o. female with a hx of HTN, carotid stenosis and dyslipidemia.  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  2D echo was normal, carotid dopplers showed 1-39% RICA stenosis and event monitor was normal.     Unfortunately she was dx with a reoccurrence of  breast CA and is s/p bilateral mastectomy.  During that surgery her loop recorder had to be removed.  After discussing with Dr. Holts she wanted to wait until she had a reconstructive breast surgery prior to reimplantation of her loop recorder.  She had her reconstruction done.  She is here today for followup.  She tells me that she has been having a sharp pinpoint pain on the left chest area and sometimes it feels like a muscle contraction that she feels that is tightening and loosening. This has occurred 3 times this fall.   She is worried and wants to make sure it is not her heart.  The pain is nonexertional and usually it occurs when she is sitting.  There is no associated sx of SOB or nausea.  She may feel a little warm but no frank sweating.  It usually lasts no more than 3 minutes at a time. Turning to her left and stretching to reach for something will bring it on.   She denies any exertional chest pain or pressure, SOB, DOE, PND, orthopnea, LE edema, dizziness, palpitations or syncope. She is compliant with her meds and is tolerating meds with no SE.    Prior CV studies:   The following studies were reviewed today:  EKG, 2D echo, event monitor, carotid dopplers  Past Medical History:  Diagnosis Date   Anal fistula    Anxiety    Carotid stenosis, asymptomatic, right    1-39% right ICA  stenosis and left subclavian stenosis   Complication of anesthesia    Depression    History of left breast cancer 2016   oncologist--- dr odean;  dx 04/ 2016  DCIS HG, ER/PR +;  s/p left breast lumpectomy 07-31-2014,  completed radiation 10-31-2014, completed 5 yrs antiestrogen   History of recurrent cystitis    urologist--- dr pace   History of stroke 09/2020   ocular cryptogenic stroke / cardioembolic followed by [facility] released 06/ 2023  to local eye doctor   History of transient ischemic attack (TIA) 2006   no residual   Hyperlipidemia    Hypertension    Malignant neoplasm of upper-outer quadrant of left breast in female, estrogen receptor negative (HCC) 02/2021   recurrence from previous left breast 2016;  oncologist--- dr odean  ;   04-07-2021  s/p  bilateral masectomy,  IDC/  DCIS  ER/PR negative, Stage 1A   OA (osteoarthritis)    PONV (postoperative nausea and vomiting)    Spinal stenosis    Past Surgical History:  Procedure Laterality Date   ANAL EXAMINATION UNDER ANESTHESIA  10/21/2010   @[facility]  by dr d. vernetta;  excision anal skin tag   BREAST LUMPECTOMY WITH RADIOACTIVE SEED LOCALIZATION Left 07/31/2014   Procedure: LEFT BREAST LUMPECTOMY WITH RADIOACTIVE SEED LOCALIZATION;  Surgeon: Vicenta Vernetta, MD;  Location: [facility];  Service: General;  Laterality: Left;   COLONOSCOPY  07/2022   [facility]  dr derwin   EXCISION MASS UPPER EXTREMETIES  02/06/2023   Procedure: EXCISION MASS RIGHT SHOULDER;  Surgeon: Elisabeth Craig RAMAN, MD;  Location: [facility];  Service: Plastics;;   HEMORROIDECTOMY     KNEE ARTHROSCOPY Left 05/07/2008   @[facility]  by dr melodi   LAPAROSCOPIC VAGINAL HYSTERECTOMY WITH SALPINGO OOPHORECTOMY  07/02/2001   @[facility]  by dr estelle ;   anterior & posterior repair/ dr ceil did tension free vaginal tape procedure   LIPOSUCTION WITH LIPOFILLING Bilateral 02/06/2023   Procedure: BILATERAL BREAST FAT GRAFTING;  Surgeon:  Elisabeth Craig RAMAN, MD;  Location: [facility];  Service: Plastics;  Laterality: Bilateral;   MASS EXCISION Right 02/06/2023   Procedure: EXCISION OF RIGHT AXILLARY SKIN;  Surgeon: Elisabeth Craig RAMAN, MD;  Location: [facility];  Service: Plastics;  Laterality: Right;   POSTERIOR FUSION LUMBAR SPINE  07/12/2010   @[facility]  by dr colon;  L4--5   SHOULDER ARTHROSCOPY DISTAL CLAVICLE EXCISION AND OPEN ROTATOR CUFF REPAIR Right 07/19/2000   @[facility]  by dr rubie   TONSILLECTOMY AND ADENOIDECTOMY  1958   TOTAL KNEE ARTHROPLASTY Right 08/04/2015   Procedure: RIGHT TOTAL KNEE ARTHROPLASTY;  Surgeon: Donnice Car, MD;  Location: [facility];  Service: Orthopedics;  Laterality: Right;   TOTAL KNEE ARTHROPLASTY Left 12/01/2015   Procedure: LEFT TOTAL KNEE ARTHROPLASTY;  Surgeon: Donnice Car, MD;  Location: [facility];  Service: Orthopedics;  Laterality: Left;   TOTAL MASTECTOMY Bilateral 04/07/2021   Procedure: BILATERAL TOTAL MASTECTOMY;  Surgeon: Vernetta Berg, MD;  Location: [facility];  Service: General;  Laterality: Bilateral;     Current Meds  Medication Sig   aspirin  EC 81 MG tablet Take 1 tablet (81 mg total) by mouth daily. (Patient taking differently: Take 81 mg by mouth at bedtime.)   betamethasone dipropionate 0.05 % cream 1 application Externally Once a day   cholecalciferol (VITAMIN D3) 25 MCG (1000 UNIT) tablet Take 1,000 Units by mouth at bedtime.   co-enzyme Q-10 30 MG capsule Take 30 mg by mouth 3 (three) times daily.   ezetimibe  (ZETIA ) 10 MG tablet TAKE ONE TABLET BY MOUTH DAILY (Patient taking differently: Take 10 mg by mouth daily.)   Multiple Vitamin (MULTIVITAMIN WITH MINERALS) TABS tablet Take 1 tablet by mouth at bedtime.   nystatin cream (MYCOSTATIN) 1 application Externally Twice a day   rosuvastatin  (CRESTOR ) 10 MG tablet Take 1 tablet (10 mg total) by mouth daily. (Patient taking differently: Take 10 mg by mouth at bedtime.)   telmisartan   (MICARDIS ) 40 MG tablet Take 1 tablet (40 mg total) by mouth daily. (Patient taking differently: Take 40 mg by mouth at bedtime.)   zinc  gluconate 50 MG tablet Take 50 mg by mouth at bedtime.     Allergies:   Lipitor [atorvastatin] and Other   Social History   Tobacco Use   Smoking status: Never   Smokeless tobacco: Never  Vaping Use   Vaping status: Never Used  Substance Use Topics   Alcohol use: Yes    Comment: ocassionally   Drug use: Never     Family Hx: The patient's family history includes CAD in her brother; Cancer in her father; Hypertension in her mother. There is no history of Heart attack.  ROS:   Please see the history of present illness.     All other systems reviewed and are negative.  Labs/Other Tests and Data Reviewed:    EKG Interpretation Date/Time:  Tuesday May 02 2023 15:17:28 EST Ventricular Rate:  74 PR Interval:  162 QRS Duration:  90 QT Interval:  404 QTC Calculation: 448 R Axis:   59  Text Interpretation: Normal sinus rhythm with sinus arrhythmia Nonspecific ST abnormality When compared with ECG of 17-Jul-2015 11:08, No significant change was found Confirmed by Shlomo Corning ([id number]) on 05/02/2023 3:27:34 PM    Recent Labs: 01/09/2023: BUN 8; Creatinine, Ser 0.70; Hemoglobin 13.9; Potassium 3.9; Sodium 141   Recent Lipid Panel Lab Results  Component Value Date/Time   CHOL 133 10/23/2020 07:44 AM   CHOL 140 10/23/2015 07:34 AM   CHOL 160 09/02/2014 07:30 AM   TRIG 38 10/23/2020 07:44 AM   TRIG 139 10/23/2015 07:34 AM   TRIG 206 (H) 09/02/2014 07:30 AM   HDL 69 10/23/2020 07:44 AM   HDL 53 10/23/2015 07:34 AM   HDL 55 09/02/2014 07:30 AM   CHOLHDL 1.9 10/23/2020 07:44 AM   CHOLHDL 2.6 10/23/2015 07:34 AM   CHOLHDL 3.1 CALC 05/29/2006 10:13 AM   LDLCALC 54 10/23/2020 07:44 AM   LDLCALC 59 10/23/2015 07:34 AM   LDLCALC 64 09/02/2014 07:30 AM    Wt Readings from Last 3 Encounters:  05/02/23 211 lb 3.2 oz (95.8 kg)  02/06/23 218 lb  4.1 oz (99 kg)  01/30/23 217 lb 6.4 oz (98.6 kg)     Objective:    Vital Signs:  BP (!) 138/52   Pulse 71   Ht 5' 3 (1.6 m)   Wt 211 lb 3.2 oz (95.8 kg)   SpO2 94%   BMI 37.41 kg/m    GEN: Well nourished, well developed in no acute distress HEENT: Normal NECK: No JVD; No carotid bruits LYMPHATICS: No lymphadenopathy CARDIAC:RRR, no murmurs, rubs, gallops RESPIRATORY:  Clear to auscultation without rales, wheezing or rhonchi  ABDOMEN: Soft, non-tender, non-distended MUSCULOSKELETAL:  No edema; No deformity  SKIN: Warm and dry NEUROLOGIC:  Alert and oriented x 3 PSYCHIATRIC:  Normal affect   ASSESSMENT & PLAN:    Hypertension  -BP is controlled on exam today -Continue prescription drug management with Micardis  40 mg daily with as needed refills  -I have personally reviewed and interpreted outside labs performed by patient's PCP which showed serum creatinine 0.7 and potassium 3.9 on 16 2024  Hyperlipidemia  -Her LDL goal is < 70.   -I have personally reviewed and interpreted outside labs performed by patient's PCP which showed LDL 59 and HDL 60 on 04/01/2022 -Repeat FLP and ALT -Continue prescription drug management with Crestor  10 mg daily and Zetia  10 mg daily with PRN Refills  Right carotid artery stenosis -1-39% right ICA stenosis 10/12/2020 -Continue aspirin  81 mg daily and statin therapy -Repeat carotid Dopplers  Cryptogenic Ocular CVA -unclear etiology but ophthalmologist feels this was cardioembolic -2D echo with normal LVF -carotid dopplers with only mild RICA stenosis -event monitor with no PAF -S/P ILR by EP in 2022 but then had to have bilateral mastectomies a few months later and it had to be removed during the surgery.   -Discussed with Dr. Cindie and he wanted her to have her reconstruction breast surgery prior to reimplanting the loop recorder -she has had her reconstructive surgery although is very unhappy with it but at this time is not contemplating  redo surgery -will refer back to Dr. Cindie  Atypical Chest pain - I suspect that this is MSK from her mastectomy and reconstruction  surgeries as she has extensive scarring over both breasts - as it is nonexertional and worse with certain movements - she had minimal disease on Cor CTA 2019 and I think we need to repeat this study to rule out progression of her CAD  Medication Adjustments/Labs and Tests Ordered: Current medicines are reviewed at length with the patient today.  Concerns regarding medicines are outlined above.   Tests Ordered: Orders Placed This Encounter  Procedures   EKG 12-Lead    Medication Changes: No orders of the defined types were placed in this encounter.    Disposition:  Follow up in 1 year(s)  Signed, Wilbert Bihari, MD  05/02/2023 3:28 PM    [facility]

## 2023-05-02 NOTE — Addendum Note (Signed)
 Addended by: Luellen Pucker on: 05/02/2023 03:57 PM   Modules accepted: Orders

## 2023-05-02 NOTE — Addendum Note (Signed)
 Addended by: Luellen Pucker on: 05/02/2023 03:55 PM   Modules accepted: Orders

## 2023-05-02 NOTE — Patient Instructions (Addendum)
 Medication Instructions:  Your physician recommends that you continue on your current medications as directed. Please refer to the Current Medication list given to you today.  *If you need a refill on your cardiac medications before your next appointment, please call your pharmacy*   Lab Work: Please complete a FASTING lipid panel, ALT and BMET at any [facility] as soon as possible.  If you have labs (blood work) drawn today and your tests are completely normal, you will receive your results only by: MyChart Message (if you have MyChart) OR A paper copy in the mail If you have any lab test that is abnormal or we need to change your treatment, we will call you to review the results.   Testing/Procedures: Your physician has requested that you have a carotid duplex. This test is an ultrasound of the carotid arteries in your neck. It looks at blood flow through these arteries that supply the brain with blood. Allow one hour for this exam. There are no restrictions or special instructions.    Your cardiac CT will be scheduled at:   [facility] [address] [place], [place] [address] [phone]  Please arrive at the [facility] Entrance (Entrance C2) of [facility] 30 minutes prior to test start time. You can use the FREE valet parking offered at entrance C (encouraged to control the heart rate for the test)  Proceed to the [facility] Radiology Department (first floor) to check-in and test prep.  All radiology patients and guests should use entrance C2 at [facility], accessed from [facility], even though the hospital's physical address listed is [address].     Please follow these instructions carefully (unless otherwise directed):  An IV will be required for this test and Nitroglycerin  will be given.  Hold all erectile dysfunction medications at least 3 days (72 hrs) prior to test. (Ie viagra, cialis, sildenafil,  tadalafil, etc)   On the Night Before the Test: Be sure to Drink plenty of water . Do not consume any caffeinated/decaffeinated beverages or chocolate 12 hours prior to your test. Do not take any antihistamines 12 hours prior to your test.  On the Day of the Test: Drink plenty of water  until 1 hour prior to the test. Do not eat any food 1 hour prior to test. You may take your regular medications prior to the test.  Take 100 mg of metoprolol  (Lopressor ) two hours prior to test. This is a one time dose. If you take Furosemide/Hydrochlorothiazide /Spironolactone/Chlorthalidone, please HOLD on the morning of the test. Patients who wear a continuous glucose monitor MUST remove the device prior to scanning. FEMALES- please wear underwire-free bra if available, avoid dresses & tight clothing       After the Test: Drink plenty of water . After receiving IV contrast, you may experience a mild flushed feeling. This is normal. On occasion, you may experience a mild rash up to 24 hours after the test. This is not dangerous. If this occurs, you can take Benadryl  25 mg and increase your fluid intake. If you experience trouble breathing, this can be serious. If it is severe call 911 IMMEDIATELY. If it is mild, please call our office.  We will call to schedule your test 2-4 weeks out understanding that some insurance companies will need an authorization prior to the service being performed.   For more information and frequently asked questions, please visit our website : [email or web address]  For non-scheduling related questions, please  contact the cardiac imaging nurse navigator should you have any questions/concerns: Cardiac Imaging Nurse Navigators Direct Office Dial: [phone]   For scheduling needs, including cancellations and rescheduling, please call Brittany, [phone].     Follow-Up: At [facility], you and your health needs are our priority.  As part of our  continuing mission to provide you with exceptional heart care, we have created designated Provider Care Teams.  These Care Teams include your primary Cardiologist (physician) and Advanced Practice Providers (APPs -  Physician Assistants and Nurse Practitioners) who all work together to provide you with the care you need, when you need it.  We recommend signing up for the patient portal called MyChart.  Sign up information is provided on this After Visit Summary.  MyChart is used to connect with patients for Virtual Visits (Telemedicine).  Patients are able to view lab/test results, encounter notes, upcoming appointments, etc.  Non-urgent messages can be sent to your provider as well.   To learn more about what you can do with MyChart, go to [email or web address].    Your next appointment:   1 year(s)  Provider:   Wilbert Bihari, MD     Other Instructions Dr. Bihari would like to refer you back to Dr. Cindie for placement of implanted loop recorder. Someone will call you to set up an appointment.

## 2023-05-09 ENCOUNTER — Other Ambulatory Visit: Payer: Self-pay

## 2023-05-09 ENCOUNTER — Telehealth: Payer: Self-pay

## 2023-05-09 DIAGNOSIS — Z79899 Other long term (current) drug therapy: Secondary | ICD-10-CM

## 2023-05-09 LAB — LIPID PANEL
Chol/HDL Ratio: 3 {ratio} (ref 0.0–4.4)
Cholesterol, Total: 155 mg/dL (ref 100–199)
HDL: 52 mg/dL (ref >39)
LDL Chol Calc (NIH): 79 mg/dL (ref 0–99)
Triglycerides: 139 mg/dL (ref 0–149)
VLDL Cholesterol Cal: 24 mg/dL (ref 5–40)

## 2023-05-09 LAB — BASIC METABOLIC PANEL
BUN/Creatinine Ratio: 19 (ref 12–28)
BUN: 15 mg/dL (ref 8–27)
CO2: 24 mmol/L (ref 20–29)
Calcium: 10 mg/dL (ref 8.7–10.3)
Chloride: 102 mmol/L (ref 96–106)
Creatinine, Ser: 0.78 mg/dL (ref 0.57–1.00)
Glucose: 110 mg/dL — ABNORMAL HIGH (ref 70–99)
Potassium: 5 mmol/L (ref 3.5–5.2)
Sodium: 142 mmol/L (ref 134–144)
eGFR: 80 mL/min/{1.73_m2} (ref >59)

## 2023-05-09 LAB — ALT: ALT: 32 [IU]/L (ref 0–32)

## 2023-05-09 NOTE — Telephone Encounter (Signed)
 Alcario Drought called to have labs released. She states she could not see them.

## 2023-05-12 ENCOUNTER — Encounter (HOSPITAL_COMMUNITY): Payer: Self-pay

## 2023-05-16 ENCOUNTER — Encounter (HOSPITAL_COMMUNITY): Payer: Self-pay

## 2023-05-16 ENCOUNTER — Ambulatory Visit (HOSPITAL_COMMUNITY): Admission: RE | Admit: 2023-05-16 | Payer: Medicare PPO | Source: Ambulatory Visit

## 2023-05-18 ENCOUNTER — Other Ambulatory Visit: Payer: Self-pay

## 2023-05-18 ENCOUNTER — Ambulatory Visit (HOSPITAL_COMMUNITY)
Admission: RE | Admit: 2023-05-18 | Discharge: 2023-05-18 | Disposition: A | Discharge status: Home / Self Care | Payer: Medicare PPO | Source: Ambulatory Visit | Attending: Cardiology | Admitting: Cardiology

## 2023-05-18 ENCOUNTER — Telehealth: Payer: Self-pay

## 2023-05-18 ENCOUNTER — Encounter: Payer: Self-pay | Admitting: Cardiology

## 2023-05-18 DIAGNOSIS — E7800 Pure hypercholesterolemia, unspecified: Secondary | ICD-10-CM

## 2023-05-18 DIAGNOSIS — I6521 Occlusion and stenosis of right carotid artery: Secondary | ICD-10-CM | POA: Diagnosis not present

## 2023-05-18 DIAGNOSIS — Z79899 Other long term (current) drug therapy: Secondary | ICD-10-CM

## 2023-05-18 MED ORDER — ROSUVASTATIN CALCIUM 20 MG PO TABS
20.0000 mg | ORAL_TABLET | Freq: Every day | ORAL | 3 refills | Status: AC
Start: 2023-05-18 — End: 2024-02-26

## 2023-05-18 NOTE — Telephone Encounter (Signed)
-----   Message from Armanda Magic sent at 05/11/2023  4:02 PM EST ----- LDL not at goal >>increase Crestor to 20mg  daily and repeat FLP and ALT in 6 weeks

## 2023-05-18 NOTE — Telephone Encounter (Signed)
Call to patient to advise that Dr. Mayford Knife recommends she increase crestor to 20 mg daily as LDL not at goal. Patient verbalizes understanding and agrees to increase as well as repeat fasting labs in 6 weeks. Orders placed, labs released.

## 2023-05-19 ENCOUNTER — Telehealth: Payer: Self-pay

## 2023-05-19 DIAGNOSIS — I6521 Occlusion and stenosis of right carotid artery: Secondary | ICD-10-CM

## 2023-05-19 NOTE — Telephone Encounter (Signed)
-----   Message from Armanda Magic sent at 05/18/2023  4:51 PM EST ----- 1 to 39% right carotid artery stenosis normal left side -repeat Dopplers 04/2025

## 2023-05-19 NOTE — Telephone Encounter (Signed)
Call to patient to explain that carotid scan shows 1 to 39% right carotid artery stenosis, normal left side per Dr. Mayford Knife. Dr. Mayford Knife recommends repeat Dopplers 04/2025, patient verbalizes understanding and agrees to plan.

## 2023-05-24 ENCOUNTER — Telehealth (HOSPITAL_COMMUNITY): Payer: Self-pay | Admitting: *Deleted

## 2023-05-24 NOTE — Telephone Encounter (Signed)
Reaching out to patient to offer assistance regarding upcoming cardiac imaging study; pt verbalizes understanding of appt date/time, parking situation and where to check in, pre-test NPO status and medications ordered, and verified current allergies; name and call back number provided for further questions should they arise Johney Frame RN Navigator Cardiac Imaging [facility] [phone] office [phone] cell

## 2023-05-25 ENCOUNTER — Encounter: Payer: Self-pay | Admitting: Cardiology

## 2023-05-25 ENCOUNTER — Ambulatory Visit (HOSPITAL_COMMUNITY)
Admission: RE | Admit: 2023-05-25 | Discharge: 2023-05-25 | Disposition: A | Discharge status: Home / Self Care | Payer: Medicare PPO | Source: Ambulatory Visit | Attending: Cardiology | Admitting: Cardiology

## 2023-05-25 ENCOUNTER — Telehealth: Payer: Self-pay

## 2023-05-25 DIAGNOSIS — R079 Chest pain, unspecified: Secondary | ICD-10-CM | POA: Diagnosis not present

## 2023-05-25 DIAGNOSIS — I2510 Atherosclerotic heart disease of native coronary artery without angina pectoris: Secondary | ICD-10-CM | POA: Insufficient documentation

## 2023-05-25 MED ORDER — NITROGLYCERIN 0.4 MG SL SUBL
0.8000 mg | SUBLINGUAL_TABLET | Freq: Once | SUBLINGUAL | Status: AC
  Administered 2023-05-25: 0.8 mg via SUBLINGUAL

## 2023-05-25 MED ORDER — IOHEXOL 350 MG/ML SOLN
100.0000 mL | Freq: Once | INTRAVENOUS | Status: AC | PRN
  Administered 2023-05-25: 100 mL via INTRAVENOUS

## 2023-05-25 MED ORDER — DILTIAZEM HCL 25 MG/5ML IV SOLN: 10.0000 mg | INTRAVENOUS | Status: DC | PRN

## 2023-05-25 MED ORDER — NITROGLYCERIN 0.4 MG SL SUBL
SUBLINGUAL_TABLET | SUBLINGUAL | Status: AC
  Filled 2023-05-25: qty 2

## 2023-05-25 MED ORDER — METOPROLOL TARTRATE 5 MG/5ML IV SOLN: 10.0000 mg | Freq: Once | INTRAVENOUS | Status: DC | PRN

## 2023-05-25 NOTE — Telephone Encounter (Signed)
-----   Message from Armanda Magic sent at 05/25/2023 11:07 AM EST ----- Coronary CTA showed a coronary calcium score of 160 with< 25% calcified plaque in the LM/prox LAD/prox LCx./ prox and distal RCA and 25 to 49% mid LAD stenosis.  This is unlikely to be the etiology of her chest pain which I felt was likely musculoskeletal when I saw her in the office.  Continue aspirin as well as statin therapy.  Review of lipids from this month showed an LDL of 79 and her Crestor was increased and she should have labs pending in another 5 to 6 weeks

## 2023-05-25 NOTE — Progress Notes (Signed)
Patient tolerated CT well. VSS. Encouraged to drink water throughout day. Reasons explained and verbalized understanding. Ambulated, steady gate.

## 2023-05-25 NOTE — Telephone Encounter (Signed)
Call to patient to discuss coronary CTA results.  Coronary CTA showed a coronary calcium score of 160 with< 25% calcified plaque in the LM/prox LAD/prox LCx./ prox and distal RCA and 25 to 49% mid LAD stenosis.  This is unlikely to be the etiology of her chest pain which I felt was likely musculoskeletal when I saw her in the office.    Continue aspirin as well as statin therapy.  Review of lipids from this month showed an LDL of 79 and her Crestor was increased and she should have labs pending in another 5 to 6 weeks   Patient verbalizes understanding and agrees to above plan.

## 2023-06-02 ENCOUNTER — Ambulatory Visit: Payer: Medicare PPO | Admitting: Podiatrist

## 2023-06-02 DIAGNOSIS — B351 Tinea unguium: Secondary | ICD-10-CM

## 2023-06-02 NOTE — Progress Notes (Signed)
 Patient presents today for laser treatment # 4 ( 6/2j8/24- was date of laser 3 treatment)   . Diagnosed with mycotic nail infection by Dr. Magdalen .   Toenail most affected left hallux-  right hallux has remained clear.   All other systems are negative.  Nails were filed thin. Laser therapy was administered to 1-5 toenails bilaterally and patient tolerated the treatment well. All safety precautions were in place.   Single laser pass was done on non-affected nails.   Follow up in 4 weeks for laser # 5 (resuming treatment so spacing out 4 weeks)

## 2023-06-05 ENCOUNTER — Telehealth: Payer: Self-pay

## 2023-06-05 NOTE — Telephone Encounter (Signed)
-----   Message from Gaylyn Keas sent at 06/04/2023  8:19 PM EST ----- Normal non coronary portion of chest CT

## 2023-06-05 NOTE — Telephone Encounter (Signed)
 Call to patient to discuss normal non coronary portion of chest CT. Patient verbalizes understanding.

## 2023-06-14 ENCOUNTER — Ambulatory Visit: Payer: Medicare PPO | Admitting: Cardiology

## 2023-06-20 DIAGNOSIS — L821 Other seborrheic keratosis: Secondary | ICD-10-CM | POA: Diagnosis not present

## 2023-06-20 DIAGNOSIS — L304 Erythema intertrigo: Secondary | ICD-10-CM | POA: Diagnosis not present

## 2023-06-20 DIAGNOSIS — L718 Other rosacea: Secondary | ICD-10-CM | POA: Diagnosis not present

## 2023-06-26 ENCOUNTER — Ambulatory Visit (INDEPENDENT_AMBULATORY_CARE_PROVIDER_SITE_OTHER): Payer: Medicare PPO | Admitting: Podiatrist

## 2023-06-26 DIAGNOSIS — B351 Tinea unguium: Secondary | ICD-10-CM

## 2023-06-26 NOTE — Progress Notes (Signed)
 Patient presents today for laser treatment # 5 (round 2)    . Diagnosed with mycotic nail infection by Dr. Charlsie Merles .   Toenail most affected left hallux-  right hallux has remained clear.   All other systems are negative.  Medial aspect left hallux nail was trimmed and filed thin. Laser therapy was administered to 1-5 toenails bilaterally and patient tolerated the treatment well. All safety precautions were in place.   Single laser pass was done on non-affected nails.   Follow up in 4 weeks for laser # 6 (resuming treatment so spacing out 4 weeks x 3 more treatments )

## 2023-07-25 NOTE — Progress Notes (Unsigned)
 Electrophysiology Office Follow up Visit Note:    Date:  07/25/2023   ID:  Tammy Cooper, DOB 1950/12/26, MRN [id number]  PCP:  Deatra James, MD  [facility] Cardiologist:  Armanda Magic, MD  [facility] Electrophysiologist:  Lanier Prude, MD    Interval History:     Tammy Cooper is a 73 y.o. female who presents for a follow up visit.   I last saw the patient in January 12, 2021 for cryptogenic stroke.  A loop recorder was implanted at that appointment.  She subsequently had breast surgery and included removal of the loop recorder.  She presents today to have the loop recorder reimplanted.***        Past medical, surgical, social and family history were reviewed.  ROS:   Please see the history of present illness.    All other systems reviewed and are negative.  EKGs/Labs/Other Studies Reviewed:    The following studies were reviewed today:          Physical Exam:    VS:  There were no vitals taken for this visit.    Wt Readings from Last 3 Encounters:  05/02/23 211 lb 3.2 oz (95.8 kg)  02/06/23 218 lb 4.1 oz (99 kg)  01/30/23 217 lb 6.4 oz (98.6 kg)     GEN: no distress CARD: RRR, No MRG RESP: No IWOB. CTAB.      ASSESSMENT:    No diagnosis found. PLAN:    In order of problems listed above:  #Cryptogenic Stroke Pathophysiology of cryptogenic stroke was discussed in detail during today's clinic appointment. I discussed the role of loop recorder monitoring in patients who have suffered a CVA/TIA. There has been no evidence of AF thus far in the patient's evaluation. Loop recorder monitors were discussed in detail including the implant procedure and its risks. I discussed the monthly monitoring costs associated with loop recorder monitoring. The patient would like to proceed with ILR implant.    Signed, Steffanie Dunn, MD, [facility], [facility] 07/25/2023 4:38 PM    Electrophysiology [facility]  ------------------  SURGEON:  Lanier Prude, MD     PREPROCEDURE DIAGNOSIS:  Cryptogenic stroke    POSTPROCEDURE DIAGNOSIS: Cryptogenic stroke     PROCEDURES:   1. Implantable loop recorder implantation    INTRODUCTION:  Tammy Cooper presents with a history of cryptogenic stroke The costs of loop recorder monitoring have been discussed with the patient.    DESCRIPTION OF PROCEDURE:  Informed written consent was obtained.  A preprocedural timeout was performed with the RN Wenda Low). The patient required no sedation for the procedure today.  Mapping over the patient's chest was performed to identify the area where electrograms were most prominent for ILR recording.  This area was found to be the left parasternal region over the 4th intercostal space. The patients left chest was therefore prepped and draped in the usual sterile fashion. The skin overlying the left parasternal region was infiltrated with lidocaine for local analgesia.  A 0.5-cm incision was made over the left parasternal region over the 3rd intercostal space.  A subcutaneous ILR pocket was fashioned using a combination of sharp and blunt dissection.  A {LOOPLIST:[id number]} implantable loop recorder was then placed into the pocket  R waves were very prominent and measured >0.69mV.  Steri- Strips and a sterile dressing were then applied.  There were no early apparent complications.     CONCLUSIONS:   1. Successful implantation of a implantable  loop recorder for Cryptogenic stroke  2. No early apparent complications.   Sheria Lang T. Lalla Brothers, MD, [facility], [facility] Cardiac Electrophysiology

## 2023-07-26 ENCOUNTER — Ambulatory Visit: Payer: Medicare PPO | Attending: Cardiology | Admitting: Cardiology

## 2023-07-26 ENCOUNTER — Encounter: Payer: Self-pay | Admitting: Cardiology

## 2023-07-26 VITALS — BP 114/60 | HR 96 | Ht 63.0 in | Wt 217.0 lb

## 2023-07-26 DIAGNOSIS — I639 Cerebral infarction, unspecified: Secondary | ICD-10-CM

## 2023-07-26 NOTE — Patient Instructions (Signed)
Medication Instructions:  Your physician recommends that you continue on your current medications as directed. Please refer to the Current Medication list given to you today.  Labwork: None ordered.  Testing/Procedures: None ordered.  Follow-Up:  As needed with Dr. Lalla Brothers  Implantable Loop Recorder Placement, Care After This sheet gives you information about how to care for yourself after your procedure. Your health care provider may also give you more specific instructions. If you have problems or questions, contact your health care provider. What can I expect after the procedure? After the procedure, it is common to have: Soreness or discomfort near the incision. Some swelling or bruising near the incision.  Follow these instructions at home: Incision care  Monitor your cardiac device site for redness, swelling, and drainage. Call the device clinic at [phone] if you experience these symptoms or fever/chills.  Keep the large square bandage on your site for 24 hours and then you may remove it yourself. Keep the steri-strips underneath in place.   You may shower after 72 hours / 3 days from your procedure with the steri-strips in place. They will usually fall off on their own, or may be removed after 10 days. Pat dry.   Avoid lotions, ointments, or perfumes over your incision until it is well-healed.  Please do not submerge in water until your site is completely healed.   Your device is MRI compatible.   Remote monitoring is used to monitor your cardiac device from home. This monitoring is scheduled every month by our office. It allows [place] to keep an eye on the function of your device to ensure it is working properly.  If your wound site starts to bleed apply pressure.    For help with the monitor please call [employer] Monitor Support Specialist directly at [phone].    If you have any questions/concerns please call the device clinic at  [phone].  Activity  Return to your normal activities.  General instructions Follow instructions from your health care provider about how to manage your implantable loop recorder and transmit the information. Learn how to activate a recording if this is necessary for your type of device. You may go through a metal detection gate, and you may let someone hold a metal detector over your chest. Show your ID card if needed. Do not have an MRI unless you check with your health care provider first. Take over-the-counter and prescription medicines only as told by your health care provider. Keep all follow-up visits as told by your health care provider. This is important. Contact a health care provider if: You have redness, swelling, or pain around your incision. You have a fever. You have pain that is not relieved by your pain medicine. You have triggered your device because of fainting (syncope) or because of a heartbeat that feels like it is racing, slow, fluttering, or skipping (palpitations). Get help right away if you have: Chest pain. Difficulty breathing. Summary After the procedure, it is common to have soreness or discomfort near the incision. Change your dressing as told by your health care provider. Follow instructions from your health care provider about how to manage your implantable loop recorder and transmit the information. Keep all follow-up visits as told by your health care provider. This is important. This information is not intended to replace advice given to you by your health care provider. Make sure you discuss any questions you have with your health care provider. Document Released: 03/23/2015 Document Revised: 05/27/2017 Document Reviewed: 05/27/2017 Elsevier Patient  Education  2020 [employer]  

## 2023-07-31 ENCOUNTER — Ambulatory Visit (INDEPENDENT_AMBULATORY_CARE_PROVIDER_SITE_OTHER): Admitting: Podiatrist

## 2023-07-31 DIAGNOSIS — B351 Tinea unguium: Secondary | ICD-10-CM

## 2023-08-02 NOTE — Progress Notes (Signed)
 Patient presents today for laser treatment # 6 (round 2)    . Diagnosed with mycotic nail infection by Dr. Charlsie Merles .   Toenail most affected left hallux-  right hallux has remained clear.   All other systems are negative.  Medial aspect left hallux nail was trimmed and filed thin. Laser therapy was administered to 1-5 toenails bilaterally and patient tolerated the treatment well. All safety precautions were in place.   Single laser pass was done on non-affected nails.   Follow up in 4 weeks for laser # 7 (resuming treatment so spacing out 4 weeks x 3 more treatments )

## 2023-08-30 ENCOUNTER — Ambulatory Visit (INDEPENDENT_AMBULATORY_CARE_PROVIDER_SITE_OTHER)

## 2023-08-30 DIAGNOSIS — I639 Cerebral infarction, unspecified: Secondary | ICD-10-CM | POA: Diagnosis not present

## 2023-08-31 LAB — CUP PACEART REMOTE DEVICE CHECK
Date Time Interrogation Session: 20250507145800
Implantable Pulse Generator Implant Date: 20250402

## 2023-09-03 ENCOUNTER — Encounter: Payer: Self-pay | Admitting: Cardiology

## 2023-09-05 DIAGNOSIS — C50912 Malignant neoplasm of unspecified site of left female breast: Secondary | ICD-10-CM | POA: Diagnosis not present

## 2023-09-05 DIAGNOSIS — Z9011 Acquired absence of right breast and nipple: Secondary | ICD-10-CM | POA: Diagnosis not present

## 2023-09-11 DIAGNOSIS — K648 Other hemorrhoids: Secondary | ICD-10-CM | POA: Diagnosis not present

## 2023-09-11 DIAGNOSIS — L290 Pruritus ani: Secondary | ICD-10-CM | POA: Diagnosis not present

## 2023-10-04 ENCOUNTER — Ambulatory Visit

## 2023-10-04 DIAGNOSIS — I639 Cerebral infarction, unspecified: Secondary | ICD-10-CM

## 2023-10-05 LAB — CUP PACEART REMOTE DEVICE CHECK
Date Time Interrogation Session: 20250610233047
Implantable Pulse Generator Implant Date: 20250402

## 2023-10-07 ENCOUNTER — Ambulatory Visit: Payer: Self-pay | Admitting: Cardiology

## 2023-10-09 NOTE — Addendum Note (Signed)
 Addended by: Lott Rouleau A on: 10/09/2023 12:44 PM   Modules accepted: Orders

## 2023-10-09 NOTE — Progress Notes (Signed)
 [facility] Summary Report / Loop Recorder

## 2023-10-13 ENCOUNTER — Ambulatory Visit (INDEPENDENT_AMBULATORY_CARE_PROVIDER_SITE_OTHER)

## 2023-10-13 DIAGNOSIS — B351 Tinea unguium: Secondary | ICD-10-CM

## 2023-10-13 NOTE — Progress Notes (Signed)
 Patient presents today for the 7th laser treatment. Diagnosed with mycotic nail infection by Dr. Celia Coles.   Toenail most affected left hallux. Some discoloration on the right hallux.  All other systems are negative.  Nails were filed thin. Laser therapy was administered to 1-5 toenails bilaterally and patient tolerated the treatment well. All safety precautions were in place.     Follow up in 4 weeks for laser # 8. (2 more treatments at 4 week intervals)

## 2023-11-06 ENCOUNTER — Ambulatory Visit: Payer: Self-pay | Admitting: Cardiology

## 2023-11-06 ENCOUNTER — Ambulatory Visit

## 2023-11-06 DIAGNOSIS — I639 Cerebral infarction, unspecified: Secondary | ICD-10-CM | POA: Diagnosis not present

## 2023-11-06 LAB — CUP PACEART REMOTE DEVICE CHECK
Date Time Interrogation Session: 20250714005611
Implantable Pulse Generator Implant Date: 20250402

## 2023-11-08 ENCOUNTER — Encounter

## 2023-11-11 DIAGNOSIS — J069 Acute upper respiratory infection, unspecified: Secondary | ICD-10-CM | POA: Diagnosis not present

## 2023-11-20 DIAGNOSIS — J209 Acute bronchitis, unspecified: Secondary | ICD-10-CM | POA: Diagnosis not present

## 2023-11-20 DIAGNOSIS — B372 Candidiasis of skin and nail: Secondary | ICD-10-CM | POA: Diagnosis not present

## 2023-11-21 ENCOUNTER — Ambulatory Visit (INDEPENDENT_AMBULATORY_CARE_PROVIDER_SITE_OTHER)

## 2023-11-21 DIAGNOSIS — B351 Tinea unguium: Secondary | ICD-10-CM

## 2023-11-21 NOTE — Progress Notes (Signed)
 Patient presents today for the 8th laser treatment. Diagnosed with mycotic nail infection by Dr. Magdalen.   Toenail most affected left hallux. All ten toenails are showing significant improvement in appearance. The nails are not as thick as last visit. She is still getting pedicures. Per patient, she would like to forego the remaining treatments due to how well the nails are looking. She will follow up with the provider in 3 months.   All other systems are negative.  Nails were filed thin. Laser therapy was administered to 1-5 toenails bilaterally and patient tolerated the treatment well. All safety precautions were in place.    Follow up in 3 months with Dr. Magdalen.

## 2023-11-30 NOTE — Progress Notes (Signed)
 [facility] Summary Report / Loop Recorder

## 2023-11-30 NOTE — Addendum Note (Signed)
 Addended by: VICCI SELLER A on: 11/30/2023 11:08 AM   Modules accepted: Orders

## 2023-12-07 ENCOUNTER — Ambulatory Visit (INDEPENDENT_AMBULATORY_CARE_PROVIDER_SITE_OTHER)

## 2023-12-07 DIAGNOSIS — I639 Cerebral infarction, unspecified: Secondary | ICD-10-CM | POA: Diagnosis not present

## 2023-12-07 LAB — CUP PACEART REMOTE DEVICE CHECK
Date Time Interrogation Session: 20250813234952
Implantable Pulse Generator Implant Date: 20250402

## 2023-12-08 ENCOUNTER — Ambulatory Visit: Payer: Self-pay | Admitting: Cardiology

## 2023-12-13 ENCOUNTER — Encounter

## 2023-12-13 DIAGNOSIS — L304 Erythema intertrigo: Secondary | ICD-10-CM | POA: Diagnosis not present

## 2024-01-08 ENCOUNTER — Ambulatory Visit (INDEPENDENT_AMBULATORY_CARE_PROVIDER_SITE_OTHER)

## 2024-01-08 DIAGNOSIS — I639 Cerebral infarction, unspecified: Secondary | ICD-10-CM | POA: Diagnosis not present

## 2024-01-09 LAB — CUP PACEART REMOTE DEVICE CHECK
Date Time Interrogation Session: 20250915000452
Implantable Pulse Generator Implant Date: 20250402

## 2024-01-11 ENCOUNTER — Ambulatory Visit: Payer: Self-pay | Admitting: Cardiology

## 2024-01-15 NOTE — Progress Notes (Signed)
 Remote Loop Recorder Transmission

## 2024-01-17 ENCOUNTER — Encounter

## 2024-01-18 NOTE — Progress Notes (Signed)
 Remote Loop Recorder Transmission

## 2024-01-30 ENCOUNTER — Inpatient Hospital Stay: Payer: Medicare PPO | Attending: Hematology and Oncology | Admitting: Hematology and Oncology

## 2024-01-30 VITALS — BP 142/54 | HR 70 | Temp 98.3°F | Resp 17 | Ht 63.0 in | Wt 221.3 lb

## 2024-01-30 DIAGNOSIS — C50412 Malignant neoplasm of upper-outer quadrant of left female breast: Secondary | ICD-10-CM

## 2024-01-30 DIAGNOSIS — Z923 Personal history of irradiation: Secondary | ICD-10-CM | POA: Insufficient documentation

## 2024-01-30 DIAGNOSIS — I4891 Unspecified atrial fibrillation: Secondary | ICD-10-CM | POA: Diagnosis not present

## 2024-01-30 DIAGNOSIS — Z171 Estrogen receptor negative status [ER-]: Secondary | ICD-10-CM | POA: Diagnosis not present

## 2024-01-30 DIAGNOSIS — Z853 Personal history of malignant neoplasm of breast: Secondary | ICD-10-CM | POA: Diagnosis not present

## 2024-01-30 DIAGNOSIS — Z9013 Acquired absence of bilateral breasts and nipples: Secondary | ICD-10-CM | POA: Insufficient documentation

## 2024-01-30 NOTE — Assessment & Plan Note (Signed)
 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 treatment: Adjuvant anastrozole  1 mg daily ( chosen because of history of TIA) since 11/18/2014-2021     Relapse/recurrence:Patient felt a lump and there were 2.5 cm of suspicious calcifications which on biopsy came back as microscopic focus of IDC with DCIS   04/07/2021: Bilateral mastectomies Right mastectomy: Fibrocystic changes Left mastectomy: 3 mm residual focus of grade 2 IDC and multiple foci of high-grade DCIS, margins negative, ER 0%, PR 0%, HER2 not performed, Ki-67 less than 1%    02/06/2023: Chest wall fat grafting and repair and reconstruction.  (By Dr. Elisabeth)   Breast cancer surveillance: No role of imaging since she had bilateral mastectomies. Chest wall examination 01/30/2024: Benign   Atrial Fibrillation: No reported episodes of Afib from September to December. Cardiology to provide clearance for upcoming surgery. -Continue current management and follow-up with cardiology as scheduled.   Treatment plan: Surveillance Return to clinic in 1 year for follow-up

## 2024-01-30 NOTE — Progress Notes (Signed)
 Patient Care Team: Sun, Vyvyan, MD as PCP - General (Family Medicine) Shlomo Wilbert SAUNDERS, MD as PCP - Cardiology (Cardiology) Cindie Ole DASEN, MD as PCP - Electrophysiology (Cardiology) Shlomo Wilbert SAUNDERS, MD as Consulting Physician (Cardiology) Odean Potts, MD as Consulting Physician (Hematology and Oncology) Vernetta Berg, MD as Consulting Physician (General Surgery)  DIAGNOSIS:  Encounter Diagnosis  Name Primary?   Malignant neoplasm of upper-outer quadrant of left breast in female, estrogen receptor negative (HCC) Yes    SUMMARY OF ONCOLOGIC HISTORY: Oncology History  Malignant neoplasm of upper-outer quadrant of left breast in female, estrogen receptor negative (HCC)  05/22/2014 Breast MRI   Left breast 12:00 position irregular nodular enhancement on 0.6 x 1.2 x 0.8 cm, no abnormal lymph nodes   07/31/2014 Surgery   Left breast lumpectomy: DCIS with calcifications 1 cm size ER 99%, PR 99%   09/29/2014 - 10/31/2014 Radiation Therapy   Adjuvant radiation   11/18/2014 -  Anti-estrogen oral therapy   anastrozole  1 mg daily 5 years   03/11/2021 Relapse/Recurrence   Patient felt a lump and there were 2.5 cm of suspicious calcifications which on biopsy came back as microscopic focus of IDC with DCIS   04/07/2021 Surgery   Bilateral mastectomies Right mastectomy: Fibrocystic changes Left mastectomy: 3 mm residual focus of grade 2 IDC and multiple foci of high-grade DCIS, margins negative, ER 0%, PR 0%, HER2 not performed, Ki-67 less than 1%   07/28/2021 Cancer Staging   Staging form: Breast, AJCC 7th Edition - Clinical: Stage IA (T1a, N0, M0) - Signed by Crawford Morna Pickle, NP on 07/28/2021     CHIEF COMPLIANT:   HISTORY OF PRESENT ILLNESS:  History of Present Illness Tammy Cooper is a 73 year old female with breast cancer who presents with concerns about hot flashes and breast cancer recurrence.  She experiences increased hot flashes over the  past year, with episodes of intense heat and sweating. She questions if her medications for blood pressure or cholesterol could contribute to these symptoms.  She is concerned about breast cancer recurrence, noting the absence of mammograms. She has had a mastectomy and a loop recorder implanted for atrial fibrillation, which she believes may exacerbate her symptoms.     ALLERGIES:  is allergic to lipitor [atorvastatin] and other.  MEDICATIONS:  Current Outpatient Medications  Medication Sig Dispense Refill   aspirin  EC 81 MG tablet Take 1 tablet (81 mg total) by mouth daily. (Patient taking differently: Take 81 mg by mouth at bedtime.) 90 tablet 3   cholecalciferol (VITAMIN D3) 25 MCG (1000 UNIT) tablet Take 1,000 Units by mouth at bedtime.     co-enzyme Q-10 30 MG capsule Take 30 mg by mouth 3 (three) times daily.     ezetimibe  (ZETIA ) 10 MG tablet Take 1 tablet (10 mg total) by mouth daily. 90 tablet 3   metoprolol  tartrate (LOPRESSOR ) 100 MG tablet Take 1 tablet (100 mg total) by mouth once for 1 dose. Take 90-120 minutes prior to scan. 1 tablet 0   Multiple Vitamin (MULTIVITAMIN WITH MINERALS) TABS tablet Take 1 tablet by mouth at bedtime. 30 tablet 12   rosuvastatin  (CRESTOR ) 20 MG tablet Take 1 tablet (20 mg total) by mouth daily. 90 tablet 3   telmisartan  (MICARDIS ) 40 MG tablet Take 1 tablet (40 mg total) by mouth daily. 90 tablet 3   zinc  gluconate 50 MG tablet Take 50 mg by mouth at bedtime.     No current facility-administered medications for  this visit.    PHYSICAL EXAMINATION: ECOG PERFORMANCE STATUS: 1 - Symptomatic but completely ambulatory  Vitals:   01/30/24 1006  BP: (!) 142/54  Pulse: 70  Resp: 17  Temp: 98.3 F (36.8 C)  SpO2: 96%   Filed Weights   01/30/24 1006  Weight: 221 lb 4.8 oz (100.4 kg)    Physical Exam   (exam performed in the presence of a chaperone)  LABORATORY DATA:  I have reviewed the data as listed    Latest Ref Rng & Units  05/09/2023    8:55 AM 01/09/2023   12:58 PM 12/08/2020   11:36 AM  CMP  Glucose 70 - 99 mg/dL 889  98  93   BUN 8 - 27 mg/dL 15  8  15    Creatinine 0.57 - 1.00 mg/dL 9.21  9.29  9.34   Sodium 134 - 144 mmol/L 142  141  142   Potassium 3.5 - 5.2 mmol/L 5.0  3.9  4.5   Chloride 96 - 106 mmol/L 102  104  102   CO2 20 - 29 mmol/L 24   21   Calcium  8.7 - 10.3 mg/dL 89.9   9.6   ALT 0 - 32 IU/L 32       Lab Results  Component Value Date   WBC 11.9 (H) 12/02/2015   HGB 13.9 01/09/2023   HCT 41.0 01/09/2023   MCV 89.8 12/02/2015   PLT 255 12/02/2015   NEUTROABS 3.8 12/07/2006    ASSESSMENT & PLAN:  Malignant neoplasm of upper-outer quadrant of left breast in female, 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 treatment: Adjuvant anastrozole  1 mg daily ( chosen because of history of TIA) since 11/18/2014-2021     Relapse/recurrence:Patient felt a lump and there were 2.5 cm of suspicious calcifications which on biopsy came back as microscopic focus of IDC with DCIS   04/07/2021: Bilateral mastectomies Right mastectomy: Fibrocystic changes Left mastectomy: 3 mm residual focus of grade 2 IDC and multiple foci of high-grade DCIS, margins negative, ER 0%, PR 0%, HER2 not performed, Ki-67 less than 1%    02/06/2023: Chest wall fat grafting and repair and reconstruction.  (By Dr. Elisabeth)   Breast cancer surveillance: No role of imaging since she had bilateral mastectomies. Chest wall examination 01/30/2024: Benign [employer] reveal for MRD testing   Atrial Fibrillation: Has a loop recorder and follows with cardiology.   Treatment plan: Surveillance Return to clinic in 1 year for follow-up         No orders of the defined types were placed in this encounter.  The patient has a good understanding of the overall plan. she agrees with it. she will call with any problems that may develop before the next  visit here.  I personally spent a total of 30 minutes in the care of the patient today including preparing to see the patient, getting/reviewing separately obtained history, performing a medically appropriate exam/evaluation, counseling and educating, placing orders, referring and communicating with other health care professionals, documenting clinical information in the EHR, independently interpreting results, communicating results, and coordinating care.   Viinay K Thurl Boen, MD 01/30/24

## 2024-01-31 NOTE — Progress Notes (Signed)
 Remote Loop Recorder Transmission

## 2024-02-01 ENCOUNTER — Telehealth: Payer: Self-pay

## 2024-02-01 NOTE — Telephone Encounter (Signed)
 Per md guardant reveal order was email/scan to [employer] rep.

## 2024-02-08 ENCOUNTER — Ambulatory Visit

## 2024-02-08 ENCOUNTER — Encounter

## 2024-02-08 DIAGNOSIS — I639 Cerebral infarction, unspecified: Secondary | ICD-10-CM

## 2024-02-08 LAB — CUP PACEART REMOTE DEVICE CHECK
Date Time Interrogation Session: 20251015234623
Implantable Pulse Generator Implant Date: 20250402

## 2024-02-12 ENCOUNTER — Ambulatory Visit: Payer: Self-pay | Admitting: Cardiology

## 2024-02-14 NOTE — Progress Notes (Signed)
 Remote Loop Recorder Transmission

## 2024-02-21 ENCOUNTER — Telehealth: Payer: Self-pay

## 2024-02-21 ENCOUNTER — Encounter

## 2024-02-21 NOTE — Telephone Encounter (Signed)
 Called pt per MD to advise [employer] testing was negative/not detected. Pt verbalized understanding of results and knows [employer] will be in touch to schedule 6 mo repeat lab.

## 2024-02-22 ENCOUNTER — Encounter: Payer: Self-pay | Admitting: Hematology and Oncology

## 2024-02-26 ENCOUNTER — Ambulatory Visit: Admitting: Podiatry

## 2024-02-26 ENCOUNTER — Encounter: Payer: Self-pay | Admitting: Podiatry

## 2024-02-26 DIAGNOSIS — B351 Tinea unguium: Secondary | ICD-10-CM | POA: Diagnosis not present

## 2024-02-26 DIAGNOSIS — M2041 Other hammer toe(s) (acquired), right foot: Secondary | ICD-10-CM | POA: Diagnosis not present

## 2024-02-26 DIAGNOSIS — M2042 Other hammer toe(s) (acquired), left foot: Secondary | ICD-10-CM

## 2024-02-26 NOTE — Progress Notes (Signed)
 Subjective:   Patient ID: Tammy Cooper, female   DOB: 73 y.o.   MRN: [id number]   HPI Patient states she seems to be improved with her nailbeds even though there still is some moderate discoloration along her tendon on her ankle seems to be doing well   ROS      Objective:  Physical Exam  Neurovascular status intact improvement of discoloration pattern of nailbeds bilateral localized with no proximal pathology currently with minimal inflammation around the tendon     Assessment:  Several problems that appear to be improved with tendinitis and nail disease bilateral     Plan:  H&P reviewed both conditions discussed nails continuation of oral medication but at this point organ to hold off treat conservatively.  I also discussed tendon do not recommend treatment currently as it seems to be stable discussed good shoe gear usage and support

## 2024-03-10 ENCOUNTER — Encounter

## 2024-03-11 ENCOUNTER — Encounter

## 2024-03-13 ENCOUNTER — Ambulatory Visit: Attending: Cardiology

## 2024-03-13 DIAGNOSIS — I639 Cerebral infarction, unspecified: Secondary | ICD-10-CM

## 2024-03-14 ENCOUNTER — Ambulatory Visit: Payer: Self-pay | Admitting: Cardiology

## 2024-03-14 LAB — CUP PACEART REMOTE DEVICE CHECK
Date Time Interrogation Session: 20251119002220
Implantable Pulse Generator Implant Date: 20250402

## 2024-03-15 NOTE — Progress Notes (Signed)
 Remote Loop Recorder Transmission

## 2024-03-27 ENCOUNTER — Encounter

## 2024-04-03 ENCOUNTER — Encounter: Payer: Self-pay | Admitting: Cardiology

## 2024-04-10 ENCOUNTER — Encounter

## 2024-04-11 ENCOUNTER — Encounter

## 2024-04-13 ENCOUNTER — Ambulatory Visit

## 2024-04-13 DIAGNOSIS — I639 Cerebral infarction, unspecified: Secondary | ICD-10-CM | POA: Diagnosis not present

## 2024-04-14 ENCOUNTER — Ambulatory Visit: Payer: Self-pay | Admitting: Cardiology

## 2024-04-14 LAB — CUP PACEART REMOTE DEVICE CHECK
Date Time Interrogation Session: 20251219234129
Implantable Pulse Generator Implant Date: 20250402

## 2024-04-15 ENCOUNTER — Telehealth: Admitting: Physician Assistant

## 2024-04-15 DIAGNOSIS — J069 Acute upper respiratory infection, unspecified: Secondary | ICD-10-CM | POA: Diagnosis not present

## 2024-04-15 MED ORDER — BENZONATATE 100 MG PO CAPS: 100.0000 mg | ORAL_CAPSULE | Freq: Three times a day (TID) | ORAL | 0 refills | Status: AC | PRN

## 2024-04-15 NOTE — Progress Notes (Signed)

## 2024-04-15 NOTE — Progress Notes (Signed)
 Remote Loop Recorder Transmission

## 2024-05-01 ENCOUNTER — Encounter

## 2024-05-11 ENCOUNTER — Encounter

## 2024-05-14 ENCOUNTER — Ambulatory Visit

## 2024-05-14 DIAGNOSIS — I639 Cerebral infarction, unspecified: Secondary | ICD-10-CM | POA: Diagnosis not present

## 2024-05-15 LAB — CUP PACEART REMOTE DEVICE CHECK
Date Time Interrogation Session: 20260119234239
Implantable Pulse Generator Implant Date: 20250402

## 2024-05-17 NOTE — Progress Notes (Signed)
 Remote Loop Recorder Transmission

## 2024-05-19 ENCOUNTER — Ambulatory Visit: Payer: Self-pay | Admitting: Cardiology

## 2024-06-11 ENCOUNTER — Encounter

## 2024-06-14 ENCOUNTER — Ambulatory Visit

## 2024-07-12 ENCOUNTER — Encounter

## 2024-07-15 ENCOUNTER — Ambulatory Visit

## 2025-02-18 ENCOUNTER — Ambulatory Visit: Admitting: Hematology and Oncology
